# Patient Record
Sex: Female | Born: 1950
Health system: Southern US, Community
[De-identification: ages and names within clinical notes are randomized; demographics above are authoritative.]

## PROBLEM LIST (undated history)

## (undated) DIAGNOSIS — T7840XA Allergy, unspecified, initial encounter: Secondary | ICD-10-CM

## (undated) DIAGNOSIS — D509 Iron deficiency anemia, unspecified: Secondary | ICD-10-CM

## (undated) DIAGNOSIS — E279 Disorder of adrenal gland, unspecified: Secondary | ICD-10-CM

## (undated) DIAGNOSIS — E559 Vitamin D deficiency, unspecified: Secondary | ICD-10-CM

## (undated) DIAGNOSIS — M1990 Unspecified osteoarthritis, unspecified site: Secondary | ICD-10-CM

## (undated) DIAGNOSIS — G4730 Sleep apnea, unspecified: Secondary | ICD-10-CM

## (undated) DIAGNOSIS — H269 Unspecified cataract: Secondary | ICD-10-CM

## (undated) DIAGNOSIS — E7210 Disorders of sulfur-bearing amino-acid metabolism, unspecified: Secondary | ICD-10-CM

## (undated) DIAGNOSIS — B059 Measles without complication: Secondary | ICD-10-CM

## (undated) DIAGNOSIS — B019 Varicella without complication: Secondary | ICD-10-CM

## (undated) DIAGNOSIS — K8020 Calculus of gallbladder without cholecystitis without obstruction: Secondary | ICD-10-CM

## (undated) DIAGNOSIS — M5430 Sciatica, unspecified side: Secondary | ICD-10-CM

## (undated) DIAGNOSIS — S93401D Sprain of unspecified ligament of right ankle, subsequent encounter: Secondary | ICD-10-CM

## (undated) DIAGNOSIS — M539 Dorsopathy, unspecified: Secondary | ICD-10-CM

## (undated) DIAGNOSIS — Z8489 Family history of other specified conditions: Secondary | ICD-10-CM

## (undated) DIAGNOSIS — I10 Essential (primary) hypertension: Secondary | ICD-10-CM

## (undated) DIAGNOSIS — L989 Disorder of the skin and subcutaneous tissue, unspecified: Secondary | ICD-10-CM

## (undated) DIAGNOSIS — B269 Mumps without complication: Secondary | ICD-10-CM

## (undated) DIAGNOSIS — S62319A Displaced fracture of base of unspecified metacarpal bone, initial encounter for closed fracture: Secondary | ICD-10-CM

## (undated) DIAGNOSIS — G4700 Insomnia, unspecified: Secondary | ICD-10-CM

## (undated) DIAGNOSIS — R931 Abnormal findings on diagnostic imaging of heart and coronary circulation: Secondary | ICD-10-CM

## (undated) DIAGNOSIS — E785 Hyperlipidemia, unspecified: Secondary | ICD-10-CM

## (undated) HISTORY — DX: Sprain of unspecified ligament of right ankle, subsequent encounter: S93.401D

## (undated) HISTORY — DX: Dorsopathy, unspecified: M53.9

## (undated) HISTORY — DX: Unspecified cataract: H26.9

## (undated) HISTORY — DX: Vitamin D deficiency, unspecified: E55.9

## (undated) HISTORY — DX: Abnormal findings on diagnostic imaging of heart and coronary circulation: R93.1

## (undated) HISTORY — DX: Measles without complication: B05.9

## (undated) HISTORY — DX: Allergy, unspecified, initial encounter: T78.40XA

## (undated) HISTORY — DX: Displaced fracture of base of unspecified metacarpal bone, initial encounter for closed fracture: S62.319A

## (undated) HISTORY — DX: Calculus of gallbladder without cholecystitis without obstruction: K80.20

## (undated) HISTORY — DX: Essential (primary) hypertension: I10

## (undated) HISTORY — DX: Disorders of sulfur-bearing amino-acid metabolism, unspecified: E72.10

## (undated) HISTORY — DX: Iron deficiency anemia, unspecified: D50.9

## (undated) HISTORY — DX: Unspecified osteoarthritis, unspecified site: M19.90

## (undated) HISTORY — DX: Hyperlipidemia, unspecified: E78.5

## (undated) HISTORY — DX: Sciatica, unspecified side: M54.30

## (undated) HISTORY — DX: Mumps without complication: B26.9

## (undated) HISTORY — PX: NO PAST SURGERIES: SHX2092

## (undated) HISTORY — DX: Disorder of adrenal gland, unspecified: E27.9

## (undated) HISTORY — DX: Insomnia, unspecified: G47.00

## (undated) HISTORY — DX: Disorder of the skin and subcutaneous tissue, unspecified: L98.9

## (undated) HISTORY — DX: Varicella without complication: B01.9

---

## 2012-03-06 ENCOUNTER — Ambulatory Visit (INDEPENDENT_AMBULATORY_CARE_PROVIDER_SITE_OTHER): Payer: BC Managed Care – PPO | Admitting: Gynecology

## 2012-03-06 ENCOUNTER — Encounter: Payer: Self-pay | Admitting: Gynecology

## 2012-03-06 ENCOUNTER — Other Ambulatory Visit (HOSPITAL_COMMUNITY)
Admission: RE | Admit: 2012-03-06 | Discharge: 2012-03-06 | Disposition: A | Discharge status: Home / Self Care | Payer: BC Managed Care – PPO | Source: Ambulatory Visit | Attending: Gynecology | Admitting: Gynecology

## 2012-03-06 VITALS — BP 148/84 | Ht 67.0 in | Wt 172.0 lb

## 2012-03-06 DIAGNOSIS — N643 Galactorrhea not associated with childbirth: Secondary | ICD-10-CM

## 2012-03-06 DIAGNOSIS — Z01419 Encounter for gynecological examination (general) (routine) without abnormal findings: Secondary | ICD-10-CM | POA: Insufficient documentation

## 2012-03-06 DIAGNOSIS — Z13220 Encounter for screening for lipoid disorders: Secondary | ICD-10-CM

## 2012-03-06 DIAGNOSIS — Z780 Asymptomatic menopausal state: Secondary | ICD-10-CM

## 2012-03-06 DIAGNOSIS — Z131 Encounter for screening for diabetes mellitus: Secondary | ICD-10-CM

## 2012-03-06 DIAGNOSIS — Z1151 Encounter for screening for human papillomavirus (HPV): Secondary | ICD-10-CM | POA: Insufficient documentation

## 2012-03-06 LAB — CBC WITH DIFFERENTIAL/PLATELET
Basophils Absolute: 0 10*3/uL (ref 0.0–0.1)
Basophils Relative: 1 % (ref 0–1)
Eosinophils Relative: 3 % (ref 0–5)
HCT: 39.5 % (ref 36.0–46.0)
Hemoglobin: 13.2 g/dL (ref 12.0–15.0)
MCH: 28.2 pg (ref 26.0–34.0)
MCHC: 33.4 g/dL (ref 30.0–36.0)
MCV: 84.4 fL (ref 78.0–100.0)
Monocytes Absolute: 0.3 10*3/uL (ref 0.1–1.0)
Monocytes Relative: 8 % (ref 3–12)
RDW: 13.6 % (ref 11.5–15.5)

## 2012-03-06 LAB — COMPREHENSIVE METABOLIC PANEL
AST: 205 U/L — ABNORMAL HIGH (ref 0–37)
Albumin: 4.6 g/dL (ref 3.5–5.2)
Alkaline Phosphatase: 277 U/L — ABNORMAL HIGH (ref 39–117)
BUN: 11 mg/dL (ref 6–23)
Calcium: 9.7 mg/dL (ref 8.4–10.5)
Creat: 0.78 mg/dL (ref 0.50–1.10)
Glucose, Bld: 126 mg/dL — ABNORMAL HIGH (ref 70–99)
Potassium: 3.7 mEq/L (ref 3.5–5.3)

## 2012-03-06 LAB — LIPID PANEL
Cholesterol: 215 mg/dL — ABNORMAL HIGH (ref 0–200)
HDL: 75 mg/dL (ref >39)
Triglycerides: 131 mg/dL (ref <150)

## 2012-03-06 NOTE — Patient Instructions (Signed)
Office will call you to arrange mammogram/ultrasound. Schedule screening colonoscopy. Follow up in one year for annual gynecologic exam.

## 2012-03-06 NOTE — Progress Notes (Signed)
Melinda [name] [birth year] [id number]        61 y.o.  G2P2 new patient for annual exam.  Former patient of Dr. Angeline Slim. Several issues noted below.  Past medical history,surgical history, medications, allergies, family history and social history were all reviewed and documented in the EPIC chart. ROS:  Was performed and pertinent positives and negatives are included in the history.  Exam: Sherrilyn Rist assistant Filed Vitals:   03/06/12 1541  BP: 148/84  Height: 5\' 7"  (1.702 m)  Weight: 172 lb (78.019 kg)   General appearance  Normal Skin grossly normal Head/Neck normal with no cervical or supraclavicular adenopathy thyroid normal Lungs  clear Cardiac RR, without RMG Abdominal  soft, nontender, without masses, organomegaly or hernia Breasts  examined lying and sitting. Right without masses, retractions, discharge or axillary adenopathy. Left with clear galactorrhea, no masses retractions adenopathy. Pelvic  Ext/BUS/vagina  normal   Cervix  normal Pap/HPV  Uterus  axial, normal size, shape and contour, midline and mobile nontender   Adnexa  Without masses or tenderness    Anus and perineum  normal   Rectovaginal  normal sphincter tone without palpated masses or tenderness.    Assessment/Plan:  61 y.o. G2P2 female for annual exam.   1. Postmenopausal. Without bleeding in over 2 years. Initially with menopausal symptoms but these seemed to have almost resolved. Patient will monitor present as they're not overly bothersome to her. 2. History of left intraductal defect on ultrasound 2005. Patient had mammogram/ultrasound was found to have a dilated duct with an intra-ductal defect most compatible with an intraductal papilloma. She was counseled for excision and declined it was recommended for repeat ultrasound in 3-6 months which she never followed up on. I reviewed the issues and risks to include intraductal carcinoma and strongly recommended she schedule a mammogram ultrasound now and she  agrees we will make these arrangements with her. She does have clear galactorrhea from the left nipple which she has had for a number of years and I am going to also get a baseline prolactin.  The absolute need for follow up reviewed. 3. Pap smear. Pap/HPV screen done today. Last Pap smear 2011. No history of abnormal Pap smears before. Assuming this Pap normal then we'll plan every 5 year screening. 4. Colonoscopy. Patient has never had a colonoscopy. I reviewed current screening guidelines and strongly recommended she schedule this I gave her names of facilities in [place]. 5. DEXA. Patient underwent menopause around age 61. Plan Baseline DEXA age 60-65. Increase calcium vitamin D reviewed. 6. Dark urine. Patient reports transient dark urine. Now reportedly normal. Check baseline urinalysis comprehensive metabolic panel for renal function/bilirubin. 7. Elevated blood pressure.  Blood pressure 148/84. Patient reports white coat syndrome with elevated blood pressures at doctor's offices. I recommended recheck in a non-exam situation, as long as normal will follow. If it would remain elevated the need for follow up discussed. 8. Health maintenance.  Patient does not have a primary physician. I gave her the name of several physicians in [place]. Will check baseline CBC lipid profile comprehensive metabolic panel TSH vitamin D and urinalysis.  Patient will follow up in one year, sooner as needed.    Dara Lords MD, 4:16 PM 03/06/2012

## 2012-03-07 ENCOUNTER — Telehealth: Payer: Self-pay | Admitting: Gynecology

## 2012-03-07 DIAGNOSIS — K759 Inflammatory liver disease, unspecified: Secondary | ICD-10-CM

## 2012-03-07 LAB — URINALYSIS W MICROSCOPIC + REFLEX CULTURE
Bilirubin Urine: NEGATIVE
Casts: NONE SEEN
Crystals: NONE SEEN
Ketones, ur: NEGATIVE mg/dL
Nitrite: NEGATIVE
Specific Gravity, Urine: 1.011 (ref 1.005–1.030)
Urobilinogen, UA: 0.2 mg/dL (ref 0.0–1.0)
pH: 6.5 (ref 5.0–8.0)

## 2012-03-07 NOTE — Telephone Encounter (Signed)
Call patient: #1 glucose value slightly elevated could be consistent with early diabetes or just random. Recommend repeat a fasting glucose. #2 cholesterol and LDL are borderline elevated. Recommend repeating a fasting value when she does a fasting glucose. #3 liver functions are elevated which is more concerning. Possibilities include hepatitis, gallbladder disease or other issues. This definitely needs to be followed up. I recommend she come back in to have a repeat of her liver functions, viral hepatitis blood tests and to schedule her for an ultrasound of the liver and gallbladder. Ultimately she probably will need to see a gastroenterologist for further evaluation but I wanted these baseline studies. Have her come back in for the blood work and schedule her an ultrasound of the liver/gallbladder reference elevated liver function studies.  She can come in fasting and we can do all 3 of the above studies.

## 2012-03-08 ENCOUNTER — Telehealth: Payer: Self-pay | Admitting: *Deleted

## 2012-03-08 DIAGNOSIS — N643 Galactorrhea not associated with childbirth: Secondary | ICD-10-CM

## 2012-03-08 LAB — URINE CULTURE

## 2012-03-08 NOTE — Telephone Encounter (Signed)
Orders placed in computer system.

## 2012-03-08 NOTE — Telephone Encounter (Signed)
Spoke with pt regarding the below, orders placed for repeat glucose and lipid. Pt will call back to check with insurance to scheduled ultrasound.

## 2012-03-08 NOTE — Telephone Encounter (Signed)
Message copied by Aura Camps on Thu Mar 08, 2012 11:50 AM ------      Message from: Libby Maw      Created: Tue Mar 06, 2012  4:31 PM                   ----- Message -----         From: Dara Lords, MD         Sent: 03/06/2012   4:27 PM           To: Amy Duwaine Maxin, CNA            Schedule diagnostic mammogram and ultrasound at breast center reference prior left dilated duct with solid filling defect 2005 with no follow up since.  Clear galactorrhea from that nipple. Mammogram report from [place] sent to [facility] for scanning in. Recommend sending report to breast Center when scheduling so they can see what was described.

## 2012-03-20 NOTE — Telephone Encounter (Signed)
Pt breast center appointment on 03/26/12 @ 1:10 pm

## 2012-03-26 ENCOUNTER — Ambulatory Visit
Admission: RE | Admit: 2012-03-26 | Discharge: 2012-03-26 | Disposition: A | Discharge status: Home / Self Care | Payer: BC Managed Care – PPO | Source: Ambulatory Visit | Attending: Gynecology | Admitting: Gynecology

## 2012-03-26 ENCOUNTER — Other Ambulatory Visit: Payer: Self-pay | Admitting: Gynecology

## 2012-03-26 DIAGNOSIS — N643 Galactorrhea not associated with childbirth: Secondary | ICD-10-CM

## 2012-03-30 ENCOUNTER — Other Ambulatory Visit: Payer: BC Managed Care – PPO

## 2012-03-30 DIAGNOSIS — K759 Inflammatory liver disease, unspecified: Secondary | ICD-10-CM

## 2012-03-30 LAB — COMPREHENSIVE METABOLIC PANEL
CO2: 27 mEq/L (ref 19–32)
Calcium: 9.5 mg/dL (ref 8.4–10.5)
Creat: 0.76 mg/dL (ref 0.50–1.10)
Glucose, Bld: 81 mg/dL (ref 70–99)
Sodium: 139 mEq/L (ref 135–145)
Total Bilirubin: 0.9 mg/dL (ref 0.3–1.2)
Total Protein: 7 g/dL (ref 6.0–8.3)

## 2012-03-30 LAB — LIPID PANEL
Cholesterol: 214 mg/dL — ABNORMAL HIGH (ref 0–200)
Triglycerides: 89 mg/dL (ref <150)
VLDL: 18 mg/dL (ref 0–40)

## 2012-03-31 LAB — HEPATITIS A ANTIBODY, IGM: Hep A IgM: NEGATIVE

## 2012-04-02 ENCOUNTER — Other Ambulatory Visit: Payer: Self-pay | Admitting: Gynecology

## 2012-04-02 DIAGNOSIS — E7800 Pure hypercholesterolemia, unspecified: Secondary | ICD-10-CM

## 2012-04-20 ENCOUNTER — Encounter: Payer: Self-pay | Admitting: Gynecology

## 2012-05-09 ENCOUNTER — Telehealth: Payer: Self-pay | Admitting: *Deleted

## 2012-05-09 NOTE — Telephone Encounter (Signed)
I called patient regarding an abnormal diagnostic mammo and ultrasound that revealed she needed a breast biopsy. The patient was informed by the Breast Center for the need and never scheduled. We are following up with the patient since a biopsy has never been scheduled. Melinda [name] stated to me that she does not feel the biopsy is needed and is not going to proceed with the biopsy. I asked her if she informed the Breast Center she said yes but doesn't have any follow up set up. Please advise.

## 2012-05-14 ENCOUNTER — Telehealth: Payer: Self-pay | Admitting: *Deleted

## 2012-05-14 NOTE — Telephone Encounter (Signed)
Message copied by Aura Camps on Mon May 14, 2012  2:45 PM ------      Message from: Dara Lords      Created: Mon May 14, 2012  1:02 PM       Get patient on phone for me please and if I am in with a patient get me out of the room.

## 2012-05-14 NOTE — Telephone Encounter (Signed)
Left message on pt voicemail to call 

## 2012-05-15 NOTE — Telephone Encounter (Signed)
Pt called about at lunch time, left message to  call back.

## 2012-05-15 NOTE — Telephone Encounter (Signed)
Got pt on phone for TF.

## 2012-05-15 NOTE — Telephone Encounter (Signed)
Patient had mammogram in August 2013 which showed:  Intraductal mass with calcifications in the 9 o'clock position of the left breast, 2 cm from the left nipple.  BI-RADS CATEGORY 4: Suspicious abnormality - biopsy should be considered.  We subsequently tried to call her several times to make sure that a biopsy was arranged and were unable to get her on the phone. We contacted the mammogram facility and they also were unable to contact her. We sent her a certified letter signed for and documented in the Epic system reviewing the findings that she may have breast cancer and that we recommended she follow up for the biopsy.  Melinda [name] followed up with a phone call 05/09/2012 again reviewing the findings and our recommendation for biopsy. The patient stated that she does not feel that she needed a biopsy.   I personally called her today and reviewed the situation and my recommendation that she have the biopsy. She clearly understands that the mammogram suggests breast cancer and a biopsy is needed to rule this in or out and that if indeed she does have breast cancer this needs to be treated as early treatment could lead to a cure and if she postpones this she could die from the breast cancer. She is using a mineral supplement called Miracle Mineral Solution and she is convinced that she does not have cancer because she is not having a reaction to the solution.  The patient clearly understands the mammogram findings and the recommended need for biopsy and she refuses this. I asked her to call me at any time if she changes her mind or has any questions related to this.

## 2012-05-23 ENCOUNTER — Ambulatory Visit (INDEPENDENT_AMBULATORY_CARE_PROVIDER_SITE_OTHER): Payer: BC Managed Care – PPO | Admitting: Gynecology

## 2012-05-23 ENCOUNTER — Encounter: Payer: Self-pay | Admitting: Gynecology

## 2012-05-23 DIAGNOSIS — R82998 Other abnormal findings in urine: Secondary | ICD-10-CM

## 2012-05-23 DIAGNOSIS — R1011 Right upper quadrant pain: Secondary | ICD-10-CM

## 2012-05-23 DIAGNOSIS — R3989 Other symptoms and signs involving the genitourinary system: Secondary | ICD-10-CM

## 2012-05-23 NOTE — Patient Instructions (Signed)
Office will contact you to schedule ultrasound of the liver area. Office will follow up with you in reference to these results and the blood work.

## 2012-05-23 NOTE — Progress Notes (Signed)
Patient presents with 4 separate episodes of midepigastric pain, nausea with episode of vomiting following eating over the past several weeks. History of elevated LFTs on comprehensive metabolic panel in July with repeat normal.  No lower GI symptoms such as constipation diarrhea or lower abdominal pain. Also notes that her urine seems to be on the darker side.  Had recently had mammogram in August which showed area they recommended biopsy. As per 10/8 2013 note patient declined recommendation from the breast Center, our office staff and me personally to proceed with biopsy.  Exam Spine straight without CVA tenderness Abdomen with mild epigastric discomfort to deep palpation. No rebound guarding masses organomegaly. No right upper quadrant liver palpable or pain. Active bowel sounds throughout.  Assessment and plan: 1. Epigastric discomfort following meals with pain/nausea and vomiting. Recent mild elevated LFTs repeat normal. Suspect gallbladder disease. We'll start with repeat comprehensive metabolic panel today and ultrasound right upper quadrant area various scenarios reviewed. Gallstones polyps or other abnormalities recommend general surgical follow up. Patient did state that she would not have her gallbladder removed that she would prefer dietary changes. I reviewed the risks to include gangrenous cholecystitis in the emergency nature of this and we'll further discuss pending the ultrasound blood results. If normal recommend follow up with gastroenterology. Possible reflux esophagitis or other GI abnormalities discussed. 2. Suspicious mammogram biopsy recommended. I again reviewed this with the patient and she again clearly understands the issues/implications and refuses further follow up. This again has been outlined in my 05/15/2012 note.

## 2012-05-24 ENCOUNTER — Telehealth: Payer: Self-pay | Admitting: *Deleted

## 2012-05-24 DIAGNOSIS — R1013 Epigastric pain: Secondary | ICD-10-CM

## 2012-05-24 LAB — URINALYSIS W MICROSCOPIC + REFLEX CULTURE
Bacteria, UA: NONE SEEN
Casts: NONE SEEN
Crystals: NONE SEEN
Ketones, ur: NEGATIVE mg/dL
Nitrite: NEGATIVE
Specific Gravity, Urine: 1.008 (ref 1.005–1.030)
pH: 7.5 (ref 5.0–8.0)

## 2012-05-24 LAB — COMPREHENSIVE METABOLIC PANEL
ALT: 567 U/L — ABNORMAL HIGH (ref 0–35)
BUN: 8 mg/dL (ref 6–23)
CO2: 29 mEq/L (ref 19–32)
Creat: 0.74 mg/dL (ref 0.50–1.10)
Total Bilirubin: 6.3 mg/dL — ABNORMAL HIGH (ref 0.3–1.2)

## 2012-05-24 NOTE — Telephone Encounter (Signed)
Left message for pt to call. Ultrasound appointment at [facility] hospital 05/29/12 @ 8:00 am pt must be NPO after midnight.

## 2012-05-24 NOTE — Telephone Encounter (Signed)
Message copied by Aura Camps on Thu May 24, 2012  8:50 AM ------      Message from: Dara Lords      Created: Wed May 23, 2012  4:49 PM       Schedule right upper quadrant ultrasound reference epigastric/right upper quadrant pain suspicious for gallbladder disease. History of elevated LFTs

## 2012-05-25 ENCOUNTER — Ambulatory Visit (HOSPITAL_COMMUNITY)
Admission: RE | Admit: 2012-05-25 | Discharge: 2012-05-25 | Disposition: A | Discharge status: Home / Self Care | Payer: BC Managed Care – PPO | Source: Ambulatory Visit | Attending: Gynecology | Admitting: Gynecology

## 2012-05-25 ENCOUNTER — Telehealth: Payer: Self-pay | Admitting: Gynecology

## 2012-05-25 DIAGNOSIS — R1013 Epigastric pain: Secondary | ICD-10-CM

## 2012-05-25 DIAGNOSIS — R112 Nausea with vomiting, unspecified: Secondary | ICD-10-CM | POA: Insufficient documentation

## 2012-05-25 DIAGNOSIS — K8020 Calculus of gallbladder without cholecystitis without obstruction: Secondary | ICD-10-CM | POA: Insufficient documentation

## 2012-05-25 LAB — URINE CULTURE: Colony Count: 35000

## 2012-05-25 NOTE — Telephone Encounter (Signed)
Read the report to the pt and she stated " Im going to hold off on making appointment with general surgeon doctor".

## 2012-05-25 NOTE — Telephone Encounter (Signed)
Pt informed with the below note, pt asked how many gallstones does she have? What are the size?

## 2012-05-25 NOTE — Telephone Encounter (Signed)
Pt informed with the below note. 

## 2012-05-25 NOTE — Telephone Encounter (Signed)
Tell patient that her ultrasound does confirm gallstones and her liver functions and bilirubin are elevated. Recommend that she see Gen. Surgery to at least hear what is involved with cholecystectomy. As I discussed at her office visit if she does not have this removed she will continue to have attacks which could lead to a gangrenous gallbladder which could be life-threatening.  Schedule an appointment with Gen. Surgery and encourage the patient to follow up for this.

## 2012-05-25 NOTE — Telephone Encounter (Signed)
Read her the report.

## 2012-05-29 ENCOUNTER — Ambulatory Visit (HOSPITAL_COMMUNITY): Payer: BC Managed Care – PPO

## 2014-06-09 ENCOUNTER — Encounter: Payer: Self-pay | Admitting: Gynecology

## 2015-10-24 ENCOUNTER — Ambulatory Visit: Payer: Worker's Compensation

## 2015-10-24 ENCOUNTER — Ambulatory Visit (INDEPENDENT_AMBULATORY_CARE_PROVIDER_SITE_OTHER): Payer: Worker's Compensation | Admitting: Family Medicine

## 2015-10-24 VITALS — BP 134/76 | HR 65 | Temp 97.6°F | Resp 12 | Ht 67.0 in | Wt 172.0 lb

## 2015-10-24 DIAGNOSIS — M25562 Pain in left knee: Secondary | ICD-10-CM

## 2015-10-24 DIAGNOSIS — M25572 Pain in left ankle and joints of left foot: Secondary | ICD-10-CM | POA: Diagnosis not present

## 2015-10-24 DIAGNOSIS — M79672 Pain in left foot: Secondary | ICD-10-CM

## 2015-10-24 DIAGNOSIS — S82832A Other fracture of upper and lower end of left fibula, initial encounter for closed fracture: Secondary | ICD-10-CM

## 2015-10-24 NOTE — Patient Instructions (Addendum)
IF you received an x-ray today, you will receive an invoice from [facility]. Please contact [facility] at [phone] with questions or concerns regarding your invoice.   IF you received labwork today, you will receive an invoice from [employer]. Please contact [facility] at [phone] with questions or concerns regarding your invoice.   Our billing staff will not be able to assist you with questions regarding bills from these companies.  You will be contacted with the lab results as soon as they are available. The fastest way to get your results is to activate your My Chart account. Instructions are located on the last page of this paperwork. If you have not heard from [place] regarding the results in 2 weeks, please contact this office.     On your x-rays, there did not appear to be any acute fractures, but as we discussed, sometimes there is a subtle fracture not seen on initial exam. Based on the location of your pain in the foot, should wear the walking boot, crutches if needed if pain with weightbearing, and follow-up in 3 days to recheck.   You did have an avulsion or slight fracture from the outside anklebone that appears to be from previous injury. Depending on your symptoms from current injury, you may need to be seen by an orthopedic surgeon. Follow-up in 3 days as planned.   In the meantime, see information below on treatment for acute injuries. Okay to use Tylenol or Advil if needed. Ice and elevate for the first few days - ice on and off for 10 minutes at a time.   Ankle Sprain An ankle sprain is an injury to the strong, fibrous tissues (ligaments) that hold the bones of your ankle joint together.  CAUSES An ankle sprain is usually caused by a fall or by twisting your ankle. Ankle sprains most commonly occur when you step on the outer edge of your foot, and your ankle turns inward. People who participate in sports are more prone to  these types of injuries.  SYMPTOMS   Pain in your ankle. The pain may be present at rest or only when you are trying to stand or walk.  Swelling.  Bruising. Bruising may develop immediately or within 1 to 2 days after your injury.  Difficulty standing or walking, particularly when turning corners or changing directions. DIAGNOSIS  Your caregiver will ask you details about your injury and perform a physical exam of your ankle to determine if you have an ankle sprain. During the physical exam, your caregiver will press on and apply pressure to specific areas of your foot and ankle. Your caregiver will try to move your ankle in certain ways. An X-ray exam may be done to be sure a bone was not broken or a ligament did not separate from one of the bones in your ankle (avulsion fracture).  TREATMENT  Certain types of braces can help stabilize your ankle. Your caregiver can make a recommendation for this. Your caregiver may recommend the use of medicine for pain. If your sprain is severe, your caregiver may refer you to a surgeon who helps to restore function to parts of your skeletal system (orthopedist) or a physical therapist. [place] ice to your injury for 1-2 days or as directed by your caregiver. Applying ice helps to reduce inflammation and pain.  Put ice in a plastic bag.  Place a towel between your skin and the bag.  Leave  the ice on for 15-20 minutes at a time, every 2 hours while you are awake.  Only take over-the-counter or prescription medicines for pain, discomfort, or fever as directed by your caregiver.  Elevate your injured ankle above the level of your heart as much as possible for 2-3 days.  If your caregiver recommends crutches, use them as instructed. Gradually put weight on the affected ankle. Continue to use crutches or a cane until you can walk without feeling pain in your ankle.  If you have a plaster splint, wear the splint as directed by your  caregiver. Do not rest it on anything harder than a pillow for the first 24 hours. Do not put weight on it. Do not get it wet. You may take it off to take a shower or bath.  You may have been given an elastic bandage to wear around your ankle to provide support. If the elastic bandage is too tight (you have numbness or tingling in your foot or your foot becomes cold and blue), adjust the bandage to make it comfortable.  If you have an air splint, you may blow more air into it or let air out to make it more comfortable. You may take your splint off at night and before taking a shower or bath. Wiggle your toes in the splint several times per day to decrease swelling. SEEK MEDICAL CARE IF:   You have rapidly increasing bruising or swelling.  Your toes feel extremely cold or you lose feeling in your foot.  Your pain is not relieved with medicine. SEEK IMMEDIATE MEDICAL CARE IF:  Your toes are numb or blue.  You have severe pain that is increasing. MAKE SURE YOU:   Understand these instructions.  Will watch your condition.  Will get help right away if you are not doing well or get worse.   This information is not intended to replace advice given to you by your health care provider. Make sure you discuss any questions you have with your health care provider.   Document Released: 07/25/2005 Document Revised: 08/15/2014 Document Reviewed: 08/06/2011 Elsevier Interactive Patient Education 2016 [place] for Routine Care of Injuries Theroutine careofmanyinjuriesincludes rest, ice, compression, and elevation (RICE therapy). RICE therapy is often recommended for injuries to soft tissues, such as a muscle strain, ligament injuries, bruises, and overuse injuries. It can also be used for some bony injuries. Using RICE therapy can help to relieve pain, lessen swelling, and enable your body to heal. Rest Rest is required to allow your body to heal. This usually involves reducing your  normal activities and avoiding use of the injured part of your body. Generally, you can return to your normal activities when you are comfortable and have been given permission by your health care provider. Ice Icing your injury helps to keep the swelling down, and it lessens pain. Do not apply ice directly to your skin.  Put ice in a plastic bag.  Place a towel between your skin and the bag.  Leave the ice on for 20 minutes, 2-3 times a day. Do this for as long as you are directed by your health care provider. Compression Compression means putting pressure on the injured area. Compression helps to keep swelling down, gives support, and helps with discomfort. Compression may be done with an elastic bandage. If an elastic bandage has been applied, follow these general tips:  Remove and reapply the bandage every 3-4 hours or as directed by your health care provider.  Make sure the bandage is not wrapped too tightly, because this can cut off circulation. If part of your body beyond the bandage becomes blue, numb, cold, swollen, or more painful, your bandage is most likely too tight. If this occurs, remove your bandage and reapply it more loosely.  See your health care provider if the bandage seems to be making your problems worse rather than better. Elevation Elevation means keeping the injured area raised. This helps to lessen swelling and decrease pain. If possible, your injured area should be elevated at or above the level of your heart or the center of your chest. [place]? You should seek medical care if:  Your pain and swelling continue.  Your symptoms are getting worse rather than improving. These symptoms may indicate that further evaluation or further X-rays are needed. Sometimes, X-rays may not show a small broken bone (fracture) until a number of days later. Make a follow-up appointment with your health care provider. WHEN SHOULD I SEEK IMMEDIATE MEDICAL  CARE? You should seek immediate medical care if:  You have sudden severe pain at or below the area of your injury.  You have redness or increased swelling around your injury.  You have tingling or numbness at or below the area of your injury that does not improve after you remove the elastic bandage.   This information is not intended to replace advice given to you by your health care provider. Make sure you discuss any questions you have with your health care provider.   Document Released: 11/06/2000 Document Revised: 04/15/2015 Document Reviewed: 07/02/2014 Elsevier Interactive Patient Education [employer].

## 2015-10-24 NOTE — Progress Notes (Addendum)
Subjective:    Patient ID: Melinda [name], female    DOB: [birth year], 65 y.o.   MRN: [id number] By signing my name below, I, Judithe Modest, attest that this documentation has been prepared under the direction and in the presence of [name], MD. Electronically Signed: Judithe Modest, ER Scribe. 10/24/2015. 12:21 PM.  Chief Complaint  Patient presents with  . Ankle Injury    left ankle injury yesterday sliped on water    HPI HPI Comments: Melinda [name] is a 65 y.o. female who presents to [facility] complaining of a left ankle injury at work yesterday. She slipped on a wet floor and fell down onto her left knee and bent her toes back. In December she sprained her left ankle, and she was not 100% healed when she had her injury yesterday, though she was very close. She was having occasional mild pain in her ankle before yesterday. She was able to do all her normal activities, other than run. She tried running one month ago but had some soreness after she ran, so she was giving it a little more time to heal before she ran regularly again. She had two [place] treatments and several acupuncture treatments for the anke sprain.   There are no active problems to display for this patient.  History reviewed. No pertinent past medical history. History reviewed. No pertinent past surgical history. No Known Allergies Prior to Admission medications   Medication Sig Start Date End Date Taking? Authorizing Provider  cholecalciferol (VITAMIN D) 1000 UNITS tablet Take 1,000 Units by mouth daily.   Yes Historical Provider, MD  Docosahexaenoic Acid (DHA OMEGA 3 PO) Take by mouth.   Yes Historical Provider, MD  Multiple Vitamin (MULTIVITAMIN) tablet Take 1 tablet by mouth daily.   Yes Historical Provider, MD  Nutritional Supplements (DETOX ENZYME FORMULA PO) Take by mouth. Prn   Yes Historical Provider, MD   Social History   Social History  . Marital Status: Married    Spouse Name: N/A  . Number  of Children: N/A  . Years of Education: N/A   Occupational History  . Not on file.   Social History Main Topics  . Smoking status: Never Smoker   . Smokeless tobacco: Never Used  . Alcohol Use: No  . Drug Use: No  . Sexual Activity: No   Other Topics Concern  . Not on file   Social History Narrative    Review of Systems  Constitutional: Negative for fever and chills.  Musculoskeletal: Positive for arthralgias and gait problem.  Neurological: Positive for weakness (Secondary to pain). Negative for numbness.      Objective:  BP 134/76 mmHg  Pulse 65  Temp(Src) 97.6 F (36.4 C) (Oral)  Resp 12  Ht 5\' 7"  (1.702 m)  Wt 172 lb (78.019 kg)  BMI 26.93 kg/m2  SpO2 98%  LMP 08/04/2009  Physical Exam  Constitutional: She is oriented to person, place, and time. She appears well-developed and well-nourished. No distress.  HENT:  Head: Normocephalic and atraumatic.  Eyes: Pupils are equal, round, and reactive to light.  Neck: Neck supple.  Cardiovascular: Normal rate.   Pulmonary/Chest: Effort normal. No respiratory distress.  Musculoskeletal: Normal range of motion. She exhibits edema and tenderness.  Left foot with full ROM. TTP along tibial tubercle with faint ecchymosis but skin is intact. No effusion of the knee. Negative varis and valgus. Negative mcmurrays. Negative Lachman. Slight discomfort over the lower medial malleolus. Navicula non tender.  TTP along proximal fifth metatarsal. Slight discomfort over the distal aspect of the first metatarsal. Minimal discomfort over the metatarsal heads. Tender along the first MTP and great toe head. Ecchymosis along the distal foot and the great toe through the fifth toes. DP pulse 2+. NVI distally cap refill less than two seconds.   Neurological: She is alert and oriented to person, place, and time. Coordination normal.  Skin: Skin is warm and dry. She is not diaphoretic.  Psychiatric: She has a normal mood and affect. Her behavior is  normal.  Nursing note and vitals reviewed. Uncomfortable, but able to extend and flex toes independently on left foot.    Dg Knee 1-2 Views Left  10/24/2015  CLINICAL DATA:  Pain following fall 1 day prior EXAM: LEFT KNEE - 1-2 VIEW COMPARISON:  None. FINDINGS: Frontal and lateral views were obtained. There is no fracture or dislocation. There is equivocal medial patellar subluxation. No appreciable joint effusion. The joint spaces appear normal. No erosive change. IMPRESSION: No apparent fracture or joint effusion. Rather equivocal medial patellar subluxation. No appreciable arthropathic change. Electronically Signed   By: Lowella Grip III M.D.   On: 10/24/2015 12:58   Dg Ankle Complete Left  10/24/2015  CLINICAL DATA:  Pain after trauma EXAM: LEFT ANKLE COMPLETE - 3+ VIEW COMPARISON:  None FINDINGS: Mild irregularity of the distal fibula without overlying soft tissue swelling. No other abnormalities. IMPRESSION: Probable remote avulsion injury off the distal fibula. The lack of overlying soft tissue swelling would argue against acute injury in this region. No other acute abnormalities. Electronically Signed   By: Dorise Bullion III M.D   On: 10/24/2015 13:00   Dg Foot Complete Left  10/24/2015  CLINICAL DATA:  Pain following fall 1 day prior EXAM: LEFT FOOT - COMPLETE 3+ VIEW COMPARISON:  None. FINDINGS: Frontal, oblique, and lateral views were obtained. There is no fracture or dislocation. The joint spaces appear unremarkable. No erosive change. There is a spur arising from the posterior calcaneus. There is mild pes cavus. IMPRESSION: No fracture or dislocation. No appreciable joint space narrowing. Mild pes cavus. There is a posterior calcaneal spur. Electronically Signed   By: Lowella Grip III M.D.   On: 10/24/2015 12:57        Assessment & Plan:  Melinda [name] is a 65 y.o. female Left foot pain - Plan: DG Foot Complete Left, Apply cam walker  Left ankle pain - Plan: DG Ankle  Complete Left, Apply cam walker  Left knee pain - Plan: DG Knee 1-2 Views Left  Closed avulsion fracture of distal fibula, left, initial encounter  Previous ankle sprain with probable fibula avulsion fracture from previous injury, now with new injury at work yesterday with slip and fall, Contusion to anterior left knee, bruising of foot and tenderness over fifth metatarsal concerning for possible occult fracture, versus occult midfoot fracture.   -Initially attempted to place an tall Cam Walker based on her injury, but she was not able to tolerate this, stating that the strapping and structure across the top of her foot was too uncomfortable. Did try postop shoe but discussed this would not provide the stability that the Cam Walker with. Again discussed if any pain with weightbearing, should be on crutches and nonweightbearing until follow-up visit. If she is pain-free with walking in postop shoe, okay to use this until follow-up visit.   - Ice and elevation, and recheck in 6 days. If she would like to try to schedule  appointment with me on Wednesday, I can see her if one is available. Otherwise can follow-up with any provider next visit to recheck ankle/foot.  Over-the-counter Tylenol or Advil if needed, work restrictions provided. RTC precautions discussed.  -With her ankle pain, and previous injury, may need to follow-up with her primary care provider or ortho separate from current work-related injury.    At end of visit, she stated that her schedule will actually not tolerate returning Friday, will be able to return in 3 days. Follow-up with me in 3 days. Paperwork changed.    No orders of the defined types were placed in this encounter.   Patient Instructions       IF you received an x-ray today, you will receive an invoice from [facility]. Please contact [facility] at [phone] with questions or concerns regarding your invoice.   IF you received labwork today,  you will receive an invoice from [employer]. Please contact [facility] at [phone] with questions or concerns regarding your invoice.   Our billing staff will not be able to assist you with questions regarding bills from these companies.  You will be contacted with the lab results as soon as they are available. The fastest way to get your results is to activate your My Chart account. Instructions are located on the last page of this paperwork. If you have not heard from [place] regarding the results in 2 weeks, please contact this office.     On your x-rays, there did not appear to be any acute fractures, but as we discussed, sometimes there is a subtle fracture not seen on initial exam. Based on the location of your pain in the foot, should wear the walking boot, crutches if needed if pain with weightbearing, and follow-up in 6 days to recheck. You can call Next Tuesday or Wednesday to see if there are any openings in my schedule for appointments on Wednesday, but if not, follow-up with any provider next Thursday or Friday at the latest.  You did have an avulsion or slight fracture from the outside anklebone that appears to be from previous injury. Depending on your symptoms from current injury, you may need to be seen by an orthopedic surgeon. Follow-up in 5 days as planned.   In the meantime, see information below on treatment for acute injuries. Okay to use Tylenol or Advil if needed. Ice and elevate for the first few days - ice on and off for 10 minutes at a time.   Ankle Sprain An ankle sprain is an injury to the strong, fibrous tissues (ligaments) that hold the bones of your ankle joint together.  CAUSES An ankle sprain is usually caused by a fall or by twisting your ankle. Ankle sprains most commonly occur when you step on the outer edge of your foot, and your ankle turns inward. People who participate in sports are more prone to these types of injuries.  SYMPTOMS     Pain in your ankle. The pain may be present at rest or only when you are trying to stand or walk.  Swelling.  Bruising. Bruising may develop immediately or within 1 to 2 days after your injury.  Difficulty standing or walking, particularly when turning corners or changing directions. DIAGNOSIS  Your caregiver will ask you details about your injury and perform a physical exam of your ankle to determine if you have an ankle sprain. During the physical exam, your caregiver will press on and apply pressure to specific  areas of your foot and ankle. Your caregiver will try to move your ankle in certain ways. An X-ray exam may be done to be sure a bone was not broken or a ligament did not separate from one of the bones in your ankle (avulsion fracture).  TREATMENT  Certain types of braces can help stabilize your ankle. Your caregiver can make a recommendation for this. Your caregiver may recommend the use of medicine for pain. If your sprain is severe, your caregiver may refer you to a surgeon who helps to restore function to parts of your skeletal system (orthopedist) or a physical therapist. [place] ice to your injury for 1-2 days or as directed by your caregiver. Applying ice helps to reduce inflammation and pain.  Put ice in a plastic bag.  Place a towel between your skin and the bag.  Leave the ice on for 15-20 minutes at a time, every 2 hours while you are awake.  Only take over-the-counter or prescription medicines for pain, discomfort, or fever as directed by your caregiver.  Elevate your injured ankle above the level of your heart as much as possible for 2-3 days.  If your caregiver recommends crutches, use them as instructed. Gradually put weight on the affected ankle. Continue to use crutches or a cane until you can walk without feeling pain in your ankle.  If you have a plaster splint, wear the splint as directed by your caregiver. Do not rest it on anything  harder than a pillow for the first 24 hours. Do not put weight on it. Do not get it wet. You may take it off to take a shower or bath.  You may have been given an elastic bandage to wear around your ankle to provide support. If the elastic bandage is too tight (you have numbness or tingling in your foot or your foot becomes cold and blue), adjust the bandage to make it comfortable.  If you have an air splint, you may blow more air into it or let air out to make it more comfortable. You may take your splint off at night and before taking a shower or bath. Wiggle your toes in the splint several times per day to decrease swelling. SEEK MEDICAL CARE IF:   You have rapidly increasing bruising or swelling.  Your toes feel extremely cold or you lose feeling in your foot.  Your pain is not relieved with medicine. SEEK IMMEDIATE MEDICAL CARE IF:  Your toes are numb or blue.  You have severe pain that is increasing. MAKE SURE YOU:   Understand these instructions.  Will watch your condition.  Will get help right away if you are not doing well or get worse.   This information is not intended to replace advice given to you by your health care provider. Make sure you discuss any questions you have with your health care provider.   Document Released: 07/25/2005 Document Revised: 08/15/2014 Document Reviewed: 08/06/2011 Elsevier Interactive Patient Education 2016 [place] for Routine Care of Injuries Theroutine careofmanyinjuriesincludes rest, ice, compression, and elevation (RICE therapy). RICE therapy is often recommended for injuries to soft tissues, such as a muscle strain, ligament injuries, bruises, and overuse injuries. It can also be used for some bony injuries. Using RICE therapy can help to relieve pain, lessen swelling, and enable your body to heal. Rest Rest is required to allow your body to heal. This usually involves reducing your normal activities and avoiding use  of  the injured part of your body. Generally, you can return to your normal activities when you are comfortable and have been given permission by your health care provider. Ice Icing your injury helps to keep the swelling down, and it lessens pain. Do not apply ice directly to your skin.  Put ice in a plastic bag.  Place a towel between your skin and the bag.  Leave the ice on for 20 minutes, 2-3 times a day. Do this for as long as you are directed by your health care provider. Compression Compression means putting pressure on the injured area. Compression helps to keep swelling down, gives support, and helps with discomfort. Compression may be done with an elastic bandage. If an elastic bandage has been applied, follow these general tips:  Remove and reapply the bandage every 3-4 hours or as directed by your health care provider.  Make sure the bandage is not wrapped too tightly, because this can cut off circulation. If part of your body beyond the bandage becomes blue, numb, cold, swollen, or more painful, your bandage is most likely too tight. If this occurs, remove your bandage and reapply it more loosely.  See your health care provider if the bandage seems to be making your problems worse rather than better. Elevation Elevation means keeping the injured area raised. This helps to lessen swelling and decrease pain. If possible, your injured area should be elevated at or above the level of your heart or the center of your chest. [place]? You should seek medical care if:  Your pain and swelling continue.  Your symptoms are getting worse rather than improving. These symptoms may indicate that further evaluation or further X-rays are needed. Sometimes, X-rays may not show a small broken bone (fracture) until a number of days later. Make a follow-up appointment with your health care provider. WHEN SHOULD I SEEK IMMEDIATE MEDICAL CARE? You should seek immediate medical  care if:  You have sudden severe pain at or below the area of your injury.  You have redness or increased swelling around your injury.  You have tingling or numbness at or below the area of your injury that does not improve after you remove the elastic bandage.   This information is not intended to replace advice given to you by your health care provider. Make sure you discuss any questions you have with your health care provider.   Document Released: 11/06/2000 Document Revised: 04/15/2015 Document Reviewed: 07/02/2014 Elsevier Interactive Patient Education [employer].     I personally performed the services described in this documentation, which was scribed in my presence. The recorded information has been reviewed and considered, and addended by me as needed.

## 2015-10-27 ENCOUNTER — Ambulatory Visit (INDEPENDENT_AMBULATORY_CARE_PROVIDER_SITE_OTHER): Payer: Worker's Compensation | Admitting: Family Medicine

## 2015-10-27 VITALS — BP 180/100 | HR 68 | Temp 98.2°F | Resp 16 | Ht 66.75 in | Wt 170.0 lb

## 2015-10-27 DIAGNOSIS — IMO0001 Reserved for inherently not codable concepts without codable children: Secondary | ICD-10-CM

## 2015-10-27 DIAGNOSIS — R030 Elevated blood-pressure reading, without diagnosis of hypertension: Secondary | ICD-10-CM

## 2015-10-27 DIAGNOSIS — M25572 Pain in left ankle and joints of left foot: Secondary | ICD-10-CM

## 2015-10-27 DIAGNOSIS — S93602D Unspecified sprain of left foot, subsequent encounter: Secondary | ICD-10-CM

## 2015-10-27 NOTE — Patient Instructions (Addendum)
Continue the postop shoe for now, follow up with me by appointment next Wednesday. Elevate foot at the end of the night and ice only if needed. Return sooner if any worsening. At next visit, we'll likely repeat your x-ray and decide if orthopedic evaluation needed.  Your blood pressure was elevated here in the office today. Recommend you recheck this outside of the office and if remaining over 140/90, follow-up with primary care provider or other medical provider.Return to the clinic or go to the nearest emergency room if any of your symptoms worsen or new symptoms occur.

## 2015-10-27 NOTE — Progress Notes (Signed)
Subjective:  By signing my name below, I, Moises Blood, attest that this documentation has been prepared under the direction and in the presence of [name], MD. Electronically Signed: Moises Blood, [place]. 10/27/2015 , 4:45 PM .  Patient was seen in Room 4 .   Patient ID: Melinda [name], female    DOB: May 19, 1951, 65 y.o.   MRN: [id number] Chief Complaint  Patient presents with  . recheck left foot   HPI Melinda [name] is a 65 y.o. female Pt is here for workers comp follow up due to injury at work. She was seen by me initially 3 days ago for left ankle and left knee pain. At that visit, she informed that she had slipped on wet floor and fell down onto her left knee. Did note an avulsion fracture, thought due to previous injury. Date of injury was 10/23/15. There's ecchymosis at distal toes, and some apparent pain without fracture. She did not tolerate camwalker due to pressure from the strap; she was placed in post op shoes. She was given crutches if there's pain with weight bearing and work restrictions.   She states that the front of her foot is still sore. She's been applying ice to the area. She's also tried applying topical oil ([place]) from [place] on the area. She also elevates her left leg up at night. Her knee still feels sore. She's improved to about 75% now.   No Known Allergies   Prior to Admission medications   Medication Sig Start Date End Date Taking? Authorizing Provider  cholecalciferol (VITAMIN D) 1000 UNITS tablet Take 1,000 Units by mouth daily.   Yes Historical Provider, MD  Multiple Vitamin (MULTIVITAMIN) tablet Take 1 tablet by mouth daily.   Yes Historical Provider, MD  Docosahexaenoic Acid (DHA OMEGA 3 PO) Take by mouth. Reported on 10/27/2015    Historical Provider, MD  Nutritional Supplements (DETOX ENZYME FORMULA PO) Take by mouth. Reported on 10/27/2015    Historical Provider, MD    Review of Systems  Constitutional: Negative for fever, chills and  fatigue.  Gastrointestinal: Negative for nausea and vomiting.  Musculoskeletal: Positive for myalgias, arthralgias and gait problem. Negative for back pain, joint swelling, neck pain and neck stiffness.  Skin: Negative for rash and wound.      Objective:   Physical Exam  Constitutional: She is oriented to person, place, and time. She appears well-developed and well-nourished. No distress.  HENT:  Head: Normocephalic and atraumatic.  Eyes: EOM are normal. Pupils are equal, round, and reactive to light.  Neck: Neck supple.  Cardiovascular: Normal rate.   Pulmonary/Chest: Effort normal. No respiratory distress.  Musculoskeletal: Normal range of motion.  Left knee: Minimal tenderness tibia tubercle, very mild ecchymosis  Left foot: 5th metatarsal non tender, navicula non tender, minimal tenderness over dorsum of forefoot; slightly guarded with great toe flexion due to discomfort, metatarsal heads non tender, does have some flexion but slight discomfort at other toes; there is some fading ecchymosis at the medial forefoot and 1st through 3rd toes as well as plantar distal foot; 2nd toe slightly longer than great toe  Neurological: She is alert and oriented to person, place, and time.  Skin: Skin is warm and dry.  Psychiatric: She has a normal mood and affect. Her behavior is normal.  Nursing note and vitals reviewed.   Filed Vitals:   10/27/15 1526 10/27/15 1528  BP: 138/100 180/100  Pulse: 68   Temp: 98.2 F (36.8 C)   TempSrc:  Oral   Resp: 16   Height: 5' 6.75" (1.695 m)   Weight: 170 lb (77.111 kg)   SpO2: 98%       Assessment & Plan:  Melinda [name] is a 65 y.o. female Pain in joint, ankle and foot, left Foot sprain, left, subsequent encounter  - 75% improved. Still some ecchymosis at the distal foot, still some discomfort with curling her toes, and with flexion and extension of toes.  -No initial fracture, will continue postop shoe based on area of discomfort including her  great toe. Recheck in 8 days with likely repeat x-ray, repeat exam. If x-ray negative, and symptoms resolved including flexion and extension strength intact without pain, can likely advance activity. If still having discomfort that time, may consider orthopedic eval depending on x-ray reading. Keep postop shoe and restrictions for now.  Elevated blood pressure  - Reportedly whitecoat hypertension. Denies any chest pain, weakness, headache, dizziness. Asymptomatic. Check blood pressures outside of office, and if remaining elevated, follow-up with primary care provider or other medical provider.  No orders of the defined types were placed in this encounter.   Patient Instructions  Continue the postop shoe for now, follow up with me by appointment next Wednesday. Elevate foot at the end of the night and ice only if needed. Return sooner if any worsening. At next visit, we'll likely repeat your x-ray and decide if orthopedic evaluation needed.  Your blood pressure was elevated here in the office today. Recommend you recheck this outside of the office and if remaining over 140/90, follow-up with primary care provider or other medical provider.Return to the clinic or go to the nearest emergency room if any of your symptoms worsen or new symptoms occur.      I personally performed the services described in this documentation, which was scribed in my presence. The recorded information has been reviewed and considered, and addended by me as needed.

## 2015-11-04 ENCOUNTER — Ambulatory Visit: Payer: Worker's Compensation

## 2015-11-04 ENCOUNTER — Ambulatory Visit (INDEPENDENT_AMBULATORY_CARE_PROVIDER_SITE_OTHER): Payer: Worker's Compensation | Admitting: Family Medicine

## 2015-11-04 ENCOUNTER — Encounter: Payer: Self-pay | Admitting: Family Medicine

## 2015-11-04 VITALS — BP 183/99 | HR 78 | Temp 98.1°F | Resp 16 | Ht 66.75 in | Wt 170.0 lb

## 2015-11-04 DIAGNOSIS — M79675 Pain in left toe(s): Secondary | ICD-10-CM | POA: Diagnosis not present

## 2015-11-04 DIAGNOSIS — M79672 Pain in left foot: Secondary | ICD-10-CM | POA: Diagnosis not present

## 2015-11-04 NOTE — Progress Notes (Addendum)
Subjective:    Patient ID: Melinda [name], female    DOB: January 14, 1951, 65 y.o.   MRN: [id number] By signing my name below, I, Zola Button, attest that this documentation has been prepared under the direction and in the presence of [name], MD.  Electronically Signed: Zola Button, Medical Scribe. 11/04/2015. 10:39 AM.  HPI HPI Comments: Melinda [name] is a 65 y.o. female who presents to the [facility] for a worker's comp follow-up for an injury that occurred at work. DOI March 17th. Left knee contusion, left foot injury with a probable remote avulsion of the distal fibula. Initially placed in post-op shoe. Re-check on March 21st was 75% improved. Plan for repeat exam and possible repeat XR. Initial XR negative for acute fracture in the foot. She was continued in the post-op shoe. She still has some swelling in her foot above the 2nd and 3rd toes of the left foot, but this has improved.   No Known Allergies Prior to Admission medications   Medication Sig Start Date End Date Taking? Authorizing Provider  cholecalciferol (VITAMIN D) 1000 UNITS tablet Take 1,000 Units by mouth daily.   Yes Historical Provider, MD  Docosahexaenoic Acid (DHA OMEGA 3 PO) Take by mouth. Reported on 10/27/2015   Yes Historical Provider, MD  Multiple Vitamin (MULTIVITAMIN) tablet Take 1 tablet by mouth daily.   Yes Historical Provider, MD  Nutritional Supplements (DETOX ENZYME FORMULA PO) Take by mouth. Reported on 10/27/2015   Yes Historical Provider, MD    Review of Systems  Musculoskeletal: Positive for joint swelling.       Objective:   Physical Exam  Constitutional: She is oriented to person, place, and time. She appears well-developed and well-nourished. No distress.  HENT:  Head: Normocephalic and atraumatic.  Mouth/Throat: Oropharynx is clear and moist. No oropharyngeal exudate.  Eyes: Pupils are equal, round, and reactive to light.  Neck: Neck supple.  Cardiovascular: Normal  rate.   Pulmonary/Chest: Effort normal.  Musculoskeletal: She exhibits tenderness.  Left foot: Decreased flexion of 2nd toe. Ankle non-tender. 5th metatarsal non-tender. Navicula non-tender. Possible faint soft tissue swelling at dorsum of foot. No mid-foot tenderness. Tenderness at base of 2nd toe, otherwise non-tender. Able to resist extension at the 2nd toe. Able to flex, but decreased total ROM of flexion at 2nd toe.  Neurological: She is alert and oriented to person, place, and time. No cranial nerve deficit.  NVI distally to toes.  Skin: Skin is warm and dry. No rash noted.  Bruising has resolved.  Psychiatric: She has a normal mood and affect. Her behavior is normal.  Nursing note and vitals reviewed.   Filed Vitals:   11/04/15 1012  BP: 183/99  Pulse: 78  Temp: 98.1 F (36.7 C)  Resp: 16  Height: 5' 6.75" (1.695 m)  Weight: 170 lb (77.111 kg)   Dg Foot Complete Left  11/04/2015  CLINICAL DATA:  Swelling.  Prior injury. EXAM: LEFT FOOT - COMPLETE 3+ VIEW COMPARISON:  10/24/2015. FINDINGS: Fractures along the plantar aspect of the distal phalanx of the left great toe cannot be excluded. Fracture at the base of the plantar aspect of the middle phalanx of the left second toe cannot be excluded. No other focal abnormality identified . IMPRESSION: 1. Fracture along the plantar aspect of the distal phalanx of the left great toe cannot be excluded. 2. Fracture along the plantar aspect of the base of the middle phalanx of the left second toe appears to  be present. Electronically Signed   By: Marcello Moores  Register   On: 11/04/2015 11:37       Assessment & Plan:  Melinda [name] is a 65 y.o. female Left foot pain - Plan: DG Foot Complete Left, CANCELED: DG Toe 2nd Left  Toe pain, left - Plan: DG Foot Complete Left, CANCELED: DG Toe 2nd Left Foot pain d/t injury at work.   Improved, but some TTP over 2nd greater than great toe on left foot, possible FX on XR (suspected fx with initial  echymosis).    -buddy tape 1st to 2nd toes, continue post op shoe 2 weeks, then recheck. Work Quar[employer] manager provided. rtc sooner if worse.   No orders of the defined types were placed in this encounter.   Patient Instructions       IF you received an x-ray today, you will receive an invoice from [facility]. Please contact [facility] at [phone] with questions or concerns regarding your invoice.   IF you received labwork today, you will receive an invoice from [employer]. Please contact [facility] at [phone] with questions or concerns regarding your invoice.   Our billing staff will not be able to assist you with questions regarding bills from these companies.  You will be contacted with the lab results as soon as they are available. The fastest way to get your results is to activate your My Chart account. Instructions are located on the last page of this paperwork. If you have not heard from [place] regarding the results in 2 weeks, please contact this office.    Possible fractures noted at great and 2nd toes.  Buddy tape, continue post op shoe and recheck in 2 weeks.     I personally performed the services described in this documentation, which was scribed in my presence. The recorded information has been reviewed and considered, and addended by me as needed.

## 2015-11-04 NOTE — Patient Instructions (Addendum)
     IF you received an x-ray today, you will receive an invoice from [facility]. Please contact [facility] at [phone] with questions or concerns regarding your invoice.   IF you received labwork today, you will receive an invoice from [employer]. Please contact [facility] at [phone] with questions or concerns regarding your invoice.   Our billing staff will not be able to assist you with questions regarding bills from these companies.  You will be contacted with the lab results as soon as they are available. The fastest way to get your results is to activate your My Chart account. Instructions are located on the last page of this paperwork. If you have not heard from [place] regarding the results in 2 weeks, please contact this office.    Possible fractures noted at great and 2nd toes.  Buddy tape, continue post op shoe and recheck in 2 weeks.

## 2015-11-18 ENCOUNTER — Encounter: Payer: Self-pay | Admitting: Family Medicine

## 2015-11-18 ENCOUNTER — Ambulatory Visit (INDEPENDENT_AMBULATORY_CARE_PROVIDER_SITE_OTHER): Payer: Worker's Compensation | Admitting: Family Medicine

## 2015-11-18 VITALS — BP 178/90 | HR 83 | Temp 98.5°F | Resp 16 | Ht 68.0 in | Wt 170.6 lb

## 2015-11-18 DIAGNOSIS — S92402D Displaced unspecified fracture of left great toe, subsequent encounter for fracture with routine healing: Secondary | ICD-10-CM | POA: Diagnosis not present

## 2015-11-18 DIAGNOSIS — M25472 Effusion, left ankle: Secondary | ICD-10-CM

## 2015-11-18 DIAGNOSIS — S92912D Unspecified fracture of left toe(s), subsequent encounter for fracture with routine healing: Secondary | ICD-10-CM | POA: Diagnosis not present

## 2015-11-18 NOTE — Progress Notes (Addendum)
Subjective:  By signing my name below, I, Moises Blood, attest that this documentation has been prepared under the direction and in the presence of [name], MD. Electronically Signed: Moises Blood, [place]. 11/18/2015 , 12:58 PM .  Patient was seen in Room 28 .   Patient ID: Melinda [name], female    DOB: Nov 20, 1950, 65 y.o.   MRN: [id number] Chief Complaint  Patient presents with  . WORKER'S COMP    FOLLOW UP INJURY - left foot   HPI Melinda [name] is a 65 y.o. female Here for follow up of left foot injury caused by an injury while at work.  Initial visit on 3/18. Initial date of injury was 3/17. See prior visits for treatment of left knee contusion and noted probable fracture of the middle phalanx of left 2nd toe, possible fracture of left great toe. She was instructed to use buddy tape and post op shoes for 2 weeks. Here for recheck.   She's been buddy taping her toes everyday and takes them off only when she goes to sleep. She also notes having some swelling at the base of her leg.   No Known Allergies Prior to Admission medications   Medication Sig Start Date End Date Taking? Authorizing Provider  cholecalciferol (VITAMIN D) 1000 UNITS tablet Take 1,000 Units by mouth daily.    Historical Provider, MD  Docosahexaenoic Acid (DHA OMEGA 3 PO) Take by mouth. Reported on 10/27/2015    Historical Provider, MD  Multiple Vitamin (MULTIVITAMIN) tablet Take 1 tablet by mouth daily.    Historical Provider, MD  Nutritional Supplements (DETOX ENZYME FORMULA PO) Take by mouth. Reported on 10/27/2015    Historical Provider, MD    Review of Systems  Constitutional: Negative for fever, chills and fatigue.  Musculoskeletal: Positive for myalgias, joint swelling and arthralgias. Negative for gait problem.  Skin: Negative for rash and wound.  Neurological: Negative for dizziness, weakness, numbness and headaches.      Objective:   Physical Exam  Constitutional: She is oriented to  person, place, and time. She appears well-developed and well-nourished. No distress.  HENT:  Head: Normocephalic and atraumatic.  Eyes: EOM are normal. Pupils are equal, round, and reactive to light.  Neck: Neck supple.  Cardiovascular: Normal rate.   Pulmonary/Chest: Effort normal. No respiratory distress.  Musculoskeletal: Normal range of motion.  Left foot: Very faint area of ecchymosis at the distal great toe; flexion and extension of left great and 2nd toe, some plantar discomfort in the phalanx of great toe, minimal tenderness at the middle phalanx of great toe Left ankle: malleoli non tender, achilles non tender, navicular non tender, slight prominence, pain free dorsi flexion, slight tender tib anterior with possible soft tissue swelling of left ankle  Neurological: She is alert and oriented to person, place, and time.  Skin: Skin is warm and dry.  Psychiatric: She has a normal mood and affect. Her behavior is normal.  Nursing note and vitals reviewed.   Filed Vitals:   11/18/15 1146  BP: 178/90  Pulse: 83  Temp: 98.5 F (36.9 C)  TempSrc: Oral  Resp: 16  Height: 5\' 8"  (1.727 m)  Weight: 170 lb 9.6 oz (77.384 kg)  SpO2: 98%      Assessment & Plan:   Melinda [name] is a 65 y.o. female Left ankle swelling  Fractured great toe, left, with routine healing, subsequent encounter  Toe fracture, left, with routine healing, subsequent encounter Above injuries due to injury at  work. Date of injury 10/23/2015.  Persistent pain over first and second toes, suspected fracture is seen last visit. Will continue buddy taping, postop shoe, and recheck in 2 weeks for possible repeat x-ray. Slight anterior ankle swelling, possible initial sprain, but no focal bony tenderness. Discussed swelling may be last symptom to resolve. Function intact. Recheck in 2 weeks, sooner if worse.  Meds ordered this encounter  Medications  . Ascorbic Acid (VITAMIN C) 100 MG tablet    Sig: Take 2,000 mg  by mouth daily.  . Ergocalciferol (VITAMIN D2 PO)    Sig: Take 1,000 Units by mouth.  . Cyanocobalamin (VITAMIN B-12 PO)    Sig: Take by mouth.   Patient Instructions       IF you received an x-ray today, you will receive an invoice from [facility]. Please contact [facility] at [phone] with questions or concerns regarding your invoice.   IF you received labwork today, you will receive an invoice from [employer]. Please contact [facility] at [phone] with questions or concerns regarding your invoice.   Our billing staff will not be able to assist you with questions regarding bills from these companies.  You will be contacted with the lab results as soon as they are available. The fastest way to get your results is to activate your My Chart account. Instructions are located on the last page of this paperwork. If you have not heard from [place] regarding the results in 2 weeks, please contact this office.     Continue post op shoe and buddy tape to great toe and 2nd toe, recheck in 2 weeks for xray. Sooner if worse.     I personally performed the services described in this documentation, which was scribed in my presence. The recorded information has been reviewed and considered, and addended by me as needed.

## 2015-11-18 NOTE — Patient Instructions (Signed)
     IF you received an x-ray today, you will receive an invoice from [facility]. Please contact [facility] at [phone] with questions or concerns regarding your invoice.   IF you received labwork today, you will receive an invoice from [employer]. Please contact [facility] at [phone] with questions or concerns regarding your invoice.   Our billing staff will not be able to assist you with questions regarding bills from these companies.  You will be contacted with the lab results as soon as they are available. The fastest way to get your results is to activate your My Chart account. Instructions are located on the last page of this paperwork. If you have not heard from [place] regarding the results in 2 weeks, please contact this office.     Continue post op shoe and buddy tape to great toe and 2nd toe, recheck in 2 weeks for xray. Sooner if worse.

## 2015-12-02 ENCOUNTER — Ambulatory Visit (INDEPENDENT_AMBULATORY_CARE_PROVIDER_SITE_OTHER): Payer: Worker's Compensation | Admitting: Family Medicine

## 2015-12-02 ENCOUNTER — Ambulatory Visit: Payer: Worker's Compensation

## 2015-12-02 ENCOUNTER — Encounter: Payer: Self-pay | Admitting: Family Medicine

## 2015-12-02 VITALS — BP 132/96 | HR 67 | Temp 97.7°F | Resp 16 | Ht 67.0 in | Wt 172.6 lb

## 2015-12-02 DIAGNOSIS — S92402D Displaced unspecified fracture of left great toe, subsequent encounter for fracture with routine healing: Secondary | ICD-10-CM

## 2015-12-02 DIAGNOSIS — M25572 Pain in left ankle and joints of left foot: Secondary | ICD-10-CM

## 2015-12-02 DIAGNOSIS — S92912D Unspecified fracture of left toe(s), subsequent encounter for fracture with routine healing: Secondary | ICD-10-CM

## 2015-12-02 NOTE — Progress Notes (Signed)
Subjective:  By signing my name below, I, Melinda [name], attest that this documentation has been prepared under the direction and in the presence of Melinda Ray, MD. Electronically Signed: [name], [place]. 12/02/2015 , 11:57 AM .  Patient was seen in Room 21 .   Patient ID: Melinda [name], female    DOB: [birth year], 65 y.o.   MRN: [id number] Chief Complaint  Patient presents with  . Follow-up  . left foot    toes more pain in them in the last 2 wks, per pt   HPI Melinda [name] is a 65 y.o. female Here for follow up from injury at work. Initial visit was 10/24/15 for left ankle pain, left foot pain, left knee pain. Date of injury 10/23/15. Occult toe fractures were noted at 3/29 visit, distal phalanx of the great toe and base of the middle phalanx of the left second toe. Instructed to buddy tape with post op shoes. Recheck 2 weeks ago still with persistent pain over the toes. Continue buddy taping and post op shoes. She was still having some anterior ankle swelling at that visit but no focal bony tenderness. Continued in post op shoes. Here for recheck.   Patient states there's more pain issues over the past week because she believes the coban rolled her 2nd left toe. She's tried other tape and loosened the bind. She denies any new bruising.   No Known Allergies Prior to Admission medications   Medication Sig Start Date End Date Taking? Authorizing Provider  Ascorbic Acid (VITAMIN C) 100 MG tablet Take 2,000 mg by mouth daily.    Historical Provider, MD  cholecalciferol (VITAMIN D) 1000 UNITS tablet Take 1,000 Units by mouth daily.    Historical Provider, MD  Cyanocobalamin (VITAMIN B-12 PO) Take by mouth.    Historical Provider, MD  Docosahexaenoic Acid (DHA OMEGA 3 PO) Take by mouth. Reported on 11/18/2015    Historical Provider, MD  Ergocalciferol (VITAMIN D2 PO) Take 1,000 Units by mouth.    Historical Provider, MD  Multiple Vitamin (MULTIVITAMIN) tablet Take 1 tablet by mouth  daily.    Historical Provider, MD  Nutritional Supplements (DETOX ENZYME FORMULA PO) Take by mouth. Reported on 11/18/2015    Historical Provider, MD    Review of Systems  Constitutional: Negative for fever, chills and fatigue.  Musculoskeletal: Positive for myalgias, joint swelling and arthralgias. Negative for gait problem.  Skin: Negative for rash and wound.  Neurological: Negative for dizziness, weakness, numbness and headaches.       Objective:   Physical Exam  Constitutional: She is oriented to person, place, and time. She appears well-developed and well-nourished. No distress.  HENT:  Head: Normocephalic and atraumatic.  Eyes: EOM are normal. Pupils are equal, round, and reactive to light.  Neck: Neck supple.  Cardiovascular: Normal rate.   Pulmonary/Chest: Effort normal. No respiratory distress.  Musculoskeletal: Normal range of motion.  Left foot: Slight tenderness of great toe, dorsal aspect distal phalanx Bony prominence of second toe at the base, discomfort with flexion of the PIP Minimal soft tissue swelling in anterior ankle, no focal tenderness  Neurological: She is alert and oriented to person, place, and time.  Skin: Skin is warm and dry.  Psychiatric: She has a normal mood and affect. Her behavior is normal.  Nursing note and vitals reviewed.   Filed Vitals:   12/02/15 1120 12/02/15 1126  BP: 140/100 132/96  Pulse: 67   Temp: 97.7 F (36.5 C)   TempSrc:  Oral   Resp: 16   Height: 5\' 7"  (1.702 m)   Weight: 172 lb 9.6 oz (78.291 kg)   SpO2: 99%    Dg Foot Complete Left  12/02/2015  CLINICAL DATA:  65 year old female with left great toe distal phalanx and second toe middle phalanx fractures suspected in March. Subsequent encounter. EXAM: LEFT FOOT - COMPLETE 3+ VIEW COMPARISON:  11/04/2015 and earlier. FINDINGS: Stable, preserved alignment of the first and second phalanges. On today lateral view, the minimally displaced volar aspect fractures at the base of  the first distal and second middle phalanges are re- demonstrated (arrows). Fragment alignment appears stable. No solid bridging bone suspected. No new osseous abnormality in the left foot. IMPRESSION: 1. No definite healing of the minimally displaced volar surface fractures at the base of the first distal and second middle phalanges. These are best demonstrated by fanning the toes apart on the lateral view. 2. No new  osseous abnormality identified. Electronically Signed   By: Genevie Ann M.D.   On: 12/02/2015 12:29      Assessment & Plan:   Melinda [name] is a 65 y.o. female Fracture of great toe, left, closed, with routine healing, subsequent encounter - Plan: DG Foot Complete Left  Fracture of left toe, with routine healing, subsequent encounter - Plan: DG Foot Complete Left  Pain in joint, ankle and foot, left - Plan: DG Foot Complete Left  Great toe and second toe fractures. Now 5 weeks post injury, but not definitively seen on initial x-rays. Persistent, with similar discomfort on palpation vs. previous exam. Second toe more uncomfortable than great toe. No known reinjury, x-rays stable as above.  - Continue postop shoe and splinting. Recheck in 2 weeks. If some bony healing not apparent in the next 2-4 weeks with continue splinting, consider orthopedic evaluation.  - Letter given for her employer regarding restrictions.  Meds ordered this encounter  Medications  . Multiple Vitamins-Minerals (ZINC PO)    Sig: Take by mouth.   Patient Instructions       IF you received an x-[name] today, you will receive an invoice from [facility]. Please contact [facility] at [phone] with questions or concerns regarding your invoice.   IF you received labwork today, you will receive an invoice from [employer]. Please contact [facility] at [phone] with questions or concerns regarding your invoice.   Our billing staff will not be able to assist you  with questions regarding bills from these companies.  You will be contacted with the lab results as soon as they are available. The fastest way to get your results is to activate your My Chart account. Instructions are located on the last page of this paperwork. If you have not heard from [place] regarding the results in 2 weeks, please contact this office.    For now continue the post op shoe and buddy taping.  Once I see your xray results, can discuss if any new changes to the plan.   Return to the clinic or go to the nearest emergency room if any of your symptoms worsen or new symptoms occur.     I personally performed the services described in this documentation, which was scribed in my presence. The recorded information has been reviewed and considered, and addended by me as needed.

## 2015-12-02 NOTE — Patient Instructions (Addendum)
     IF you received an x-ray today, you will receive an invoice from [facility]. Please contact [facility] at [phone] with questions or concerns regarding your invoice.   IF you received labwork today, you will receive an invoice from [employer]. Please contact [facility] at [phone] with questions or concerns regarding your invoice.   Our billing staff will not be able to assist you with questions regarding bills from these companies.  You will be contacted with the lab results as soon as they are available. The fastest way to get your results is to activate your My Chart account. Instructions are located on the last page of this paperwork. If you have not heard from [place] regarding the results in 2 weeks, please contact this office.    For now continue the post op shoe and buddy taping.  Once I see your xray results, can discuss if any new changes to the plan.   Return to the clinic or go to the nearest emergency room if any of your symptoms worsen or new symptoms occur.

## 2015-12-30 ENCOUNTER — Encounter: Payer: Self-pay | Admitting: Family Medicine

## 2015-12-30 ENCOUNTER — Ambulatory Visit (INDEPENDENT_AMBULATORY_CARE_PROVIDER_SITE_OTHER): Payer: Worker's Compensation

## 2015-12-30 ENCOUNTER — Ambulatory Visit (INDEPENDENT_AMBULATORY_CARE_PROVIDER_SITE_OTHER): Payer: Worker's Compensation | Admitting: Family Medicine

## 2015-12-30 VITALS — BP 120/82 | HR 76 | Temp 98.0°F | Resp 16 | Ht 67.0 in | Wt 172.6 lb

## 2015-12-30 DIAGNOSIS — S92912D Unspecified fracture of left toe(s), subsequent encounter for fracture with routine healing: Secondary | ICD-10-CM

## 2015-12-30 DIAGNOSIS — S92402D Displaced unspecified fracture of left great toe, subsequent encounter for fracture with routine healing: Secondary | ICD-10-CM | POA: Diagnosis not present

## 2015-12-30 DIAGNOSIS — S92402G Displaced unspecified fracture of left great toe, subsequent encounter for fracture with delayed healing: Secondary | ICD-10-CM | POA: Diagnosis not present

## 2015-12-30 NOTE — Progress Notes (Addendum)
By signing my name below, I, [name], attest that this documentation has been prepared under the direction and in the presence of [name], MD.  Electronically Signed: Verlee Monte, Medical Scribe. 12/30/2015. 11:52 AM. Subjective:    Patient ID: Melinda [name], female    DOB: 1951-05-20, 65 y.o.   MRN: [id number]  HPI Chief Complaint  Patient presents with  . Follow-up  . Left foot    Great toe and 2nd toe, pt states she has had 3 acupunctures, "toe feels better"    HPI Comments: Melinda [name] is a 65 y.o. female who presents to the [facility] for a follow-up for workers comp injury to her left foot. Her date of injury is 10/23/15 to her left foot, great toe and 2nd toe fractures were noted and X-Rays March 29th. Treated with buddy taping and post-op shoe. Last visit April 26th, still some discomfort at 5 weeks post injury. X-ray: stable fractures without definite healing - continued in buddy taping. She's continued with same restrictions last visit. Pt reports her foot is doing really good. Pt reports the buddy tape caused her discomfort bothering her; pt suspects this has caused a neuroma to form on her second toe. Pain was relieved with 3 acupunctures with a laser about 3 weeks ago to her great toe and 2nd toe.She did note that during acupuncture, that the toe was pulled on. Pt wore her post-op shoe for the most part but began wearing other shoes at times. . Pt experienced some discomfort in the front of her foot while wearing other shoes once. Pt has been able to ambulate on both feet. No recent pain in toes. Wearing post op shoe today.   No Known Allergies Prior to Admission medications   Medication Sig Start Date End Date Taking? Authorizing Provider  Ascorbic Acid (VITAMIN C) 100 MG tablet Take 2,000 mg by mouth daily.   Yes Historical Provider, MD  cholecalciferol (VITAMIN D) 1000 UNITS tablet Take 1,000 Units by mouth daily.   Yes Historical  Provider, MD  Cyanocobalamin (VITAMIN B-12 PO) Take by mouth.   Yes Historical Provider, MD  Multiple Vitamin (MULTIVITAMIN) tablet Take 1 tablet by mouth daily.   Yes Historical Provider, MD  Multiple Vitamins-Minerals (ZINC PO) Take by mouth.   Yes Historical Provider, MD  Docosahexaenoic Acid (DHA OMEGA 3 PO) Take by mouth. Reported on 12/30/2015    Historical Provider, MD  Ergocalciferol (VITAMIN D2 PO) Take 1,000 Units by mouth. Reported on 12/30/2015    Historical Provider, MD  Nutritional Supplements (DETOX ENZYME FORMULA PO) Take by mouth. Reported on 12/30/2015    Historical Provider, MD   Review of Systems  Musculoskeletal: Negative for gait problem.   Objective:  BP 120/82 mmHg  Pulse 76  Temp(Src) 98 F (36.7 C) (Oral)  Resp 16  Ht 5\' 7"  (1.702 m)  Wt 172 lb 9.6 oz (78.291 kg)  BMI 27.03 kg/m2  SpO2 98%  LMP 08/04/2009  Physical Exam  Constitutional: She is oriented to person, place, and time. She appears well-developed and well-nourished. No distress.  Pulmonary/Chest: Effort normal.  Musculoskeletal:  Toes: Slightly decreased flexion of the left great toe compared to the right in her toes Extension intact Minimally decreased flexion in second toes No palpable tenderness Skin is intact  Neurological: She is alert and oriented to person, place, and time.  Skin: Skin is warm and dry. No rash noted.  nvi distally  Psychiatric: She has a normal  mood and affect. Her behavior is normal.   Dg Toe 2nd Left  12/30/2015  CLINICAL DATA:  Fracture follow-up. EXAM: LEFT SECOND TOE COMPARISON:  12/30/2015. FINDINGS: Callus formation is noted about the middle phalanx of the left second digit consistent with fracture healing. Again noted is a displaced fracture fragment at the base of the distal phalanx of the left great toe. IMPRESSION: Callus formation noted about the middle phalanx of the left second digit consistent with healing fracture. Displaced fracture fragment again noted  the base of the distal phalanx of the left great toe. Electronically Signed   By: Marcello Moores  Register   On: 12/30/2015 12:59   Dg Toe Great Left  12/30/2015  CLINICAL DATA:  Great toe fracture follow-up. EXAM: LEFT GREAT TOE COMPARISON:  12/02/2015. FINDINGS: Displaced fracture fragment noted the base of the distal phalanx of the left great toe. Fracture appears to be more displaced than on prior study. No other focal abnormality identified. IMPRESSION: Displaced fracture fragment at the base of the distal phalanx of the left great toe. Fracture fragment displacement appears to have increased from prior study. Electronically Signed   By: Marcello Moores  Register   On: 12/30/2015 12:57    Assessment & Plan:   Melinda [name] is a 65 y.o. female Fractured great toe, left, with delayed healing, subsequent encounter - Plan: DG Toe Great Left, Ambulatory referral to Orthopedic Surgery  Toe fracture, left, with routine healing, subsequent encounter - Plan: DG Toe 2nd Left, Ambulatory referral to Orthopedic Surgery  Great and 2nd left toe fracture, now over 9 weeks since injury. Injury occurred at work.  Clinically improved, but radiographically with widening/further displacement of great toe fracture.  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 Also has not been able to tolerate buddy taping. Healing 2nd toe fracture.  - resume postop shoe all the time for now. Avoid other shoes and any movement/traction of toe.   -refer to orthopaedics.   -note/letter for work provided.    No orders of the defined types were placed in this encounter.   Patient Instructions       IF you received an x-ray today, you will receive an invoice from [facility]. Please contact [facility] at [phone] with questions or concerns regarding your invoice.   IF you  received labwork today, you will receive an invoice from [employer]. Please contact [facility] at [phone] with questions or concerns regarding your invoice.   Our billing staff will not be able to assist you with questions regarding bills from these companies.  You will be contacted with the lab results as soon as they are available. The fastest way to get your results is to activate your My Chart account. Instructions are located on the last page of this paperwork. If you have not heard from [place] regarding the results in 2 weeks, please contact this office.    Although clinically your pain has improved, I still see the fracture of your toes, and specifically the great toe appears to be further apart.  I would recommend evaluation with orthopaedics to see if other treatment needed and remain in postop shoe for now.  Same restrictions for now at work.      I personally performed the services described in this documentation, which was scribed in my presence. The recorded information has been reviewed  and considered, and addended by me as needed.

## 2015-12-30 NOTE — Patient Instructions (Addendum)
     IF you received an x-ray today, you will receive an invoice from [facility]. Please contact [facility] at [phone] with questions or concerns regarding your invoice.   IF you received labwork today, you will receive an invoice from [employer]. Please contact [facility] at [phone] with questions or concerns regarding your invoice.   Our billing staff will not be able to assist you with questions regarding bills from these companies.  You will be contacted with the lab results as soon as they are available. The fastest way to get your results is to activate your My Chart account. Instructions are located on the last page of this paperwork. If you have not heard from [place] regarding the results in 2 weeks, please contact this office.    Although clinically your pain has improved, I still see the fracture of your toes, and specifically the great toe appears to be further apart.  I would recommend evaluation with orthopaedics to see if other treatment needed and remain in postop shoe for now.  Same restrictions for now at work.

## 2016-01-21 ENCOUNTER — Encounter: Payer: Self-pay | Admitting: Family Medicine

## 2016-01-21 ENCOUNTER — Ambulatory Visit (INDEPENDENT_AMBULATORY_CARE_PROVIDER_SITE_OTHER): Payer: BLUE CROSS/BLUE SHIELD | Admitting: Family Medicine

## 2016-01-21 VITALS — BP 177/90 | HR 70 | Temp 98.1°F | Resp 16 | Ht 67.0 in | Wt 171.2 lb

## 2016-01-21 DIAGNOSIS — Z130 Encounter for screening for diseases of the blood and blood-forming organs and certain disorders involving the immune mechanism: Secondary | ICD-10-CM

## 2016-01-21 DIAGNOSIS — R928 Other abnormal and inconclusive findings on diagnostic imaging of breast: Secondary | ICD-10-CM | POA: Diagnosis not present

## 2016-01-21 DIAGNOSIS — Z131 Encounter for screening for diabetes mellitus: Secondary | ICD-10-CM

## 2016-01-21 DIAGNOSIS — R0789 Other chest pain: Secondary | ICD-10-CM | POA: Diagnosis not present

## 2016-01-21 DIAGNOSIS — IMO0001 Reserved for inherently not codable concepts without codable children: Secondary | ICD-10-CM

## 2016-01-21 DIAGNOSIS — Z1329 Encounter for screening for other suspected endocrine disorder: Secondary | ICD-10-CM | POA: Diagnosis not present

## 2016-01-21 DIAGNOSIS — Z13220 Encounter for screening for lipoid disorders: Secondary | ICD-10-CM | POA: Diagnosis not present

## 2016-01-21 DIAGNOSIS — R030 Elevated blood-pressure reading, without diagnosis of hypertension: Secondary | ICD-10-CM

## 2016-01-21 DIAGNOSIS — Z0000 Encounter for general adult medical examination without abnormal findings: Secondary | ICD-10-CM | POA: Diagnosis not present

## 2016-01-21 LAB — CBC
HCT: 40.9 % (ref 35.0–45.0)
Hemoglobin: 13.4 g/dL (ref 11.7–15.5)
MCH: 28 pg (ref 27.0–33.0)
MCHC: 32.8 g/dL (ref 32.0–36.0)
MCV: 85.4 fL (ref 80.0–100.0)
MPV: 12.8 fL — AB (ref 7.5–12.5)
PLATELETS: 173 10*3/uL (ref 140–400)
RBC: 4.79 MIL/uL (ref 3.80–5.10)
RDW: 13.3 % (ref 11.0–15.0)
WBC: 4.3 10*3/uL (ref 3.8–10.8)

## 2016-01-21 NOTE — Progress Notes (Signed)
By signing my name below, I, [name], attest that this documentation has been prepared under the direction and in the presence of [name], MD.  Electronically Signed: Verlee Monte, Medical Scribe. 01/21/2016. 2:06 PM.  Subjective:    Patient ID: Melinda [name], female    DOB: [birth year], 65 y.o.   MRN: [id number]  HPI  Chief Complaint  Patient presents with  . Annual Exam    HPI Comments: Melinda [name] is a 65 y.o. female who presents to the [facility] for an annual physical exam. Pt took the herbal supplement garcina cambogia  to help her metabolism. Pt was fine for a longtime, but noticed her heart felt "lumpy" that felt like chest tightness. It stopped after 4 weeks of stop taking it and her symptoms have subsided since. Pt denies diaphoresis, nausea, and SOB while experiencing her symptoms. Pt check bp at home and it runs around 130/80 at home- doesn't think it's sustained. Pt states she's anemic, but gets iron through the supplements she takes.   CA Screening: Colon CA: She has not had it done. Pt sees Dr. Deirdre Pippins for cologuard colon CA testing. Pt denies FHx of colon CA.  Breast CA: Mammogram Aug 2013 intraductal mass at 40 o' clock of the left breast. Recommended biopsy, ultra sound was preformed at the same time. No further follow-up from that. Pt has a consult with Dr. Deirdre Pippins in a couple of weeks, would like to discuss ultra sound at that time. Cervical CA: Last pap smear was nl. Seen by Dr. Phineas Real  Depression: Depression screen [facility] 2/9 01/21/2016 12/30/2015 12/02/2015 11/18/2015 11/04/2015  Decreased Interest 0 0 0 0 0  Down, Depressed, Hopeless 0 0 0 0 0  PHQ - 2 Score 0 0 0 0 0   Immunizations:  There is no immunization history on file for this patient.  Tetanus Pt reports it was done 3-4 years ago Pt deferred shingles vaccine.  Hep C/HIV Testing: Pt had negative Hep C antibody Aug 2013. Pt deferred the HIV testing.  Fall Screening: 1  fall with workers comp injury in past year.  Vision:   Visual Acuity Screening   Right eye Left eye Both eyes  Without correction:     With correction: 20/25 20/40 20/20    Pt wears contacts. One side is for reading music, and the other side is for playing music.  Dental: Saw dentist this morning. Has an appt once a year.  Exercise: Pt doesn't exercise much since she sprained her ankle, and fractured her toe.   SHx: Pt is a pescatarian, and eats chicken if it's organic.  FHx: Maternal Grandpa died of CVA at 3 y/o.  There are no active problems to display for this patient.  No past medical history on file. No past surgical history on file. No Known Allergies Prior to Admission medications   Medication Sig Start Date End Date Taking? Authorizing Provider  Ascorbic Acid (VITAMIN C) 100 MG tablet Take 2,000 mg by mouth daily.    Historical Provider, MD  cholecalciferol (VITAMIN D) 1000 UNITS tablet Take 1,000 Units by mouth daily.    Historical Provider, MD  Cyanocobalamin (VITAMIN B-12 PO) Take by mouth.    Historical Provider, MD  Docosahexaenoic Acid (DHA OMEGA 3 PO) Take by mouth. Reported on 12/30/2015    Historical Provider, MD  Ergocalciferol (VITAMIN D2 PO) Take 1,000 Units by mouth. Reported on 12/30/2015    Historical Provider, MD  Multiple Vitamin (MULTIVITAMIN) tablet  Take 1 tablet by mouth daily.    Historical Provider, MD  Multiple Vitamins-Minerals (ZINC PO) Take by mouth.    Historical Provider, MD  Nutritional Supplements (DETOX ENZYME FORMULA PO) Take by mouth. Reported on 12/30/2015    Historical Provider, MD   Social History   Social History  . Marital Status: Married    Spouse Name: N/A  . Number of Children: N/A  . Years of Education: N/A   Occupational History  . FREELANCE VIOLINIST   . TEACHER    Social History Main Topics  . Smoking status: Never Smoker   . Smokeless tobacco: Never Used  . Alcohol Use: 0.0 oz/week    0 Standard drinks or equivalent  per week     Comment: rare - wine  . Drug Use: No  . Sexual Activity: No   Other Topics Concern  . Not on file   Social History Narrative   Review of Systems 13 point ROS was negative Objective:  BP 177/90 mmHg  Pulse 70  Temp(Src) 98.1 F (36.7 C) (Oral)  Resp 16  Ht 5\' 7"  (1.702 m)  Wt 171 lb 3.2 oz (77.656 kg)  BMI 26.81 kg/m2  SpO2 98%  LMP 08/04/2009  Physical Exam  Constitutional: She is oriented to person, place, and time. She appears well-developed and well-nourished.  HENT:  Head: Normocephalic and atraumatic.  Right Ear: External ear normal.  Left Ear: External ear normal.  Mouth/Throat: Oropharynx is clear and moist.  Eyes: Conjunctivae are normal. Pupils are equal, round, and reactive to light.  Neck: Normal range of motion. Neck supple. No thyromegaly present.  Cardiovascular: Normal rate, regular rhythm, normal heart sounds and intact distal pulses.   No murmur heard. Pulmonary/Chest: Effort normal and breath sounds normal. No respiratory distress. She has no wheezes.  Abdominal: Soft. Bowel sounds are normal. There is no tenderness.  Musculoskeletal: Normal range of motion. She exhibits no edema or tenderness.  Lymphadenopathy:    She has no cervical adenopathy.  Neurological: She is alert and oriented to person, place, and time.  Skin: Skin is warm and dry. No rash noted.  Psychiatric: She has a normal mood and affect. Her behavior is normal. Thought content normal.  Vitals reviewed.   EKG Reading: Sinus rhythm, no acute findings Assessment & Plan:   Melinda [name] is a 65 y.o. female Annual physical exam  --anticipatory guidance as below in AVS, screening labs above. Health maintenance items as above in HPI discussed/recommended as applicable.   Screening for hyperlipidemia - Plan: Lipid panel  Screening, anemia, deficiency, iron - Plan: CBC  Chest pressure - Plan: EKG 12-Lead  -No concerning findings on EKG. Fleeting symptoms. Asymptomatic  currently, RTC/ER precautions if recur as may need further testing.  Thyroid disorder screen - Plan: TSH  Screening for diabetes mellitus - Plan: COMPLETE METABOLIC PANEL WITH GFR  Abnormal mammogram of left breast  -I expressed my concern over the previous abnormal mammogram and ultrasound as well as previous recommendations for biopsy. Discussed this was also the recommendation of her OB/GYN, as a breast cancer could be present.  She declined repeat ultrasound and mammogram at this time as she wants to discuss this with her other physician and possible ultrasound only. Discussed this may not be sufficient for evaluation and that a mass or breast cancer was possible in that area that could worsen without treatment. Understanding expressed. She can call me if she changes her mind on that testing or referral to a  breast surgeon for second opinion if she would prefer.  Elevated blood pressure - Plan: EKG 12-Lead, COMPLETE METABOLIC PANEL WITH GFR  - Suspect white coat component, monitor home readings, and if remaining over 140/90, return to discuss medication.   Patient Instructions       IF you received an x-ray today, you will receive an invoice from [facility]. Please contact [facility]iology at [phone] with questions or concerns regarding your invoice.   IF you received labwork today, you will receive an invoice from [employer]. Please contact [facility] at [phone] with questions or concerns regarding your invoice.   Our billing staff will not be able to assist you with questions regarding bills from these companies.  You will be contacted with the lab results as soon as they are available. The fastest way to get your results is to activate your My Chart account. Instructions are located on the last page of this paperwork. If you have not heard from [place] regarding the results in 2 weeks, please contact this office.    We recommend that you  schedule a mammogram for breast cancer screening. Typically, you do not need a referral to do this. Please contact a local imaging center to schedule your mammogram.  [facility] - [phone]  *ask for the Radiology [place] ([place]) - [phone] or [phone]  [facility] - [phone] [place] [phone] [facility] - [phone]  *ask for the [facility] - [phone]  *ask for the Radiology Department [facility] - [phone]  *ask for the [place] - [phone]  Keep a record of your blood pressures outside of the office and if remain over 140/90 - return to discuss other evaluation or treatment.   I do recommend the ultrasound and mammogram for the abnormal area on your left breast as seen 4 years ago. If abnormality still there, a biopsy is recommended. You can discuss this with Dr. Sharol Roussel, but as we discussed this area did need follow up and could be a breast cancer as discussed by Dr. Phineas Real when you had your previous studies. Let me know when I can order these studies or let me know if there are other questions in the meantime.    Keeping You Healthy  Get These Tests  Blood Pressure- Have your blood pressure checked by your healthcare provider at least once a year.  Normal blood pressure is 120/80.  Weight- Have your body mass index (BMI) calculated to screen for obesity.  BMI is a measure of body fat based on height and weight.  You can calculate your own BMI at [employer].it  Cholesterol- Have your cholesterol checked every year.  Diabetes- Have your blood sugar checked every year if you have high blood pressure, high cholesterol, a family history of diabetes or if you are overweight.  Pap Test - Have a pap test every 1 to 5 years if you have been sexually active.  If you are older  than 65 and recent pap tests have been normal you may not need additional pap tests.  In addition, if you have had a hysterectomy  for benign disease additional pap tests are not necessary.  Mammogram-Yearly mammograms are essential for early detection of breast cancer  Screening for Colon Cancer- Colonoscopy starting at age 28. Screening may begin sooner depending on your family history and other health conditions.  Follow up colonoscopy as directed by your Gastroenterologist.  Screening for Osteoporosis- Screening begins at age 57 with bone density scanning, sooner if you are at higher risk for developing Osteoporosis.  Get these medicines  Calcium with Vitamin D- Your body requires 1200-1500 mg of Calcium a day and [phone] IU of Vitamin D a day.  You can only absorb 500 mg of Calcium at a time therefore Calcium must be taken in 2 or 3 separate doses throughout the day.  Hormones- Hormone therapy has been associated with increased risk for certain cancers and heart disease.  Talk to your healthcare provider about if you need relief from menopausal symptoms.  Aspirin- Ask your healthcare provider about taking Aspirin to prevent Heart Disease and Stroke.  Get these Immuniztions  Flu shot- Every fall  Pneumonia shot- Once after the age of 63; if you are younger ask your healthcare provider if you need a pneumonia shot.  Tetanus- Every ten years.  Zostavax- Once after the age of 77 to prevent shingles.  Take these steps  Don't smoke- Your healthcare provider can help you quit. For tips on how to quit, ask your healthcare provider or go to www.smokefree.gov or call 1-800 QUIT-NOW.  Be physically active- Exercise 5 days a week for a minimum of 30 minutes.  If you are not already physically active, start slow and gradually work up to 30 minutes of moderate physical activity.  Try walking, dancing, bike riding, swimming, etc.  Eat a healthy diet- Eat a variety of healthy foods such as  fruits, vegetables, whole grains, low fat milk, low fat cheeses, yogurt, lean meats, chicken, fish, eggs, dried beans, tofu, etc.  For more information go to www.thenutritionsource.org  Dental visit- Brush and floss teeth twice daily; visit your dentist twice a year.  Eye exam- Visit your Optometrist or Ophthalmologist yearly.  Drink alcohol in moderation- Limit alcohol intake to one drink or less a day.  Never drink and drive.  Depression- Your emotional health is as important as your physical health.  If you're feeling down or losing interest in things you normally enjoy, please talk to your healthcare provider.  Seat Belts- can save your life; always wear one  Smoke/Carbon Monoxide detectors- These detectors need to be installed on the appropriate level of your home.  Replace batteries at least once a year.  Violence- If anyone is threatening or hurting you, please tell your healthcare provider. Living Will/ Health care power of attorney- Discuss with your healthcare provider and family.     I personally performed the services described in this documentation, which was scribed in my presence. The recorded information has been reviewed and considered, and addended by me as needed.   Signed,   [name], MD [facility].  01/23/2016 9:37 AM

## 2016-01-21 NOTE — Patient Instructions (Addendum)
IF you received an x-ray today, you will receive an invoice from [facility] Radiology. Please contact [facility] at [phone] with questions or concerns regarding your invoice.   IF you received labwork today, you will receive an invoice from [employer]. Please contact [facility] at [phone] with questions or concerns regarding your invoice.   Our billing staff will not be able to assist you with questions regarding bills from these companies.  You will be contacted with the lab results as soon as they are available. The fastest way to get your results is to activate your My Chart account. Instructions are located on the last page of this paperwork. If you have not heard from [place] regarding the results in 2 weeks, please contact this office.    We recommend that you schedule a mammogram for breast cancer screening. Typically, you do not need a referral to do this. Please contact a local imaging center to schedule your mammogram.  [facility] - [phone]  *ask for the Radiology [place] ([place]) - [phone] or [phone]  [facility] - [phone] [place] [phone] [facility] - [phone]  *ask for the [facility] - [phone]  *ask for the Radiology Department [facility] - [phone]  *ask for the [place] - [phone]  Keep a record of your blood pressures outside of the office and if remain over 140/90 - return to discuss other evaluation or treatment.   I do recommend the ultrasound and mammogram for the abnormal area on your left breast as seen 4 years ago. If abnormality still there, a biopsy is recommended. You can discuss this with Dr. Sharol Roussel, but as we discussed this area did need follow up and could be a breast cancer as discussed by Dr. Phineas Real  when you had your previous studies. Let me know when I can order these studies or let me know if there are other questions in the meantime.    Keeping You Healthy  Get These Tests  Blood Pressure- Have your blood pressure checked by your healthcare provider at least once a year.  Normal blood pressure is 120/80.  Weight- Have your body mass index (BMI) calculated to screen for obesity.  BMI is a measure of body fat based on height and weight.  You can calculate your own BMI at [employer].it  Cholesterol- Have your cholesterol checked every year.  Diabetes- Have your blood sugar checked every year if you have high blood pressure, high cholesterol, a family history of diabetes or if you are overweight.  Pap Test - Have a pap test every 1 to 5 years if you have been sexually active.  If you are older than 65 and recent pap tests have been normal you may not need additional pap tests.  In addition, if you have had a hysterectomy  for benign disease additional pap tests are not necessary.  Mammogram-Yearly mammograms are essential for early detection of breast cancer  Screening for Colon Cancer- Colonoscopy starting at age 56. Screening may begin sooner depending on your family history and other health conditions.  Follow up colonoscopy as directed by your Gastroenterologist.  Screening for Osteoporosis- Screening begins at age 20 with bone density scanning, sooner if you are at higher risk for developing Osteoporosis.  Get these medicines  Calcium with Vitamin D- Your body requires 1200-1500 mg of Calcium  a day and [phone] IU of Vitamin D a day.  You can only absorb 500 mg of Calcium at a time therefore Calcium must be taken in 2 or 3 separate doses throughout the day.  Hormones- Hormone therapy has been associated with increased risk for certain cancers and heart disease.  Talk to your healthcare provider about if you need relief from menopausal symptoms.  Aspirin- Ask your  healthcare provider about taking Aspirin to prevent Heart Disease and Stroke.  Get these Immuniztions  Flu shot- Every fall  Pneumonia shot- Once after the age of 76; if you are younger ask your healthcare provider if you need a pneumonia shot.  Tetanus- Every ten years.  Zostavax- Once after the age of 44 to prevent shingles.  Take these steps  Don't smoke- Your healthcare provider can help you quit. For tips on how to quit, ask your healthcare provider or go to www.smokefree.gov or call 1-800 QUIT-NOW.  Be physically active- Exercise 5 days a week for a minimum of 30 minutes.  If you are not already physically active, start slow and gradually work up to 30 minutes of moderate physical activity.  Try walking, dancing, bike riding, swimming, etc.  Eat a healthy diet- Eat a variety of healthy foods such as fruits, vegetables, whole grains, low fat milk, low fat cheeses, yogurt, lean meats, chicken, fish, eggs, dried beans, tofu, etc.  For more information go to www.thenutritionsource.org  Dental visit- Brush and floss teeth twice daily; visit your dentist twice a year.  Eye exam- Visit your Optometrist or Ophthalmologist yearly.  Drink alcohol in moderation- Limit alcohol intake to one drink or less a day.  Never drink and drive.  Depression- Your emotional health is as important as your physical health.  If you're feeling down or losing interest in things you normally enjoy, please talk to your healthcare provider.  Seat Belts- can save your life; always wear one  Smoke/Carbon Monoxide detectors- These detectors need to be installed on the appropriate level of your home.  Replace batteries at least once a year.  Violence- If anyone is threatening or hurting you, please tell your healthcare provider. Living Will/ Health care power of attorney- Discuss with your healthcare provider and family.

## 2016-01-22 LAB — LIPID PANEL
CHOLESTEROL: 233 mg/dL — AB (ref 125–200)
HDL: 75 mg/dL (ref >=46)
LDL Cholesterol: 142 mg/dL — ABNORMAL HIGH (ref <130)
Total CHOL/HDL Ratio: 3.1 Ratio (ref <=5.0)
Triglycerides: 79 mg/dL (ref <150)
VLDL: 16 mg/dL (ref <30)

## 2016-01-22 LAB — COMPLETE METABOLIC PANEL WITH GFR
ALT: 19 U/L (ref 6–29)
AST: 21 U/L (ref 10–35)
Albumin: 4.6 g/dL (ref 3.6–5.1)
Alkaline Phosphatase: 67 U/L (ref 33–130)
BILIRUBIN TOTAL: 0.6 mg/dL (ref 0.2–1.2)
BUN: 12 mg/dL (ref 7–25)
CHLORIDE: 106 mmol/L (ref 98–110)
CO2: 24 mmol/L (ref 20–31)
Calcium: 9.5 mg/dL (ref 8.6–10.4)
Creat: 0.76 mg/dL (ref 0.50–0.99)
GFR, EST NON AFRICAN AMERICAN: 84 mL/min (ref >=60)
Glucose, Bld: 81 mg/dL (ref 65–99)
POTASSIUM: 3.7 mmol/L (ref 3.5–5.3)
Sodium: 140 mmol/L (ref 135–146)
TOTAL PROTEIN: 7.3 g/dL (ref 6.1–8.1)

## 2016-01-22 LAB — TSH: TSH: 3.1 m[IU]/L

## 2016-02-02 ENCOUNTER — Telehealth: Payer: Self-pay | Admitting: Emergency Medicine

## 2016-02-02 NOTE — Telephone Encounter (Signed)
Left message for pt to call for results and lab letter sent

## 2016-02-02 NOTE — Telephone Encounter (Signed)
-----   Message from Wendie Agreste, MD sent at 02/01/2016  8:22 AM EDT ----- Call patient or lab letter: Kidney tests, liver function tests, electrolytes, blood counts and thyroid test at last visit were overall in normal range. Cholesterol was elevated, but similar numbers as when checked 3 years ago. Can initially work on diet, exercise, and if you would like to try red yeast rice, this may also be helpful. Recommend recheck in the next 6 months. Let me know if you have any questions.

## 2016-02-03 ENCOUNTER — Telehealth: Payer: Self-pay

## 2017-05-02 IMAGING — CR DG KNEE 1-2V*L*
2 series · 2 of 2 positions shown · non-contrast
Comparison: None.

CLINICAL DATA: Pain following fall 1 day prior

EXAM:
LEFT KNEE - 1-2 VIEW

[AP]
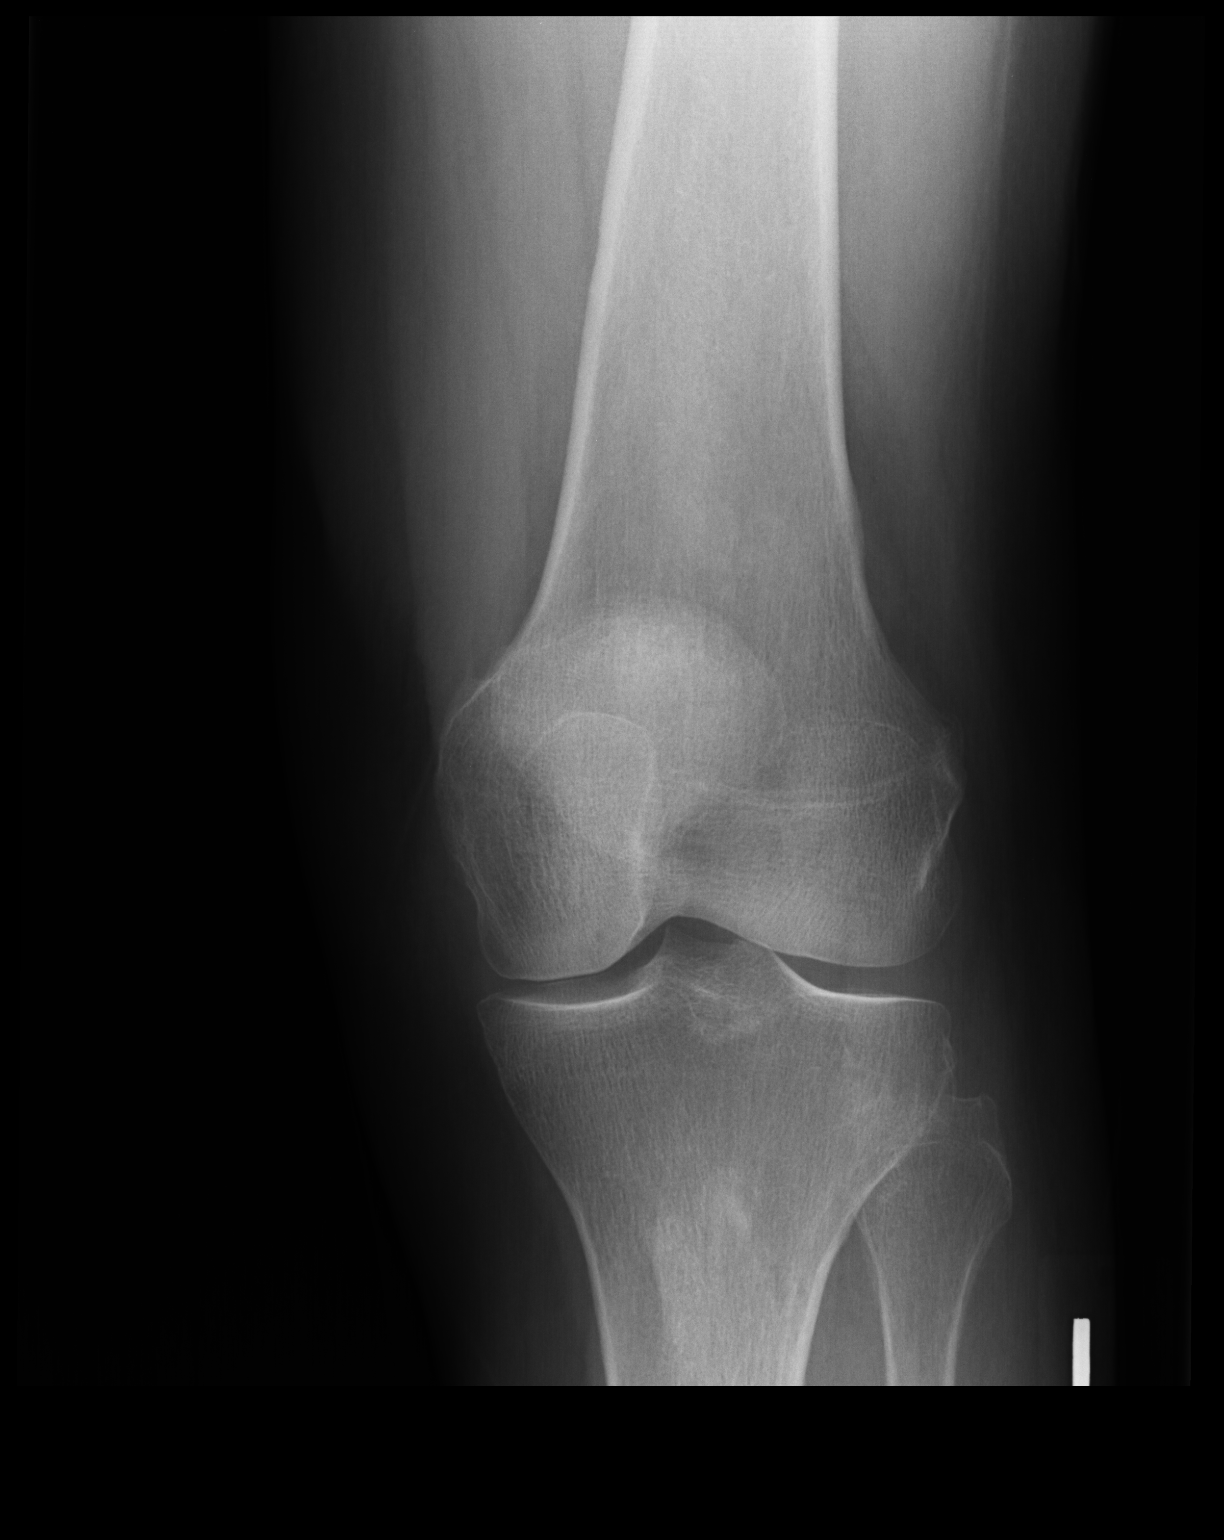

[lateral]
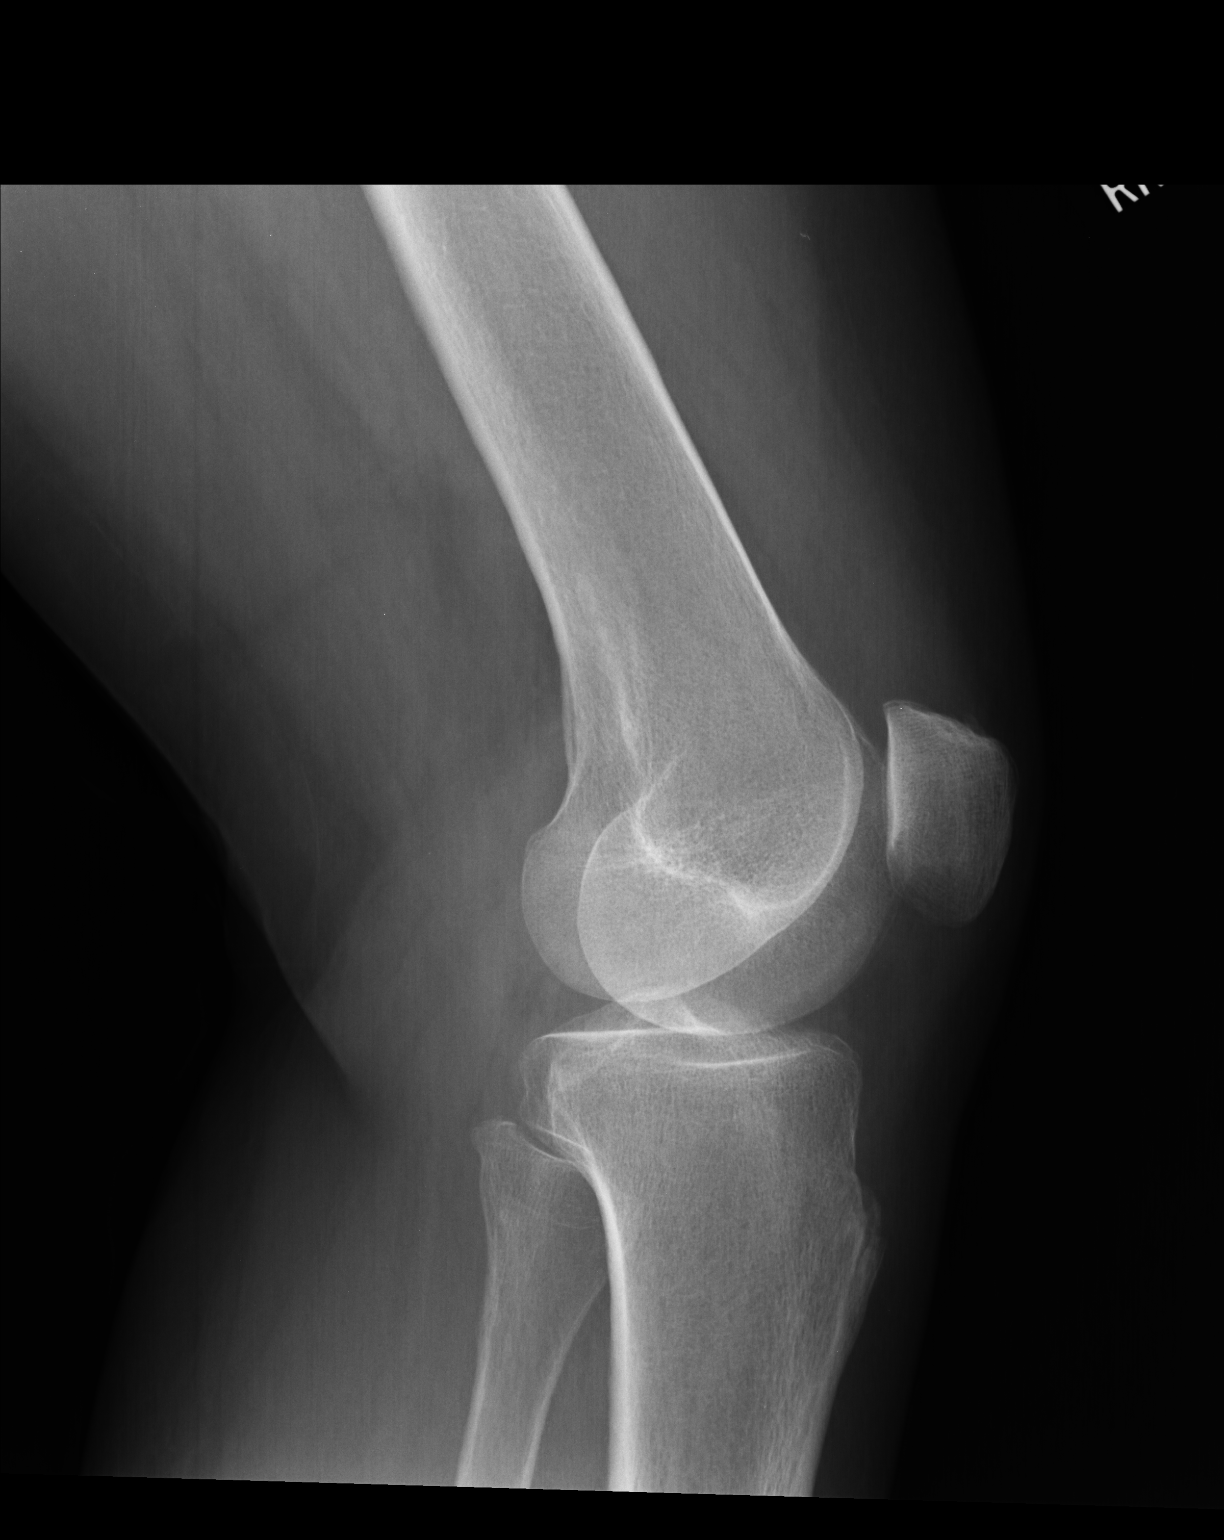

[2 of 2 positions shown; findings below may reference images not displayed]

FINDINGS: Frontal and lateral views were obtained. There is no fracture or
dislocation. There is equivocal medial patellar subluxation. No
appreciable joint effusion. The joint spaces appear normal. No
erosive change.
IMPRESSION: No apparent fracture or joint effusion. Rather equivocal medial
patellar subluxation. No appreciable arthropathic change.

## 2017-05-02 IMAGING — CR DG FOOT COMPLETE 3+V*L*
3 series · 3 of 3 positions shown · non-contrast
Comparison: None.

CLINICAL DATA: Pain following fall 1 day prior

EXAM:
LEFT FOOT - COMPLETE 3+ VIEW

[AP]
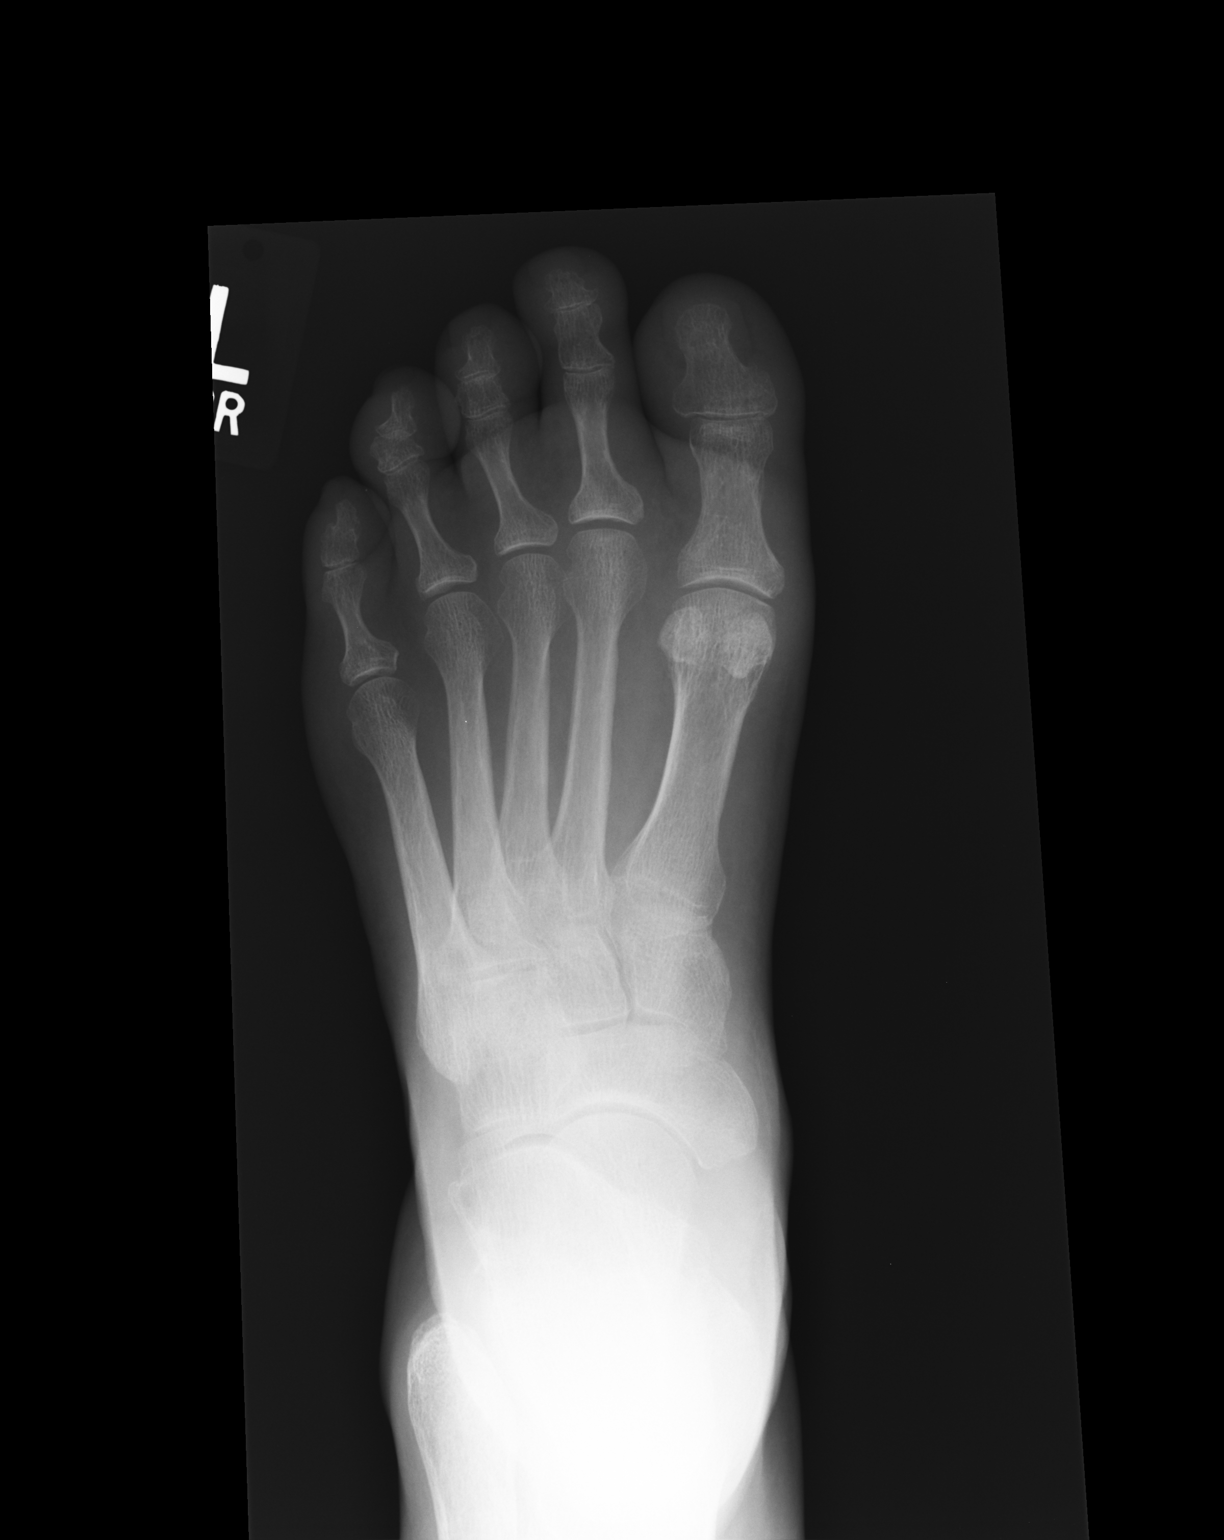

[ap obl int rot]
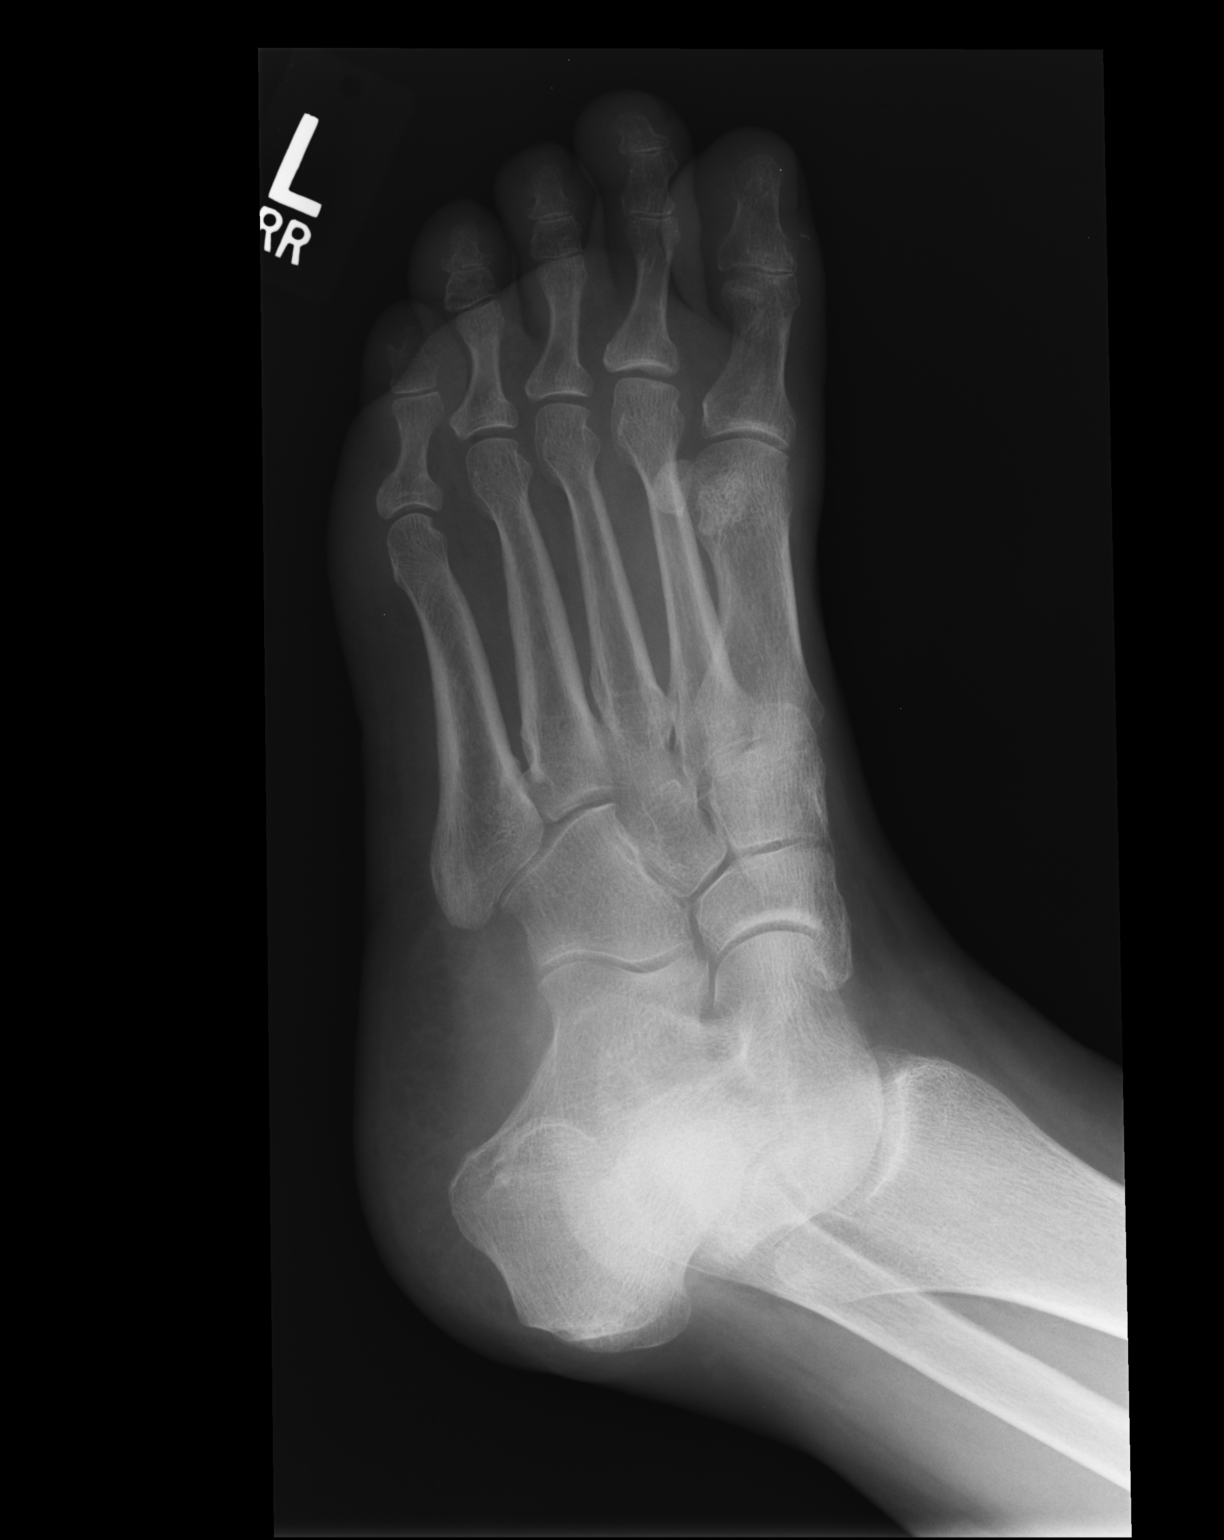

[lateral]
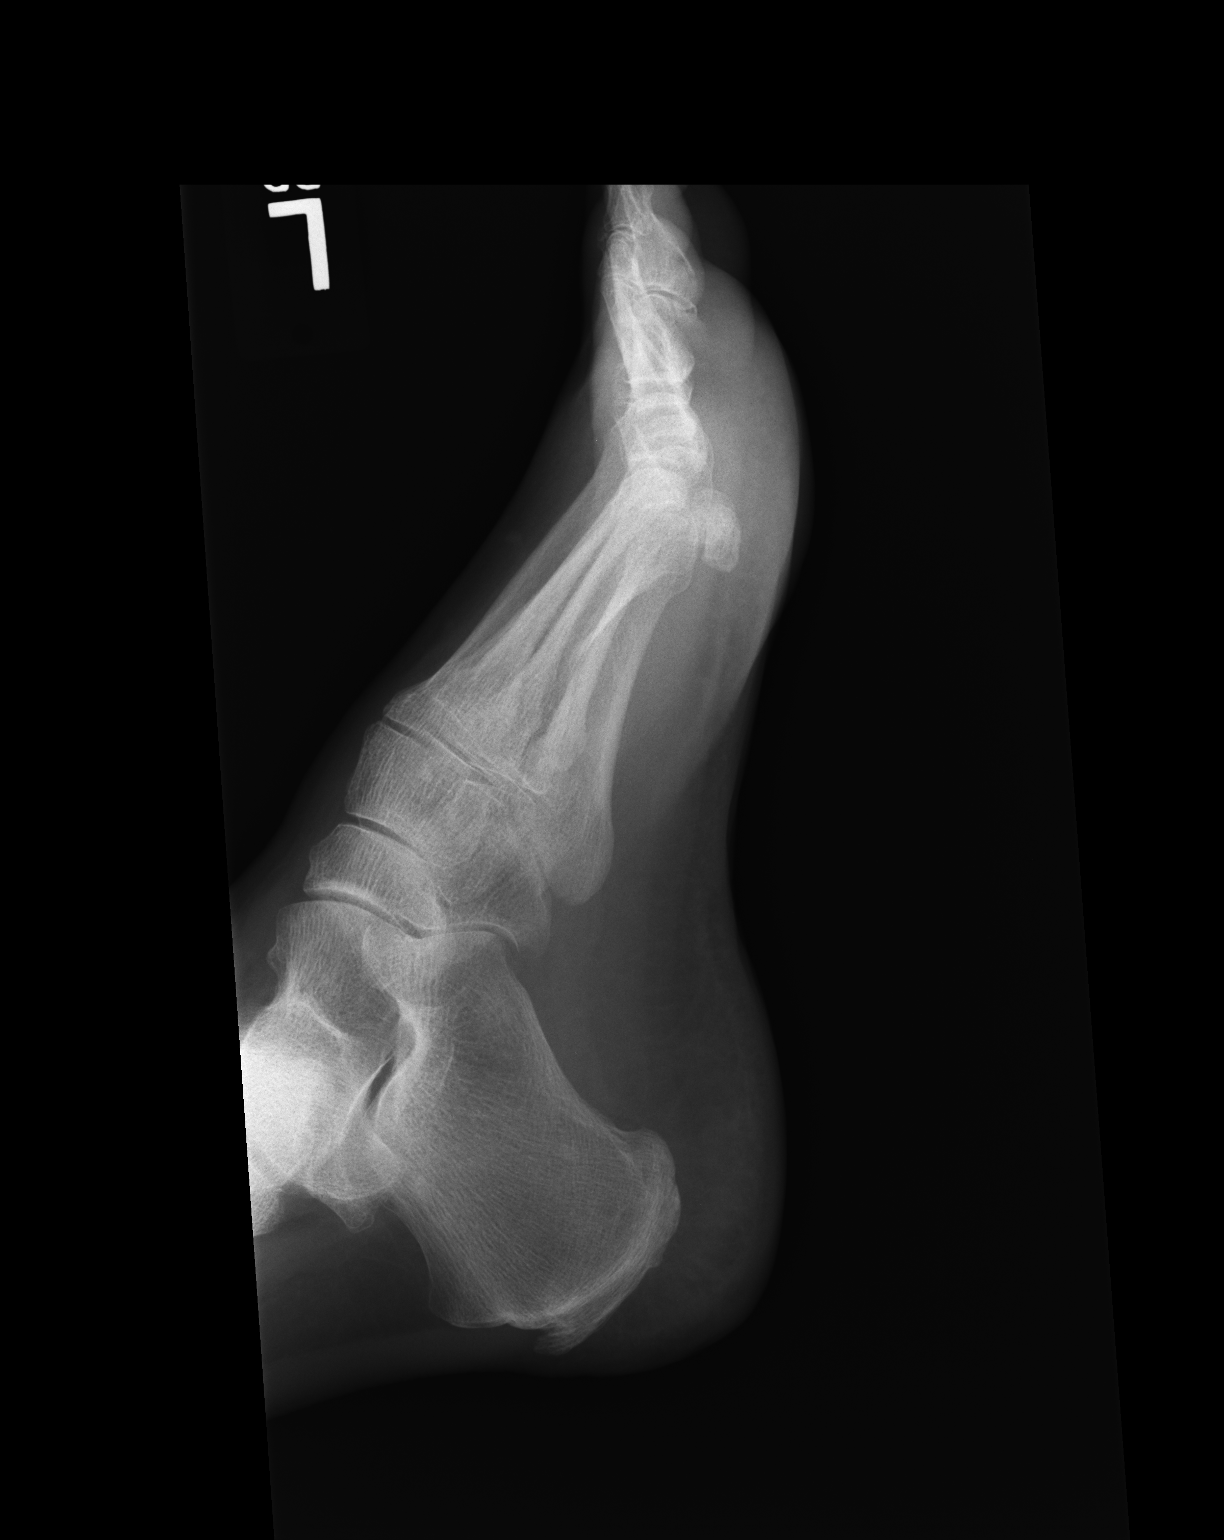

[3 of 3 positions shown; findings below may reference images not displayed]

FINDINGS: Frontal, oblique, and lateral views were obtained. There is no
fracture or dislocation. The joint spaces appear unremarkable. No
erosive change. There is a spur arising from the posterior
calcaneus. There is mild Adilson Domingos Milda.
IMPRESSION: No fracture or dislocation. No appreciable joint space narrowing.
Mild Adilson Domingos Milda. There is a posterior calcaneal spur.

## 2017-11-02 ENCOUNTER — Ambulatory Visit (INDEPENDENT_AMBULATORY_CARE_PROVIDER_SITE_OTHER): Payer: Self-pay | Admitting: Internal Medicine

## 2017-11-02 VITALS — BP 156/96 | HR 66 | Ht 67.0 in | Wt 167.0 lb

## 2017-11-02 DIAGNOSIS — R002 Palpitations: Secondary | ICD-10-CM

## 2017-11-02 DIAGNOSIS — R030 Elevated blood-pressure reading, without diagnosis of hypertension: Secondary | ICD-10-CM | POA: Insufficient documentation

## 2017-11-02 LAB — BASIC METABOLIC PANEL
BUN/Creatinine Ratio: 14 (ref 12–28)
BUN: 12 mg/dL (ref 8–27)
CO2: 23 mmol/L (ref 20–29)
CREATININE: 0.85 mg/dL (ref 0.57–1.00)
Calcium: 9.6 mg/dL (ref 8.7–10.3)
Chloride: 104 mmol/L (ref 96–106)
GFR calc Af Amer: 84 mL/min/{1.73_m2} (ref >59)
GFR calc non Af Amer: 73 mL/min/{1.73_m2} (ref >59)
GLUCOSE: 88 mg/dL (ref 65–99)
Potassium: 4.1 mmol/L (ref 3.5–5.2)
SODIUM: 142 mmol/L (ref 134–144)

## 2017-11-02 LAB — MAGNESIUM: MAGNESIUM: 2.3 mg/dL (ref 1.6–2.3)

## 2017-11-02 LAB — TSH: TSH: 1.92 u[IU]/mL (ref 0.450–4.500)

## 2017-11-02 NOTE — Patient Instructions (Addendum)
Medication Instructions:  Your physician recommends that you continue on your current medications as directed. Please refer to the Current Medication list given to you today.  -- If you need a refill on your cardiac medications before your next appointment, please call your pharmacy. --  Labwork: BMP TSH MAGNESIUM   Testing/Procedures:  CARDIAC EVENT monitor 30 day  Follow-Up: Your physician wants you to follow-up in: 2 months with Dr. Saunders Revel.    Thank you for choosing [facility]!!    Any Other Special Instructions Will Be Listed Below (If Applicable).

## 2017-11-02 NOTE — Progress Notes (Signed)
New Outpatient Visit Date: 11/02/2017  Referring Provider: Lollie Sails, MD [place], [place] [address]  Chief Complaint: Palpitation  HPI:  Melinda [name] is a 67 y.o. female who presents today as a self-referral for evaluation of palpitations. Since early December, Melinda [name] has had intermittent episodes of palpitations that she describes as a "bubbling" sensation in the center of her chest. The episodes last several hours and are not accompanied by any other symptoms. There are no clear precipitants or alleviating factors. This week, she has had the palpitations on back-to-back today. 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  [name] denies a history of cardiac diease and prior heart testing. She previously followed with Dr. Sharol Roussel he mentioned possible hormone problems to her. She takes numerous vitamins and supplements; her only prescription medication is potassium chloride. Ms. Duclos denies CP, shortness of breath, edema, lightheadedness, and orthopnea. The consumes 1-2 cups of "half-caff" coffee every morning. She often fasts 10-16 hours a day and wonders if that could be contributing to her palpitations.  --------------------------------------------------------------------------------------------------  Cardiovascular History & Procedures: Cardiovascular Problems:  Palpitations  Risk Factors:  None  Cath/PCI:  None  CV Surgery:  None  EP Procedures and Devices:  None  Non-Invasive Evaluation(s):  None  Recent CV Pertinent Labs: Lab Results  Component Value Date   CHOL 233 (H) 01/21/2016   HDL 75 01/21/2016   LDLCALC 142 (H) 01/21/2016   TRIG 79 01/21/2016   CHOLHDL 3.1 01/21/2016   K 3.7 01/21/2016   BUN 12 01/21/2016   CREATININE 0.76 01/21/2016     --------------------------------------------------------------------------------------------------  Past Medical History:  Diagnosis Date  . Arthritis   . Back problem   . Cataracts, bilateral   . Chicken pox   . Disorder of adrenal gland ([place])   . Disorders of sulfur-bearing amino-acid metabolism, unspecified ([place])   . Displaced fracture of base of unspecified metacarpal bone, initial encounter for closed fracture   . Gallstones   . Hyperlipidemia   . Hypertension   . Insomnia   . Iron deficiency anemia, unspecified   . Measles   . Mumps   . Sciatica   . Sensitivity to medication   . Skin problem   . Sprain of unspecified ligament of right ankle, subsequent encounter   . Vitamin D deficiency, unspecified     Past Surgical History:  Procedure Laterality Date  . NO PAST SURGERIES      Current Meds  Medication Sig  . Ascorbic Acid (VITAMIN C) 100 MG tablet Take 2,000 mg by mouth daily.  . cholecalciferol (VITAMIN D) 1000 UNITS tablet Take 1,000 Units by mouth daily. Reported on 01/21/2016  . Cyanocobalamin (VITAMIN B-12 PO) Take by mouth.  . Docosahexaenoic Acid (DHA OMEGA 3 PO) Take by mouth. Reported on 01/21/2016  . Ergocalciferol (VITAMIN D2 PO) Take 1,000 Units by mouth. Reported on 01/21/2016  . GLYCINE PO Take 500 mg by mouth. Take 4 tabs at bedtime on an empty stomach or open and melt . May repeat in the middle of the night if needed  . Iodine Strong, Lugols, (IODINE STRONG PO) Take 400 mcg by mouth.  . IRON PO Take by mouth.  . magnesium citrate SOLN Take 1 Bottle by mouth once.  . Multiple Vitamin (MULTIVITAMIN) tablet Take 1 tablet by mouth daily.  . Multiple Vitamins-Minerals (ZINC PO) Take  by mouth.  . NON FORMULARY P 5 P 50 mg  1 tablet twice a day  . NON FORMULARY   . Nutritional Supplements (DETOX ENZYME FORMULA PO) Take by mouth. Reported on 01/21/2016  . OVER THE COUNTER MEDICATION daily.  Marland Kitchen OVER THE COUNTER MEDICATION as needed.  Marland Kitchen OVER THE COUNTER  MEDICATION daily.  Marland Kitchen OVER THE COUNTER MEDICATION daily.  Marland Kitchen OVER THE COUNTER MEDICATION daily.  . TURMERIC PO Take 600 mg by mouth.  Marland Kitchen ZINC GLUCONATE ER PO Take by mouth.    Allergies: Patient has no known allergies.  Social History   Socioeconomic History  . Marital status: Married    Spouse name: Not on file  . Number of children: Not on file  . Years of education: Not on file  . Highest education level: Not on file  Occupational History  . Occupation: Ecologist  . Occupation: TEACHER  Social Needs  . Financial resource strain: Not on file  . Food insecurity:    Worry: Not on file    Inability: Not on file  . Transportation needs:    Medical: Not on file    Non-medical: Not on file  Tobacco Use  . Smoking status: Never Smoker  . Smokeless tobacco: Never Used  Substance and Sexual Activity  . Alcohol use: Yes    Alcohol/week: 0.0 oz    Comment: rare - wine  . Drug use: No  . Sexual activity: Never  Lifestyle  . Physical activity:    Days per week: Not on file    Minutes per session: Not on file  . Stress: Not on file  Relationships  . Social connections:    Talks on phone: Not on file    Gets together: Not on file    Attends religious service: Not on file    Active member of club or organization: Not on file    Attends meetings of clubs or organizations: Not on file    Relationship status: Not on file  . Intimate partner violence:    Fear of current or ex partner: Not on file    Emotionally abused: Not on file    Physically abused: Not on file    Forced sexual activity: Not on file  Other Topics Concern  . Not on file  Social History Narrative  . Not on file    Family History  Problem Relation Age of Onset  . Heart disease Mother   . Hypertension Mother   . Hypothyroidism Mother   . Cancer Father        bone marrow   . Heart disease Maternal Grandfather        MI in 8's  . Hypertension Maternal Grandfather   . Glaucoma Maternal  Grandmother   . Mental illness Paternal Grandmother   . Diabetes Paternal Grandfather   . Angina Paternal Grandfather   . Heart disease Brother        arrithymia  . Cancer Paternal Uncle        melanoma age 51  . Mental illness Paternal Aunt   . Autoimmune disease Maternal Uncle   . Prostate cancer Maternal Uncle   . Kidney Stones Maternal Uncle   . Stroke Maternal Uncle   . Parkinson's disease Maternal Aunt   . Hypertension Maternal Aunt   . Glaucoma Maternal Aunt     Review of Systems: A 12-system review of systems was performed and was negative except as noted in the HPI.  --------------------------------------------------------------------------------------------------  Physical Exam: BP (!) 156/96   Pulse 66   Ht 5\' 7"  (1.702 m)   Wt 167 lb (75.8 kg)   LMP 08/04/2009   SpO2 98%   BMI 26.16 kg/m   General:  NAD. Accompanied by her husband HEENT: No conjunctival pallor or scleral icterus. Moist mucous membranes. OP clear. Neck: Supple without lymphadenopathy, thyromegaly, JVD, or HJR. No carotid bruit. Lungs: Normal work of breathing. Clear to auscultation bilaterally without wheezes or crackles. Heart: Regular rate and rhythm without murmurs, rubs, or gallops. Non-displaced PMI. Abd: Bowel sounds present. Soft, NT/ND without hepatosplenomegaly Ext: No lower extremity edema. Radial, PT, and DP pulses are 2+ bilaterally Skin: Warm and dry without rash. Neuro: CNIII-XII intact. Strength and fine-touch sensation intact in upper and lower extremities bilaterally. Psych: Normal mood and affect.  EKG:  NSR without abnormalities  Lab Results  Component Value Date   WBC 4.3 01/21/2016   HGB 13.4 01/21/2016   HCT 40.9 01/21/2016   MCV 85.4 01/21/2016   PLT 173 01/21/2016    Lab Results  Component Value Date   NA 140 01/21/2016   K 3.7 01/21/2016   CL 106 01/21/2016   CO2 24 01/21/2016   BUN 12 01/21/2016   CREATININE 0.76 01/21/2016   GLUCOSE 81 01/21/2016    ALT 19 01/21/2016    Lab Results  Component Value Date   CHOL 233 (H) 01/21/2016   HDL 75 01/21/2016   LDLCALC 142 (H) 01/21/2016   TRIG 79 01/21/2016   CHOLHDL 3.1 01/21/2016     --------------------------------------------------------------------------------------------------  ASSESSMENT AND PLAN: Palpitations Intermittent since December but seemingly more frequent this month. No clear precipitants identified by Ms. Lasseigne. There are no worrisome symptoms that accompany the palpitations. We will check a BMP, magnesium, and TSH, as well as obtain a 30-day event monitor. I have encouraged Ms. Hice to limit her caffeine intake and also to avoid prolonged fasts.  Elevated blood pressure No history of hypertension, though Ms. Lonon reports "white coat hypertension." If her BP remains elevated at follow-up antihypertensive therapy will need to be instituted. I encouraged sodium restriction.  Follow-up: Return to clinic in 2 months.  Nelva Bush, MD 11/02/2017 10:21 AM

## 2017-11-09 ENCOUNTER — Ambulatory Visit (INDEPENDENT_AMBULATORY_CARE_PROVIDER_SITE_OTHER): Payer: Self-pay

## 2017-11-09 DIAGNOSIS — R002 Palpitations: Secondary | ICD-10-CM

## 2017-12-04 ENCOUNTER — Ambulatory Visit: Payer: Self-pay | Admitting: Internal Medicine

## 2017-12-13 ENCOUNTER — Telehealth: Payer: Self-pay | Admitting: Internal Medicine

## 2017-12-13 NOTE — Telephone Encounter (Signed)
Patient will call us tomorrow to receive results.  She is working at this time.

## 2017-12-13 NOTE — Telephone Encounter (Signed)
New message    Please call patient with monitor results Her spouse is calling for results, but he does does appear to be on DPR.

## 2017-12-14 NOTE — Telephone Encounter (Signed)
Spoke with patient and gave her results and recommendations.  She verbalized understanding.  Discussed teeth filling replacements and she thought this may be in relation to her cardiac condition.

## 2018-01-11 ENCOUNTER — Encounter (INDEPENDENT_AMBULATORY_CARE_PROVIDER_SITE_OTHER): Payer: Self-pay

## 2018-01-11 ENCOUNTER — Ambulatory Visit (INDEPENDENT_AMBULATORY_CARE_PROVIDER_SITE_OTHER): Payer: Self-pay | Admitting: Internal Medicine

## 2018-01-11 ENCOUNTER — Encounter: Payer: Self-pay | Admitting: Internal Medicine

## 2018-01-11 VITALS — BP 130/90 | HR 66 | Ht 67.0 in | Wt 164.6 lb

## 2018-01-11 DIAGNOSIS — I4729 Other ventricular tachycardia: Secondary | ICD-10-CM | POA: Insufficient documentation

## 2018-01-11 DIAGNOSIS — R002 Palpitations: Secondary | ICD-10-CM

## 2018-01-11 DIAGNOSIS — R030 Elevated blood-pressure reading, without diagnosis of hypertension: Secondary | ICD-10-CM

## 2018-01-11 DIAGNOSIS — I472 Ventricular tachycardia: Secondary | ICD-10-CM

## 2018-01-11 NOTE — Progress Notes (Signed)
Follow-up Outpatient Visit Date: 01/11/2018  Primary Care Provider: Patient, No Pcp Per No address on file  Chief Complaint: Palpitations  HPI:  Melinda [name] is a 67 y.o. year-old female with history of hypertension and hyperlipidemia who presents for follow-up of palpitations.  I met Ms. Ingham in late March, at which time she described a "bubbling" sensation in the center of her chest that could last up to several hours at a time.  We agreed to obtain a 30-day event monitor, which showed predominantly sinus rhythm with a single 4 beat run of NSVT.  No symptoms were noted by the patient while wearing the monitor.  Today, the patient reports only one episode of palpitations since her last visit.  It occurred on the night before her daughter's wedding after she had eaten differently than usual and had also been without sleep for several days.  She felt a fluttering in her chest that lasted several hours while lying in bed.  There were no accompanying symptoms.  She otherwise has not had any palpitations.  She denies chest pain, shortness of breath, lightheadedness, orthopnea, PND, and edema.  She is trying to lose weight and watch her diet.  Unfortunately, she injured her ankle while running and has not been able to exercise as much recently.  --------------------------------------------------------------------------------------------------  Cardiovascular History & Procedures: Cardiovascular Problems:  Palpitations and NSVT  Risk Factors:  None  Cath/PCI:  None  CV Surgery:  None  EP Procedures and Devices:  30-day event monitor (11/09/2017): Predominantly sinus rhythm with a single 4 beat run of nonsustained ventricular tachycardia.  Non-Invasive Evaluation(s):  None   Recent CV Pertinent Labs: Lab Results  Component Value Date   CHOL 233 (H) 01/21/2016   HDL 75 01/21/2016   LDLCALC 142 (H) 01/21/2016   TRIG 79 01/21/2016   CHOLHDL 3.1 01/21/2016   K 4.1  11/02/2017   MG 2.3 11/02/2017   BUN 12 11/02/2017   CREATININE 0.85 11/02/2017   CREATININE 0.76 01/21/2016    Past medical and surgical history were reviewed and updated in EPIC.  Current Meds  Medication Sig  . Ascorbic Acid (VITAMIN C) 100 MG tablet Take 2,000 mg by mouth daily.  . cholecalciferol (VITAMIN D) 1000 UNITS tablet Take 1,000 Units by mouth daily. Reported on 01/21/2016  . Cyanocobalamin (VITAMIN B-12 PO) Take by mouth.  . Docosahexaenoic Acid (DHA OMEGA 3 PO) Take by mouth. Reported on 01/21/2016  . Ergocalciferol (VITAMIN D2 PO) Take 1,000 Units by mouth. Reported on 01/21/2016  . GLYCINE PO Take 500 mg by mouth. Take 4 tabs at bedtime on an empty stomach or open and melt . May repeat in the middle of the night if needed  . Iodine Strong, Lugols, (IODINE STRONG PO) Take 400 mcg by mouth.  . IRON PO Take by mouth.  . magnesium citrate SOLN Take 1 Bottle by mouth once.  . Multiple Vitamin (MULTIVITAMIN) tablet Take 1 tablet by mouth daily.  . Multiple Vitamins-Minerals (ZINC PO) Take by mouth.  . NON FORMULARY P 5 P 50 mg  1 tablet twice a day  . NON FORMULARY   . Nutritional Supplements (DETOX ENZYME FORMULA PO) Take by mouth. Reported on 01/21/2016  . OVER THE COUNTER MEDICATION daily.  Marland Kitchen OVER THE COUNTER MEDICATION as needed.  Marland Kitchen OVER THE COUNTER MEDICATION daily.  Marland Kitchen OVER THE COUNTER MEDICATION daily.  Marland Kitchen OVER THE COUNTER MEDICATION daily.  . TURMERIC PO Take 600 mg by mouth.  Marland Kitchen ZINC GLUCONATE  ER PO Take by mouth.    Allergies: Patient has no known allergies.  Social History   Tobacco Use  . Smoking status: Never Smoker  . Smokeless tobacco: Never Used  Substance Use Topics  . Alcohol use: Not Currently  . Drug use: No    Family History  Problem Relation Age of Onset  . Hypertension Mother   . Hypothyroidism Mother   . Heart failure Mother   . Stroke Mother   . Cancer Father        bone marrow   . Heart disease Maternal Grandfather        MI in 73's   . Hypertension Maternal Grandfather   . Glaucoma Maternal Grandmother   . Mental illness Paternal Grandmother   . Diabetes Paternal Grandfather   . Angina Paternal Grandfather   . Heart disease Brother        arrithymia  . Cancer Paternal Uncle        melanoma age 68  . Mental illness Paternal Aunt   . Autoimmune disease Maternal Uncle   . Prostate cancer Maternal Uncle   . Kidney Stones Maternal Uncle   . Stroke Maternal Uncle   . Parkinson's disease Maternal Aunt   . Hypertension Maternal Aunt   . Glaucoma Maternal Aunt     Review of Systems: A 12-system review of systems was performed and was negative except as noted in the HPI.  --------------------------------------------------------------------------------------------------  Physical Exam: BP 130/90   Pulse 66   Ht '5\' 7"'  (1.702 m)   Wt 164 lb 9.6 oz (74.7 kg)   LMP 08/04/2009   SpO2 98%   BMI 25.78 kg/m   General: NAD. HEENT: No conjunctival pallor or scleral icterus. Moist mucous membranes.  OP clear. Neck: Supple without lymphadenopathy, thyromegaly, JVD, or HJR.  Lungs: Normal work of breathing. Clear to auscultation bilaterally without wheezes or crackles. Heart: Regular rate and rhythm without murmurs, rubs, or gallops. Non-displaced PMI. Abd: Bowel sounds present. Soft, NT/ND without hepatosplenomegaly Ext: No lower extremity edema. Radial, PT, and DP pulses are 2+ bilaterally. Skin: Warm and dry without rash.  Lab Results  Component Value Date   WBC 4.3 01/21/2016   HGB 13.4 01/21/2016   HCT 40.9 01/21/2016   MCV 85.4 01/21/2016   PLT 173 01/21/2016    Lab Results  Component Value Date   NA 142 11/02/2017   K 4.1 11/02/2017   CL 104 11/02/2017   CO2 23 11/02/2017   BUN 12 11/02/2017   CREATININE 0.85 11/02/2017   GLUCOSE 88 11/02/2017   ALT 19 01/21/2016   Magnesium (11/02/17): 2.3  Lab Results  Component Value Date   CHOL 233 (H) 01/21/2016   HDL 75 01/21/2016   LDLCALC 142 (H)  01/21/2016   TRIG 79 01/21/2016   CHOLHDL 3.1 01/21/2016    --------------------------------------------------------------------------------------------------  ASSESSMENT AND PLAN: Palpitations and nonsustained ventricular tachycardia Ms. Matheny reports only one episode of palpitations that occurred in the setting of different diet than usual and also quite a bit of stress and exhaustion leading up to her daughter's wedding.  There were no worrisome accompanying symptoms.  30-day event monitor showed only a 4 beat run of NSVT.  I have recommended that we consider an echocardiogram to exclude structural abnormalities in the setting of NSVT.  However, Ms. Adkins would like to defer this.  I do not recommend any medication changes at this time.  I have encouraged her to limit her caffeine intake and to stay  well-hydrated.  She should let us know if her palpitations worsen, which time we would need to readdress performing echocardiogram.  Elevated blood pressure Systolic blood pressure better today, though the diastolic pressure is still borderline elevated.  I have encouraged Ms. Spackman to continue to lose weight and begin exercising again as her ankle allows.  She should also avoid sodium.  If blood pressure remains consistently above 140/90 in the future, pharmacotherapy will need to be considered.  I also encouraged Ms. Metheney to establish with a new PCP, as she will not be able to afford her current alternative medicine provider.  Follow-up: Return to clinic in 6 months.  Nelva Bush, MD 01/11/2018 10:40 AM

## 2018-01-11 NOTE — Patient Instructions (Addendum)
Medication Instructions:  Your physician recommends that you continue on your current medications as directed. Please refer to the Current Medication list given to you today.  -- If you need a refill on your cardiac medications before your next appointment, please call your pharmacy. --  Labwork: None ordered  Testing/Procedures: None ordered  Follow-Up:PLEASE give patient information on [facility] PCP care, thank you.   Your physician wants you to follow-up in: 6 MONTHS with Dr. Saunders Revel.    You will receive a reminder letter in the mail two months in advance. If you don't receive a letter, please call our office to schedule the follow-up appointment.  Thank you for choosing [facility]!!    Any Other Special Instructions Will Be Listed Below (If Applicable).

## 2018-08-07 DIAGNOSIS — H903 Sensorineural hearing loss, bilateral: Secondary | ICD-10-CM | POA: Diagnosis not present

## 2018-08-07 DIAGNOSIS — R42 Dizziness and giddiness: Secondary | ICD-10-CM | POA: Diagnosis not present

## 2018-08-07 DIAGNOSIS — H8123 Vestibular neuronitis, bilateral: Secondary | ICD-10-CM | POA: Diagnosis not present

## 2018-11-11 DIAGNOSIS — S60450A Superficial foreign body of right index finger, initial encounter: Secondary | ICD-10-CM | POA: Diagnosis not present

## 2018-11-13 ENCOUNTER — Ambulatory Visit: Payer: Self-pay | Admitting: Family Medicine

## 2019-01-29 ENCOUNTER — Ambulatory Visit: Payer: Self-pay | Admitting: Family Medicine

## 2019-02-07 DIAGNOSIS — R69 Illness, unspecified: Secondary | ICD-10-CM | POA: Diagnosis not present

## 2019-02-07 DIAGNOSIS — Z01419 Encounter for gynecological examination (general) (routine) without abnormal findings: Secondary | ICD-10-CM | POA: Diagnosis not present

## 2019-02-07 DIAGNOSIS — L292 Pruritus vulvae: Secondary | ICD-10-CM | POA: Diagnosis not present

## 2019-02-12 ENCOUNTER — Ambulatory Visit (INDEPENDENT_AMBULATORY_CARE_PROVIDER_SITE_OTHER): Payer: Medicare HMO | Admitting: Family Medicine

## 2019-02-12 ENCOUNTER — Encounter: Payer: Self-pay | Admitting: Family Medicine

## 2019-02-12 ENCOUNTER — Other Ambulatory Visit: Payer: Self-pay

## 2019-02-12 VITALS — BP 166/88 | HR 74 | Temp 97.4°F | Ht 66.5 in | Wt 167.0 lb

## 2019-02-12 DIAGNOSIS — R030 Elevated blood-pressure reading, without diagnosis of hypertension: Secondary | ICD-10-CM | POA: Diagnosis not present

## 2019-02-12 DIAGNOSIS — Z13220 Encounter for screening for lipoid disorders: Secondary | ICD-10-CM | POA: Diagnosis not present

## 2019-02-12 DIAGNOSIS — Z0000 Encounter for general adult medical examination without abnormal findings: Secondary | ICD-10-CM

## 2019-02-12 DIAGNOSIS — E876 Hypokalemia: Secondary | ICD-10-CM

## 2019-02-12 LAB — CBC WITH DIFFERENTIAL/PLATELET
Basophils Absolute: 0 10*3/uL (ref 0.0–0.1)
Basophils Relative: 0.9 % (ref 0.0–3.0)
Eosinophils Absolute: 0.1 10*3/uL (ref 0.0–0.7)
Eosinophils Relative: 2.8 % (ref 0.0–5.0)
HCT: 38.9 % (ref 36.0–46.0)
Hemoglobin: 12.9 g/dL (ref 12.0–15.0)
Lymphocytes Relative: 31 % (ref 12.0–46.0)
Lymphs Abs: 1.4 10*3/uL (ref 0.7–4.0)
MCHC: 33.1 g/dL (ref 30.0–36.0)
MCV: 88.8 fl (ref 78.0–100.0)
Monocytes Absolute: 0.5 10*3/uL (ref 0.1–1.0)
Monocytes Relative: 11.3 % (ref 3.0–12.0)
Neutro Abs: 2.4 10*3/uL (ref 1.4–7.7)
Neutrophils Relative %: 54 % (ref 43.0–77.0)
Platelets: 163 10*3/uL (ref 150.0–400.0)
RBC: 4.38 Mil/uL (ref 3.87–5.11)
RDW: 12.8 % (ref 11.5–15.5)
WBC: 4.4 10*3/uL (ref 4.0–10.5)

## 2019-02-12 LAB — COMPREHENSIVE METABOLIC PANEL
ALT: 37 U/L — ABNORMAL HIGH (ref 0–35)
AST: 32 U/L (ref 0–37)
Albumin: 4.8 g/dL (ref 3.5–5.2)
Alkaline Phosphatase: 78 U/L (ref 39–117)
BUN: 10 mg/dL (ref 6–23)
CO2: 29 mEq/L (ref 19–32)
Calcium: 9.7 mg/dL (ref 8.4–10.5)
Chloride: 105 mEq/L (ref 96–112)
Creatinine, Ser: 0.78 mg/dL (ref 0.40–1.20)
GFR: 73.97 mL/min (ref >60.00)
Glucose, Bld: 93 mg/dL (ref 70–99)
Potassium: 3.9 mEq/L (ref 3.5–5.1)
Sodium: 141 mEq/L (ref 135–145)
Total Bilirubin: 0.5 mg/dL (ref 0.2–1.2)
Total Protein: 7.2 g/dL (ref 6.0–8.3)

## 2019-02-12 LAB — LIPID PANEL
Cholesterol: 243 mg/dL — ABNORMAL HIGH (ref 0–200)
HDL: 64.1 mg/dL (ref >39.00)
LDL Cholesterol: 146 mg/dL — ABNORMAL HIGH (ref 0–99)
NonHDL: 178.81
Total CHOL/HDL Ratio: 4
Triglycerides: 165 mg/dL — ABNORMAL HIGH (ref 0.0–149.0)
VLDL: 33 mg/dL (ref 0.0–40.0)

## 2019-02-12 MED ORDER — POTASSIUM CHLORIDE ER 10 MEQ PO TBCR: 10.0000 meq | EXTENDED_RELEASE_TABLET | Freq: Two times a day (BID) | ORAL | 6 refills | Status: DC

## 2019-02-12 NOTE — Progress Notes (Signed)
New patient. Prefers Integrative Medicine, but okay with a DO. Previously saw Dr. Sharol Roussel. Pescatarian for the most part. Intermittent fasting with a 6 hour eating window. Hx of feeling sick after flu shot in the past, so declines flu shot. Saw GYN was last week, with PAP. Hep C in 2013. Hx of breast biopsy. Hx of hypokalemia, previously on KLC 10 mEQ daily. Saw Cardiology last year due to having palpitations. Now resolved. Right foot injury a few years ago. No lingering issues. Blood pressure elevated today. Patient wants to lose 20 pounds in order to get BP at goal. [facility] of HTN. Previous exercise included HIIT at the gym. During COVID, has started baking more and not exercising as much. Hx of gallstones and wonders what diet is best for her.   Subjective:   Melinda [name] is a 68 y.o. female who presents for an Initial Medicare Annual Wellness Visit.  Review of Systems  Constitutional: Negative for chills, fever, malaise/fatigue and weight loss.  Respiratory: Negative for cough, shortness of breath and wheezing.   Cardiovascular: Negative for chest pain, palpitations and leg swelling.  Gastrointestinal: Negative for abdominal pain, constipation, diarrhea, nausea and vomiting.  Genitourinary: Negative for dysuria and urgency.  Musculoskeletal: Negative for joint pain and myalgias.  Skin: Negative for rash.  Neurological: Negative for dizziness and headaches.  Psychiatric/Behavioral: Negative for depression, substance abuse and suicidal ideas. The patient is not nervous/anxious.    Cardiac Risk Factors include: advanced age (>50men, >9 women);hypertension  Objective:    Today's Vitals   02/12/19 0922  BP: (!) 166/88  Pulse: 74  Temp: (!) 97.4 F (36.3 C)  TempSrc: Oral  SpO2: 96%  Weight: 167 lb (75.8 kg)  Height: 5' 6.5" (1.689 m)   Body mass index is 26.55 kg/m.  Advanced Directives 02/12/2019  Does Patient Have a Medical Advance Directive? No  Would patient like information  on creating a medical advance directive? No - Patient declined   Current Medications (verified) .  Cyanocobalamin (VITAMIN B-12 PO), Take by mouth., Disp: , Rfl:  .  Docosahexaenoic Acid (DHA OMEGA 3 PO), Take by mouth. Reported on 01/21/2016, Disp: , Rfl:  .  GLYCINE PO, Take 500 mg by mouth. Take 4 tabs at bedtime on an empty stomach or open and melt . May repeat in the middle of the night if needed, Disp: , Rfl:  .  Iodine Strong, Lugols, (IODINE STRONG PO), Take 400 mcg by mouth., Disp: , Rfl:  .  IRON PO, Take by mouth., Disp: , Rfl:  .  magnesium citrate SOLN, Take 1 Bottle by mouth once., Disp: , Rfl:  .  Multiple Vitamin (MULTIVITAMIN) tablet, Take 1 tablet by mouth daily., Disp: , Rfl:  .  Multiple Vitamins-Minerals (ZINC PO), Take by mouth., Disp: , Rfl:  .  NON FORMULARY, P 5 P 50 mg  1 tablet twice a day, Disp: , Rfl:  .  NON FORMULARY, , Disp: , Rfl:  .  Nutritional Supplements (DETOX ENZYME FORMULA PO), Take by mouth. Reported on 01/21/2016, Disp: , Rfl:  .  OVER THE COUNTER MEDICATION, as needed., Disp: , Rfl:  .  OVER THE COUNTER MEDICATION, daily., Disp: , Rfl:  .  OVER THE COUNTER MEDICATION, daily., Disp: , Rfl:  .  OVER THE COUNTER MEDICATION, daily., Disp: , Rfl:   Allergies (verified) Patient has no known allergies.   History: Past Medical History:  Diagnosis Date  . Arthritis   . Back problem   .  Cataracts, bilateral   . Chicken pox   . 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 Disorders of sulfur-bearing amino-acid metabolism, unspecified ([place])   . Displaced fracture of base of unspecified metacarpal bone, initial encounter for closed fracture   . Gallstones   . Hyperlipidemia   . Hypertension   . Insomnia   . Iron deficiency anemia, unspecified   . Measles   . Mumps   . Sciatica   . Sensitivity to medication   . Skin problem   . Sprain of unspecified ligament of right ankle, subsequent encounter   . Vitamin D deficiency, unspecified    History reviewed.  No pertinent surgical history. Family History  Problem Relation Age of Onset  . Hypertension Mother   . Hypothyroidism Mother   . Heart failure Mother   . Stroke Mother   . Bone cancer Father   . Heart disease Maternal Grandfather 54  . Hypertension Maternal Grandfather   . Glaucoma Maternal Grandmother   . Mental illness Paternal Grandmother   . Diabetes Paternal Grandfather   . Angina Paternal Grandfather   . Heart disease Brother   . Melanoma Paternal Uncle 101  . Mental illness Paternal Aunt   . Autoimmune disease Maternal Uncle   . Prostate cancer Maternal Uncle   . Kidney Stones Maternal Uncle   . Stroke Maternal Uncle   . Parkinson's disease Maternal Aunt   . Hypertension Maternal Aunt   . Glaucoma Maternal Aunt    Social History   Socioeconomic History  . Marital status: Married    Spouse name: Not on file  . Number of children: Not on file  . Years of education: Not on file  . Highest education level: Not on file  Occupational History  . Occupation: Ecologist  . Occupation: TEACHER  Social Needs  . Financial resource strain: Not on file  . Food insecurity    Worry: Not on file    Inability: Not on file  . Transportation needs    Medical: Not on file    Non-medical: Not on file  Tobacco Use  . Smoking status: Never Smoker  . Smokeless tobacco: Never Used  Substance and Sexual Activity  . Alcohol use: Not Currently  . Drug use: No  . Sexual activity: Never  Lifestyle  . Physical activity    Days per week: Not on file    Minutes per session: Not on file  . Stress: Not on file  Relationships  . Social Herbalist on phone: Not on file    Gets together: Not on file    Attends religious service: Not on file    Active member of club or organization: Not on file    Attends meetings of clubs or organizations: Not on file    Relationship status: Not on file  Other Topics Concern  . Not on file  Social History Narrative   Prefers  Integrative Medicine, but okay with a DO. Previously saw Dr. Sharol Roussel. Pescatarian for the most part. Intermittent fasting with a 6 hour eating window. Hx of feeling sick after flu shot in the past, so declines flu shot. Hep C in 2013. Hx of breast biopsy. Hx of hypokalemia, previously on KLC 10 mEQ daily. Patient wants to lose 20 pounds in order to get BP at goal rather than try medication. FamHx of HTN. Previous exercise included HIIT at the gym. During COVID, has started baking more and not exercising as much. 03/03/2019  Clinical Intake: Pre-visit preparation completed: Yes Pain : No/denies pain Nutritional Status: BMI 25 -29 Overweight Diabetes: No How often do you need to have someone help you when you read instructions, pamphlets, or other written materials from your doctor or pharmacy?: 1 - Never  Interpreter Needed?: No  Activities of Daily Living In your present state of health, do you have any difficulty performing the following activities: 02/12/2019  Hearing? N  Vision? N  Difficulty concentrating or making decisions? N  Walking or climbing stairs? N  Dressing or bathing? N  Doing errands, shopping? N  Preparing Food and eating ? N  Using the Toilet? N  In the past six months, have you accidently leaked urine? N  Do you have problems with loss of bowel control? N  Managing your Medications? N  Managing your Finances? N  Housekeeping or managing your Housekeeping? N  Some recent data might be hidden   Immunizations and Health Maintenance  There is no immunization history on file for this patient.   Health Maintenance Due  Topic Date Due  . HIV Screening  06/22/1968  . TETANUS/TDAP  06/22/1972  . COLONOSCOPY  06/23/2003  . MAMMOGRAM  03/26/2014  . PAP SMEAR-Modifier  03/07/2015  . DEXA SCAN  06/22/2018  . PNA vac Low Risk Adult (1 of 2 - PCV13) 06/22/2018   Patient Care Team: Briscoe Deutscher, DO as PCP - General (Family Medicine) End, Harrell Gave, MD as Consulting  Physician (Cardiology) Leta Baptist, MD as Consulting Physician (Otolaryngology) Sherlyn Hay, DO as Consulting Physician (Obstetrics and Gynecology) Briscoe Deutscher, DO (Family Medicine)  Assessment:   This is a routine wellness examination for Melinda [name].  Hearing/Vision screen  Hearing Screening   Method: Otoacoustic emissions   125Hz  250Hz  500Hz  1000Hz  2000Hz  3000Hz  4000Hz  6000Hz  8000Hz   Right ear:           Left ear:           Comments: Passed both ears   Visual Acuity Screening   Right eye Left eye Both eyes  Without correction:     With correction: 20/30 20/30 20/30    Dietary issues and exercise activities discussed: Current Exercise Habits: The patient does not participate in regular exercise at present, Exercise limited by: None identified  Goals    . Eat more fruits and vegetables    . Increase physical activity      Depression Screen PHQ 2/9 Scores 01/21/2016 12/30/2015 12/02/2015 11/18/2015 11/04/2015 10/27/2015 10/24/2015  PHQ - 2 Score 0 0 0 0 0 0 0    Fall Risk Fall Risk  01/21/2016 12/30/2015 12/02/2015 11/18/2015 11/04/2015  Falls in the past year? Yes No No No Yes  Number falls in past yr: 1 - - - -  Injury with Fall? Yes - - - -  Comment fracture Left toes - Great toe and 2nd toe - - - -   Timed Get Up and Go Performed: yes, < 12 sec  Cognitive Function: MMSE - Mini Mental State Exam 02/12/2019  Orientation to time 5  Orientation to Place 5  Registration 3  Attention/ Calculation 5  Recall 3  Language- name 2 objects 2  Language- repeat 1  Language- follow 3 step command 3  Language- read & follow direction 1  Write a sentence 1  Copy design 1  Total score 30   Screening Tests Health Maintenance  Topic Date Due  . HIV Screening  06/22/1968  . TETANUS/TDAP  06/22/1972  . COLONOSCOPY  06/23/2003  .  MAMMOGRAM  03/26/2014  . PAP SMEAR-Modifier  03/07/2015  . DEXA SCAN  06/22/2018  . PNA vac Low Risk Adult (1 of 2 - PCV13) 06/22/2018  . INFLUENZA  VACCINE  03/09/2019  . Hepatitis C Screening  Completed     Plan:   Amaris was seen today for new patient (initial visit).  Diagnoses and all orders for this visit:  Welcome to Medicare preventive visit  Elevated blood pressure reading -     CBC with Differential/Platelet  Screening for lipid disorders -     Lipid panel  Hypokalemia -     Comprehensive metabolic panel -     potassium chloride (K-DUR) 10 MEQ tablet; Take 1 tablet (10 mEq total) by mouth 2 (two) times daily.   I have personally reviewed and noted the following in the patient's chart:   . Medical and social history . Use of alcohol, tobacco or illicit drugs  . Current medications and supplements . Functional ability and status . Nutritional status . Physical activity . Advanced directives . List of other physicians . Hospitalizations, surgeries, and ER visits in previous 12 months . Vitals . Screenings to include cognitive, depression, and falls . Referrals and appointments  In addition, I have reviewed and discussed with patient certain preventive protocols, quality metrics, and best practice recommendations. A written personalized care plan for preventive services as well as general preventive health recommendations were provided to patient.     Briscoe Deutscher, DO

## 2019-03-03 ENCOUNTER — Encounter: Payer: Self-pay | Admitting: Family Medicine

## 2019-09-22 ENCOUNTER — Ambulatory Visit: Payer: Medicare HMO | Attending: Internal Medicine

## 2019-09-22 DIAGNOSIS — Z23 Encounter for immunization: Secondary | ICD-10-CM

## 2019-09-22 NOTE — Progress Notes (Signed)
   Covid-19 Vaccination Clinic  Name:  Melinda [name]    MRN: [id number] DOB: 01/31/1951  09/22/2019  Melinda [name] was observed post Covid-19 immunization for 15 minutes without incidence. She was provided with Vaccine Information Sheet and instruction to access the V-Safe system.   Melinda [name] was instructed to call 911 with any severe reactions post vaccine: Marland Kitchen Difficulty breathing  . Swelling of your face and throat  . A fast heartbeat  . A bad rash all over your body  . Dizziness and weakness    Immunizations Administered    Name Date Dose VIS Date Route   Pfizer COVID-19 Vaccine 09/22/2019  2:53 PM 0.3 mL 07/19/2019 Intramuscular   Manufacturer: [place]   Lot: [id number]   [place]: [id number]

## 2019-10-15 ENCOUNTER — Ambulatory Visit: Payer: Medicare HMO | Attending: Internal Medicine

## 2019-10-15 DIAGNOSIS — Z23 Encounter for immunization: Secondary | ICD-10-CM

## 2019-10-15 NOTE — Progress Notes (Signed)
   Covid-19 Vaccination Clinic  Name:  Melinda [name]    MRN: [id number] DOB: 06/05/1951  10/15/2019  [name] was observed post Covid-19 immunization for 15 minutes without incident. She was provided with Vaccine Information Sheet and instruction to access the V-Safe system.   [name] was instructed to call 911 with any severe reactions post vaccine: Marland Kitchen Difficulty breathing  . Swelling of face and throat  . A fast heartbeat  . A bad rash all over body  . Dizziness and weakness   Immunizations Administered    Name Date Dose VIS Date Route   Pfizer COVID-19 Vaccine 10/15/2019  3:40 PM 0.3 mL 07/19/2019 Intramuscular   Manufacturer: [place]   Lot: [id number]   [place]: [id number]

## 2019-10-16 ENCOUNTER — Ambulatory Visit: Payer: Medicare HMO

## 2020-05-25 ENCOUNTER — Other Ambulatory Visit: Payer: Self-pay | Admitting: Family Medicine

## 2020-05-25 DIAGNOSIS — E876 Hypokalemia: Secondary | ICD-10-CM

## 2020-05-27 ENCOUNTER — Telehealth: Payer: Self-pay

## 2020-05-27 DIAGNOSIS — E876 Hypokalemia: Secondary | ICD-10-CM

## 2020-05-27 NOTE — Telephone Encounter (Signed)
MEDICATION: potassium chloride 10 MEQ  PHARMACY: [facility]  Comments:  TOC appt scheduled 07/13/2020  **Let patient know to contact pharmacy at the end of the day to make sure medication is ready. **  ** Please notify patient to allow 48-72 hours to process**  **Encourage patient to contact the pharmacy for refills or they can request refills through [facility]**

## 2020-05-28 MED ORDER — POTASSIUM CHLORIDE ER 10 MEQ PO TBCR: 10.0000 meq | EXTENDED_RELEASE_TABLET | Freq: Two times a day (BID) | ORAL | 2 refills | Status: DC

## 2020-05-28 NOTE — Telephone Encounter (Signed)
Ok to fill 

## 2020-05-28 NOTE — Telephone Encounter (Signed)
Ok to fill enough to get her to her appt with me. Thanks.

## 2020-05-28 NOTE — Telephone Encounter (Signed)
Rx sent in

## 2020-06-22 DIAGNOSIS — H5213 Myopia, bilateral: Secondary | ICD-10-CM | POA: Diagnosis not present

## 2020-06-22 DIAGNOSIS — Z0100 Encounter for examination of eyes and vision without abnormal findings: Secondary | ICD-10-CM | POA: Diagnosis not present

## 2020-07-13 ENCOUNTER — Ambulatory Visit (INDEPENDENT_AMBULATORY_CARE_PROVIDER_SITE_OTHER): Payer: Medicare HMO | Admitting: Physician Assistant

## 2020-07-13 ENCOUNTER — Encounter: Payer: Self-pay | Admitting: Physician Assistant

## 2020-07-13 ENCOUNTER — Other Ambulatory Visit: Payer: Self-pay

## 2020-07-13 VITALS — BP 146/73 | HR 64 | Temp 98.2°F | Ht 66.5 in | Wt 162.8 lb

## 2020-07-13 DIAGNOSIS — R030 Elevated blood-pressure reading, without diagnosis of hypertension: Secondary | ICD-10-CM | POA: Diagnosis not present

## 2020-07-13 DIAGNOSIS — Z13220 Encounter for screening for lipoid disorders: Secondary | ICD-10-CM

## 2020-07-13 DIAGNOSIS — E876 Hypokalemia: Secondary | ICD-10-CM

## 2020-07-13 NOTE — Progress Notes (Signed)
Melinda [name] is a 69 y.o. female here for a new problem.  History of Present Illness:   Chief Complaint  Patient presents with  . Transitions Of Care    HPI  Elevated blood pressure Currently taking no medications. At home blood pressure readings are: not routinely checked. Patient denies chest pain, SOB, blurred vision, dizziness, unusual headaches, lower leg swelling. Patient is not on any medications. Denies excessive caffeine intake, stimulant usage, excessive alcohol intake, or increase in salt consumption.  BP Readings from Last 3 Encounters:  07/13/20 (!) 146/73  02/12/19 (!) 166/88  01/11/18 130/90    Hypokalemia Has been on oral potassium for quite some time. Denies: recent palpitations, leg cramps.   Past Medical History:  Diagnosis Date  . Arthritis   . Back problem   . Cataracts, bilateral   . Chicken pox   . Disorder of adrenal gland ([place])   . Disorders of sulfur-bearing amino-acid metabolism, unspecified ([place])   . Displaced fracture of base of unspecified metacarpal bone, initial encounter for closed fracture   . Gallstones   . Hyperlipidemia   . Hypertension   . Insomnia   . Iron deficiency anemia, unspecified    mostly vegetarian  . Measles   . Mumps   . Sciatica   . Sensitivity to medication   . Sprain of unspecified ligament of right ankle, subsequent encounter   . Vitamin D deficiency, unspecified      Social History   Tobacco Use  . Smoking status: Never Smoker  . Smokeless tobacco: Never Used  Substance Use Topics  . Alcohol use: Yes    Comment: very small amount occasionally  . Drug use: No    History reviewed. No pertinent surgical history.  Family History  Problem Relation Age of Onset  . Hypertension Mother   . Hypothyroidism Mother   . Heart failure Mother   . Stroke Mother   . Bone cancer Father   . Heart disease Maternal Grandfather 46  . Hypertension Maternal Grandfather   . Glaucoma Maternal Grandmother   . Mental  illness Paternal Grandmother   . Diabetes Paternal Grandfather   . Angina Paternal Grandfather   . Heart disease Brother   . Hypertension Brother   . Melanoma Paternal Uncle 101  . Mental illness Paternal Aunt   . Autoimmune disease Maternal Uncle   . Prostate cancer Maternal Uncle   . Kidney Stones Maternal Uncle   . Stroke Maternal Uncle   . Parkinson's disease Maternal Aunt   . Hypertension Maternal Aunt   . Glaucoma Maternal Aunt   . Parkinson's disease Brother     No Known Allergies  Current Medications:   Current Outpatient Medications:  .  Ascorbic Acid (VITAMIN C PO), Take by mouth. Vitamin C and Bio-Quercetin Phytosome, Disp: , Rfl:  .  Cholecalciferol 1.25 MG (50000 UT) TABS, Take by mouth once a week., Disp: , Rfl:  .  Cyanocobalamin (VITAMIN B-12 PO), Take by mouth., Disp: , Rfl:  .  GLYCINE PO, Take 500 mg by mouth. Take 4 tabs at bedtime on an empty stomach or open and melt . May repeat in the middle of the night if needed, Disp: , Rfl:  .  Iodine Strong, Lugols, (IODINE STRONG PO), Take 400 mcg by mouth., Disp: , Rfl:  .  IRON PO, Take by mouth., Disp: , Rfl:  .  magnesium citrate SOLN, Take 1 Bottle by mouth once., Disp: , Rfl:  .  Multiple Vitamins-Minerals (MULTIVITAMIN  WOMEN PO), Take by mouth. Vitamin Code 50 and wiser, Disp: , Rfl:  .  Multiple Vitamins-Minerals (ZINC PO), Take by mouth., Disp: , Rfl:  .  NON FORMULARY, P 5 P 50 mg  1 tablet twice a day, Disp: , Rfl:  .  Nutritional Supplements (DETOX ENZYME FORMULA PO), Take by mouth. Reported on 01/21/2016, Disp: , Rfl:  .  OVER THE COUNTER MEDICATION, daily., Disp: , Rfl:  .  OVER THE COUNTER MEDICATION, Optimum E Complex, Disp: , Rfl:  .  OVER THE COUNTER MEDICATION, MacuGuard Ocular Support, Disp: , Rfl:  .  OVER THE COUNTER MEDICATION, LV-GB Complex, Disp: , Rfl:  .  OVER THE COUNTER MEDICATION, Hair Skin and Nails, Disp: , Rfl:  .  potassium chloride (KLOR-CON) 10 MEQ tablet, Take 1 tablet (10 mEq  total) by mouth 2 (two) times daily. (Patient taking differently: Take 10 mEq by mouth daily. ), Disp: 60 tablet, Rfl: 2 .  VITAMIN K PO, Take by mouth. Koncentrated K, Disp: , Rfl:    Review of Systems:   ROS  Negative unless otherwise specified per HPI.  Vitals:   Vitals:   07/13/20 1111  BP: (!) 146/73  Pulse: 64  Temp: 98.2 F (36.8 C)  TempSrc: Temporal  SpO2: 99%  Weight: 162 lb 12.8 oz (73.8 kg)  Height: 5' 6.5" (1.689 m)     Body mass index is 25.88 kg/m.  Physical Exam:   Physical Exam Vitals and nursing note reviewed.  Constitutional:      General: She is not in acute distress.    Appearance: She is well-developed. She is not ill-appearing or toxic-appearing.  Cardiovascular:     Rate and Rhythm: Normal rate and regular rhythm.     Pulses: Normal pulses.     Heart sounds: Normal heart sounds, S1 normal and S2 normal.     Comments: No LE edema Pulmonary:     Effort: Pulmonary effort is normal.     Breath sounds: Normal breath sounds.  Skin:    General: Skin is warm and dry.  Neurological:     Mental Status: She is alert.     GCS: GCS eye subscore is 4. GCS verbal subscore is 5. GCS motor subscore is 6.  Psychiatric:        Speech: Speech normal.        Behavior: Behavior normal. Behavior is cooperative.    Assessment and Plan:   Melinda [name] was seen today for transitions of care.  Diagnoses and all orders for this visit:  Elevated blood pressure reading Denies any symptoms. Continue current plan to watch blood pressure closely at home. If numbers consistently >140/90 patient was told to follow-up. -     CBC with Differential/Platelet; Future -     Comprehensive metabolic panel; Future  Screening for lipid disorders -     Lipid panel; Future  Hypokalemia Update blood work. She has been without medication for 1 week so she would like to take this consistently and then return for labs. Provide recommendations based on lab results. -      Comprehensive metabolic panel; Future  Melinda Coke, PA-C

## 2020-07-13 NOTE — Patient Instructions (Signed)
It was great to see you!  Please make an appointment with the lab on your way out. I would like for you to return for lab work within 2-4 weeks. After midnight on the day of the lab draw, please do not eat anything. You may have water, black coffee, unsweetened tea.  Take care,  Inda Coke PA-C

## 2020-08-06 DIAGNOSIS — H40011 Open angle with borderline findings, low risk, right eye: Secondary | ICD-10-CM | POA: Diagnosis not present

## 2020-08-14 ENCOUNTER — Encounter (INDEPENDENT_AMBULATORY_CARE_PROVIDER_SITE_OTHER): Payer: BLUE CROSS/BLUE SHIELD | Admitting: Ophthalmology

## 2020-08-27 ENCOUNTER — Other Ambulatory Visit: Payer: Self-pay

## 2020-08-27 ENCOUNTER — Encounter (INDEPENDENT_AMBULATORY_CARE_PROVIDER_SITE_OTHER): Payer: Medicare HMO | Admitting: Ophthalmology

## 2020-08-27 DIAGNOSIS — H43813 Vitreous degeneration, bilateral: Secondary | ICD-10-CM

## 2020-08-27 DIAGNOSIS — H5213 Myopia, bilateral: Secondary | ICD-10-CM

## 2020-08-27 DIAGNOSIS — H2513 Age-related nuclear cataract, bilateral: Secondary | ICD-10-CM

## 2020-08-27 DIAGNOSIS — H33021 Retinal detachment with multiple breaks, right eye: Secondary | ICD-10-CM

## 2020-09-10 ENCOUNTER — Other Ambulatory Visit: Payer: Self-pay

## 2020-09-10 ENCOUNTER — Encounter (INDEPENDENT_AMBULATORY_CARE_PROVIDER_SITE_OTHER): Payer: Medicare HMO | Admitting: Ophthalmology

## 2020-09-10 DIAGNOSIS — H33301 Unspecified retinal break, right eye: Secondary | ICD-10-CM

## 2020-12-18 ENCOUNTER — Other Ambulatory Visit: Payer: Self-pay

## 2020-12-18 ENCOUNTER — Encounter: Payer: Self-pay | Admitting: Family Medicine

## 2020-12-18 ENCOUNTER — Ambulatory Visit (INDEPENDENT_AMBULATORY_CARE_PROVIDER_SITE_OTHER): Payer: Medicare HMO | Admitting: Family Medicine

## 2020-12-18 VITALS — BP 174/91 | HR 68 | Temp 96.5°F | Ht 66.5 in | Wt 167.6 lb

## 2020-12-18 DIAGNOSIS — H6120 Impacted cerumen, unspecified ear: Secondary | ICD-10-CM

## 2020-12-18 NOTE — Patient Instructions (Signed)
It was very nice to see you today!  We irrigated your ears today.  You can try using Debrox as needed to prevent wax buildup.  Take care, Dr Jerline Pain  PLEASE NOTE:  If you had any lab tests please let us know if you have not heard back within a few days. You may see your results on mychart before we have a chance to review them but we will give you a call once they are reviewed by [place]. If we ordered any referrals today, please let us know if you have not heard from their office within the next week.   Please try these tips to maintain a healthy lifestyle:   Eat at least 3 REAL meals and 1-2 snacks per day.  Aim for no more than 5 hours between eating.  If you eat breakfast, please do so within one hour of getting up.    Each meal should contain half fruits/vegetables, one quarter protein, and one quarter carbs (no bigger than a computer mouse)   Cut down on sweet beverages. This includes juice, soda, and sweet tea.     Drink at least 1 glass of water with each meal and aim for at least 8 glasses per day   Exercise at least 150 minutes every week.

## 2020-12-18 NOTE — Progress Notes (Signed)
   Melinda [name] is a 70 y.o. female who presents today for an office visit.  Assessment/Plan:  Cerumen impaction Informed consent given.  Successfully irrigated by RMA today.  Patient tolerated well.    Subjective:  HPI:  Patient here with decreased hearing.  She is concerned about cerumen impaction.       Objective:  Physical Exam: Ht 5' 6.5" (1.689 m)   LMP 08/04/2009   BMI 25.88 kg/m   Gen: No acute distress, resting comfortably HEENT: Bilateral EAC with cerumen. Neuro: Grossly normal, moves all extremities Psych: Normal affect and thought content      Romain Erion M. Jerline Pain, MD 12/18/2020 2:54 PM

## 2021-01-14 ENCOUNTER — Encounter (INDEPENDENT_AMBULATORY_CARE_PROVIDER_SITE_OTHER): Payer: Medicare HMO | Admitting: Ophthalmology

## 2021-01-14 ENCOUNTER — Other Ambulatory Visit: Payer: Self-pay

## 2021-01-14 DIAGNOSIS — H43813 Vitreous degeneration, bilateral: Secondary | ICD-10-CM

## 2021-01-14 DIAGNOSIS — H2513 Age-related nuclear cataract, bilateral: Secondary | ICD-10-CM

## 2021-01-14 DIAGNOSIS — H33301 Unspecified retinal break, right eye: Secondary | ICD-10-CM | POA: Diagnosis not present

## 2021-02-02 ENCOUNTER — Telehealth: Payer: Self-pay | Admitting: Physician Assistant

## 2021-02-02 NOTE — Telephone Encounter (Signed)
Copied from [place] [id number]. Topic: Medicare AWV >> Feb 02, 2021 11:04 AM Harris-Coley, Hannah Beat wrote: Reason for CRM: Left message for patient to schedule Annual Wellness Visit.  Please schedule with Nurse Health Advisor Charlott Rakes, RN at [facility].

## 2021-02-04 ENCOUNTER — Emergency Department (HOSPITAL_BASED_OUTPATIENT_CLINIC_OR_DEPARTMENT_OTHER): Payer: Medicare HMO

## 2021-02-04 ENCOUNTER — Encounter: Payer: Self-pay | Admitting: Family

## 2021-02-04 ENCOUNTER — Emergency Department (HOSPITAL_BASED_OUTPATIENT_CLINIC_OR_DEPARTMENT_OTHER)
Admission: EM | Admit: 2021-02-04 | Discharge: 2021-02-04 | Disposition: A | Discharge status: Home / Self Care | Payer: Medicare HMO | Attending: Emergency Medicine | Admitting: Emergency Medicine

## 2021-02-04 ENCOUNTER — Other Ambulatory Visit: Payer: Self-pay

## 2021-02-04 ENCOUNTER — Encounter (HOSPITAL_BASED_OUTPATIENT_CLINIC_OR_DEPARTMENT_OTHER): Payer: Self-pay | Admitting: Emergency Medicine

## 2021-02-04 ENCOUNTER — Ambulatory Visit (INDEPENDENT_AMBULATORY_CARE_PROVIDER_SITE_OTHER): Payer: Medicare HMO | Admitting: Family

## 2021-02-04 VITALS — BP 116/85 | HR 83 | Temp 98.4°F | Ht 66.5 in | Wt 164.2 lb

## 2021-02-04 DIAGNOSIS — I10 Essential (primary) hypertension: Secondary | ICD-10-CM

## 2021-02-04 DIAGNOSIS — I4891 Unspecified atrial fibrillation: Secondary | ICD-10-CM

## 2021-02-04 DIAGNOSIS — R002 Palpitations: Secondary | ICD-10-CM

## 2021-02-04 DIAGNOSIS — R0602 Shortness of breath: Secondary | ICD-10-CM | POA: Diagnosis not present

## 2021-02-04 DIAGNOSIS — I483 Typical atrial flutter: Secondary | ICD-10-CM | POA: Diagnosis not present

## 2021-02-04 DIAGNOSIS — I4892 Unspecified atrial flutter: Secondary | ICD-10-CM | POA: Diagnosis not present

## 2021-02-04 LAB — CBC
HCT: 40 % (ref 36.0–46.0)
Hemoglobin: 13.1 g/dL (ref 12.0–15.0)
MCH: 29.2 pg (ref 26.0–34.0)
MCHC: 32.8 g/dL (ref 30.0–36.0)
MCV: 89.3 fL (ref 80.0–100.0)
Platelets: 156 10*3/uL (ref 150–400)
RBC: 4.48 MIL/uL (ref 3.87–5.11)
RDW: 12.3 % (ref 11.5–15.5)
WBC: 5 10*3/uL (ref 4.0–10.5)
nRBC: 0 % (ref 0.0–0.2)

## 2021-02-04 LAB — BASIC METABOLIC PANEL
Anion gap: 8 (ref 5–15)
BUN: 13 mg/dL (ref 8–23)
CO2: 27 mmol/L (ref 22–32)
Calcium: 9.9 mg/dL (ref 8.9–10.3)
Chloride: 107 mmol/L (ref 98–111)
Creatinine, Ser: 0.77 mg/dL (ref 0.44–1.00)
GFR, Estimated: >60 mL/min (ref >60)
Glucose, Bld: 107 mg/dL — ABNORMAL HIGH (ref 70–99)
Potassium: 3.7 mmol/L (ref 3.5–5.1)
Sodium: 142 mmol/L (ref 135–145)

## 2021-02-04 LAB — TROPONIN I (HIGH SENSITIVITY)
Troponin I (High Sensitivity): 6 ng/L (ref <18)
Troponin I (High Sensitivity): 8 ng/L (ref <18)

## 2021-02-04 MED ORDER — SODIUM CHLORIDE 0.9 % IV BOLUS
500.0000 mL | Freq: Once | INTRAVENOUS | Status: AC
  Administered 2021-02-04: 500 mL via INTRAVENOUS

## 2021-02-04 MED ORDER — RIVAROXABAN 20 MG PO TABS: 20.0000 mg | ORAL_TABLET | Freq: Every day | ORAL | 0 refills | Status: DC

## 2021-02-04 MED ORDER — DILTIAZEM HCL ER COATED BEADS 120 MG PO CP24: 120.0000 mg | ORAL_CAPSULE | Freq: Every day | ORAL | 0 refills | Status: DC

## 2021-02-04 MED ORDER — DILTIAZEM HCL ER COATED BEADS 120 MG PO CP24
120.0000 mg | ORAL_CAPSULE | Freq: Once | ORAL | Status: AC
  Administered 2021-02-04: 120 mg via ORAL
  Filled 2021-02-04: qty 1

## 2021-02-04 MED ORDER — DILTIAZEM HCL 25 MG/5ML IV SOLN
20.0000 mg | Freq: Once | INTRAVENOUS | Status: AC
  Administered 2021-02-04: 20 mg via INTRAVENOUS
  Filled 2021-02-04 (×2): qty 5

## 2021-02-04 NOTE — Progress Notes (Signed)
Acute Office Visit  Subjective:    Patient ID: Melinda [name], female    DOB: 1951/07/26, 70 y.o.   MRN: [id number]  Chief Complaint  Patient presents with   Palpitations    Started today around lunch time. Pt states that she took CBD oil to calm down. She denies chest pain. She says that she has been doing intermittent fasting, so she thinks this is why she is mildly lightheaded.     HPI Patient is in today with complaints of palpitations that have occurred more frequently over the last 10 days.  Reports having 3 episodes of palpitations that typically happen at night.  However, today she noticed that they were occurring during the day and became very concerned.  She has used CBD oil to try to rid the palpitations.  She has a history of SVT in the past.  No chest pain or shortness of breath.  She has a family history significant for thyroid disorder.  Reports being more stressed at home with her husband with metastatic cancer.  She has not seen cardiology since 2018 when she had a similar episode that was unable to be captured by monitor.  Past Medical History:  Diagnosis Date   Arthritis    Back problem    Cataracts, bilateral    Chicken pox    Disorder of adrenal gland ([place])    Disorders of sulfur-bearing amino-acid metabolism, unspecified ([place])    Displaced fracture of base of unspecified metacarpal bone, initial encounter for closed fracture    Gallstones    Hyperlipidemia    Hypertension    Insomnia    Iron deficiency anemia, unspecified    mostly vegetarian   Measles    Mumps    Sciatica    Sensitivity to medication    Sprain of unspecified ligament of right ankle, subsequent encounter    Vitamin D deficiency, unspecified     History reviewed. No pertinent surgical history.  Family History  Problem Relation Age of Onset   Hypertension Mother    Hypothyroidism Mother    Heart failure Mother    Stroke Mother    Bone cancer Father    Heart disease Maternal  Grandfather 30   Hypertension Maternal Grandfather    Glaucoma Maternal Grandmother    Mental illness Paternal Grandmother    Diabetes Paternal Grandfather    Angina Paternal Grandfather    Heart disease Brother    Hypertension Brother    Melanoma Paternal Uncle 35   Mental illness Paternal Aunt    Autoimmune disease Maternal Uncle    Prostate cancer Maternal Uncle    Kidney Stones Maternal Uncle    Stroke Maternal Uncle    Parkinson's disease Maternal Aunt    Hypertension Maternal Aunt    Glaucoma Maternal Aunt    Parkinson's disease Brother     Social History   Socioeconomic History   Marital status: Married    Spouse name: Not on file   Number of children: Not on file   Years of education: Not on file   Highest education level: Not on file  Occupational History   Occupation: FREELANCE VIOLINIST   Occupation: TEACHER  Tobacco Use   Smoking status: Never   Smokeless tobacco: Never  Substance and Sexual Activity   Alcohol use: Yes    Comment: very small amount occasionally   Drug use: No   Sexual activity: Never  Other Topics Concern   Not on file  Social History Narrative  Freelance violinist   Lives with husband   2 children -- remain local   No grandchildren      Prefers Integrative Medicine, but okay with a DO. Previously saw Dr. Sharol Roussel. Pescatarian for the most part. Intermittent fasting with a 6 hour eating window. Hx of feeling sick after flu shot in the past, so declines flu shot. Hep C in 2013. Hx of breast biopsy. Hx of hypokalemia, previously on KLC 10 mEQ daily. Patient wants to lose 20 pounds in order to get BP at goal rather than try medication. FamHx of HTN. Previous exercise included HIIT at the gym. During COVID, has started baking more and not exercising as much. 03/03/2019    Social Determinants of Health   Financial Resource Strain: Not on file  Food Insecurity: Not on file  Transportation Needs: Not on file  Physical Activity: Not on file   Stress: Not on file  Social Connections: Not on file  Intimate Partner Violence: Not on file    Outpatient Medications Prior to Visit  Medication Sig Dispense Refill   Ascorbic Acid (VITAMIN C PO) Take by mouth. Vitamin C and Bio-Quercetin Phytosome     Cholecalciferol 1.25 MG (50000 UT) TABS Take by mouth once a week.     Cyanocobalamin (VITAMIN B-12 PO) Take by mouth.     GLYCINE PO Take 500 mg by mouth. Take 4 tabs at bedtime on an empty stomach or open and melt . May repeat in the middle of the night if needed     Iodine Strong, Lugols, (IODINE STRONG PO) Take 400 mcg by mouth.     IRON PO Take by mouth.     magnesium citrate SOLN Take 1 Bottle by mouth once.     Multiple Vitamins-Minerals (MULTIVITAMIN WOMEN PO) Take by mouth. Vitamin Code 50 and wiser     Multiple Vitamins-Minerals (ZINC PO) Take by mouth.     NON FORMULARY P 5 P 50 mg  1 tablet twice a day     Nutritional Supplements (DETOX ENZYME FORMULA PO) Take by mouth. Reported on 01/21/2016     OVER THE COUNTER MEDICATION daily.     OVER THE COUNTER MEDICATION Optimum E Complex     OVER THE COUNTER MEDICATION MacuGuard Ocular Support     OVER THE COUNTER MEDICATION LV-GB Complex     OVER THE COUNTER MEDICATION Hair Skin and Nails     potassium chloride (KLOR-CON) 10 MEQ tablet Take 1 tablet (10 mEq total) by mouth 2 (two) times daily. (Patient taking differently: Take 10 mEq by mouth daily.) 60 tablet 2   UNABLE TO FIND CBD oil 1032m     VITAMIN K PO Take by mouth. Koncentrated K     No facility-administered medications prior to visit.    No Known Allergies  Review of Systems  Respiratory: Negative.    Cardiovascular:  Positive for palpitations. Negative for chest pain and leg swelling.  Gastrointestinal: Negative.   Endocrine: Negative.   Genitourinary: Negative.   Musculoskeletal: Negative.   Skin: Negative.   Allergic/Immunologic: Negative.   Neurological: Negative.   Hematological: Negative.    Psychiatric/Behavioral:  The patient is nervous/anxious.   All other systems reviewed and are negative.     Objective:    Physical Exam Vitals and nursing note reviewed.  Constitutional:      Appearance: Normal appearance.  HENT:     Right Ear: Tympanic membrane and ear canal normal.     Left Ear: Tympanic membrane and  ear canal normal.  Eyes:     Extraocular Movements: Extraocular movements intact.     Pupils: Pupils are equal, round, and reactive to light.  Cardiovascular:     Rate and Rhythm: Tachycardia present. Rhythm irregular.     Pulses: Normal pulses.     Heart sounds: Normal heart sounds.     Comments: Heart rate 132 Pulmonary:     Effort: Pulmonary effort is normal.     Breath sounds: Normal breath sounds.  Abdominal:     General: Abdomen is flat.     Palpations: Abdomen is soft.  Musculoskeletal:        General: Normal range of motion.     Cervical back: Normal range of motion and neck supple.  Skin:    General: Skin is warm and dry.  Neurological:     General: No focal deficit present.     Mental Status: She is alert and oriented to person, place, and time.  Psychiatric:        Mood and Affect: Mood normal.        Behavior: Behavior normal.    BP 116/85   Pulse 83   Temp 98.4 F (36.9 C) (Temporal)   Ht 5' 6.5" (1.689 m)   Wt 164 lb 3.2 oz (74.5 kg)   LMP 08/04/2009   SpO2 98%   BMI 26.11 kg/m  Wt Readings from Last 3 Encounters:  02/04/21 164 lb 3.2 oz (74.5 kg)  12/18/20 167 lb 9.6 oz (76 kg)  07/13/20 162 lb 12.8 oz (73.8 kg)    Health Maintenance Due  Topic Date Due   Zoster Vaccines- Shingrix (1 of 2) Never done   MAMMOGRAM  03/26/2014   DEXA SCAN  Never done   COVID-19 Vaccine (4 - Booster for Pfizer series) 10/10/2020    There are no preventive care reminders to display for this patient.   Lab Results  Component Value Date   TSH 1.920 11/02/2017   Lab Results  Component Value Date   WBC 4.4 02/12/2019   HGB 12.9  02/12/2019   HCT 38.9 02/12/2019   MCV 88.8 02/12/2019   PLT 163.0 02/12/2019   Lab Results  Component Value Date   NA 141 02/12/2019   K 3.9 02/12/2019   CO2 29 02/12/2019   GLUCOSE 93 02/12/2019   BUN 10 02/12/2019   CREATININE 0.78 02/12/2019   BILITOT 0.5 02/12/2019   ALKPHOS 78 02/12/2019   AST 32 02/12/2019   ALT 37 (H) 02/12/2019   PROT 7.2 02/12/2019   ALBUMIN 4.8 02/12/2019   CALCIUM 9.7 02/12/2019   GFR 73.97 02/12/2019   Lab Results  Component Value Date   CHOL 243 (H) 02/12/2019   Lab Results  Component Value Date   HDL 64.10 02/12/2019   Lab Results  Component Value Date   LDLCALC 146 (H) 02/12/2019   Lab Results  Component Value Date   TRIG 165.0 (H) 02/12/2019   Lab Results  Component Value Date   CHOLHDL 4 02/12/2019   No results found for: HGBA1C     Assessment & Plan:   Problem List Items Addressed This Visit     Palpitations - Primary   Relevant Orders   EKG 12-Lead (Completed)   Comp Met (CMET)   Thyroid Panel With TSH   CBC w/Diff   Other Visit Diagnoses     Atrial fibrillation, unspecified type [facility])            After consulting with Dr.  Yong Channel, will send patient to the emergency department for further evaluation.  No labs obtained.  We will follow-up post ER discharge.  Report called to drop [facility] emergency department.  Patient will go via private vehicle.   Kennyth Arnold, FNP

## 2021-02-04 NOTE — ED Triage Notes (Signed)
Pt sent from UC related to Afib a Hr of 136. Pt stated that palpitations started at approximately 1300 today. Pt denis Pain. Pt denis N/V and headache.

## 2021-02-04 NOTE — Discharge Instructions (Addendum)
There was a small irregularity in the right lower lung. You need to have another chest x-ray or CT to make sure it is not a lung nodule.

## 2021-02-04 NOTE — ED Provider Notes (Signed)
[facility] EMERGENCY DEPT Provider Note   CSN: [id number] Arrival date & time: 02/04/21  1705     History Chief Complaint  Patient presents with   Palpitations    Melinda [name] is a 70 y.o. female.  SHAKEELA RABADAN has a history of SVT.  She has had palpitations intermittently ever since and has even worn a continuous heart monitor for 30 days.  This was unremarkable.  10 days ago, she began to have more frequent palpitations.  Today she started having a fast heart rate a little after 1 PM.  It has persisted.  She has seen cardiology in the past, but her cardiologist has moved.  She no longer has a cardiologist.  She does see a naturopathic physician and takes multiple vitamin and other supplements including a thyroid supplement.  The history is provided by the patient.  Palpitations Palpitations quality:  Fast Onset quality:  Sudden Duration:  5 hours Timing:  Constant Progression:  Unchanged Chronicity:  Recurrent Context: not anxiety, not exercise, not illicit drugs and not stimulant use   Relieved by:  Nothing Worsened by:  Nothing Ineffective treatments:  None tried Associated symptoms: no back pain, no chest pain, no cough, no dizziness, no nausea, no shortness of breath and no vomiting       Past Medical History:  Diagnosis Date   Arthritis    Back problem    Cataracts, bilateral    Chicken pox    Disorder of adrenal gland (HCC)    Disorders of sulfur-bearing amino-acid metabolism, unspecified (HCC)    Displaced fracture of base of unspecified metacarpal bone, initial encounter for closed fracture    Gallstones    Hyperlipidemia    Hypertension    Insomnia    Iron deficiency anemia, unspecified    mostly vegetarian   Measles    Mumps    Sciatica    Sensitivity to medication    Sprain of unspecified ligament of right ankle, subsequent encounter    Vitamin D deficiency, unspecified     Patient Active Problem List   Diagnosis Date Noted    NSVT (nonsustained ventricular tachycardia) ([place]) 01/11/2018   Palpitations 11/02/2017   Elevated blood pressure reading 11/02/2017    History reviewed. No pertinent surgical history.   OB History     Gravida  2   Para  2   Term      Preterm      AB      Living         SAB      IAB      Ectopic      Multiple      Live Births              Family History  Problem Relation Age of Onset   Hypertension Mother    Hypothyroidism Mother    Heart failure Mother    Stroke Mother    Bone cancer Father    Heart disease Maternal Grandfather 71   Hypertension Maternal Grandfather    Glaucoma Maternal Grandmother    Mental illness Paternal Grandmother    Diabetes Paternal Grandfather    Angina Paternal Grandfather    Heart disease Brother    Hypertension Brother    Melanoma Paternal Uncle 57   Mental illness Paternal Aunt    Autoimmune disease Maternal Uncle    Prostate cancer Maternal Uncle    Kidney Stones Maternal Uncle    Stroke Maternal Uncle  Parkinson's disease Maternal Aunt    Hypertension Maternal Aunt    Glaucoma Maternal Aunt    Parkinson's disease Brother     Social History   Tobacco Use   Smoking status: Never   Smokeless tobacco: Never  Substance Use Topics   Alcohol use: Yes    Comment: very small amount occasionally   Drug use: No    Home Medications Prior to Admission medications   Medication Sig Start Date End Date Taking? Authorizing Provider  Cyanocobalamin (VITAMIN B-12 PO) Take by mouth.   Yes [provider]  Ascorbic Acid (VITAMIN C PO) Take by mouth. Vitamin C and Bio-Quercetin Phytosome    [provider]  Cholecalciferol 1.25 MG (50000 UT) TABS Take by mouth once a week.    [provider]  GLYCINE PO Take 500 mg by mouth. Take 4 tabs at bedtime on an empty stomach or open and melt . May repeat in the middle of the night if needed    [provider]  Iodine Strong, Lugols, (IODINE  STRONG PO) Take 400 mcg by mouth.    [provider]  IRON PO Take by mouth.    [provider]  magnesium citrate SOLN Take 1 Bottle by mouth once.    [provider]  Multiple Vitamins-Minerals (MULTIVITAMIN WOMEN PO) Take by mouth. Vitamin Code 50 and wiser    [provider]  Multiple Vitamins-Minerals (ZINC PO) Take by mouth.    [provider]  NON FORMULARY P 5 P 50 mg  1 tablet twice a day    [provider]  Nutritional Supplements (DETOX ENZYME FORMULA PO) Take by mouth. Reported on 01/21/2016    [provider]  OVER THE COUNTER MEDICATION daily.    [provider]  OVER THE COUNTER MEDICATION Optimum E Complex    [provider]  OVER THE COUNTER MEDICATION MacuGuard Ocular Support    [provider]  OVER THE COUNTER MEDICATION LV-GB Complex    [provider]  OVER THE COUNTER MEDICATION Hair Skin and Nails    [provider]  potassium chloride (KLOR-CON) 10 MEQ tablet Take 1 tablet (10 mEq total) by mouth 2 (two) times daily. Patient taking differently: Take 10 mEq by mouth daily. 05/28/20   Inda Coke, PA  UNABLE TO FIND CBD oil 1000mg     [provider]  VITAMIN K PO Take by mouth. Koncentrated K    [provider]    Allergies    Patient has no known allergies.  Review of Systems   Review of Systems  Constitutional:  Negative for chills and fever.  HENT:  Negative for ear pain and sore throat.   Eyes:  Negative for pain and visual disturbance.  Respiratory:  Negative for cough and shortness of breath.   Cardiovascular:  Positive for palpitations. Negative for chest pain.  Gastrointestinal:  Negative for abdominal pain, nausea and vomiting.  Genitourinary:  Negative for dysuria and hematuria.  Musculoskeletal:  Negative for arthralgias and back pain.  Skin:  Negative for color change and rash.  Neurological:  Negative for dizziness,  seizures and syncope.  All other systems reviewed and are negative.  Physical Exam Updated Vital Signs BP (!) 160/124 (BP Location: Right Arm)   Pulse (!) 140   Temp 98.4 F (36.9 C) (Oral)   Resp 16   Ht 5\' 7"  (1.702 m)   Wt 74.4 kg   LMP 08/04/2009   SpO2 97%  BMI 25.69 kg/m   Physical Exam Vitals and nursing note reviewed.  Constitutional:      Appearance: She is well-developed.  HENT:     Head: Normocephalic and atraumatic.  Cardiovascular:     Rate and Rhythm: Regular rhythm. Tachycardia present.     Pulses: Normal pulses.     Heart sounds: Normal heart sounds.  Pulmonary:     Effort: Pulmonary effort is normal. No tachypnea.     Breath sounds: Normal breath sounds.  Abdominal:     Palpations: Abdomen is soft.     Tenderness: There is no abdominal tenderness.  Musculoskeletal:     Right lower leg: No edema.     Left lower leg: No edema.  Skin:    General: Skin is warm and dry.  Neurological:     General: No focal deficit present.     Mental Status: She is alert and oriented to person, place, and time.  Psychiatric:        Mood and Affect: Mood normal.        Behavior: Behavior normal.    ED Results / Procedures / Treatments   Labs (all labs ordered are listed, but only abnormal results are displayed) Labs Reviewed  BASIC METABOLIC PANEL - Abnormal; Notable for the following components:      Result Value   Glucose, Bld 107 (*)    All other components within normal limits  CBC  TSH  TROPONIN I (HIGH SENSITIVITY)  TROPONIN I (HIGH SENSITIVITY)    EKG EKG Interpretation  Date/Time:  Thursday February 04 2021 18:09:36 EDT Ventricular Rate:  83 PR Interval:    QRS Duration: 82 QT Interval:  366 QTC Calculation: 430 R Axis:   47 Text Interpretation: Atrial fibrillation Abnormal R-wave progression, early transition Minimal ST depression, inferior leads Rate has decreased since prior EKG performed earlier today Confirmed by Lorre Munroe ([id number]) on  02/04/2021 6:14:47 PM  Radiology DG Chest Port 1 View  Result Date: 02/04/2021 CLINICAL DATA:  Shortness of breath EXAM: PORTABLE CHEST 1 VIEW COMPARISON:  None. FINDINGS: No consolidation or effusion. Mild bronchitic changes. Small irregular opacity in the right lower lung. Normal heart size. No pneumothorax. IMPRESSION: 1. Mild bronchitic changes. 2. Small irregular opacity in the right lower lung is indeterminate for summation artifact versus lung nodule. Consider correlation with chest CT Electronically Signed   By: Donavan Foil M.D.   On: 02/04/2021 18:43    Procedures Procedures   Medications Ordered in ED Medications  diltiazem (CARDIZEM) injection 20 mg (20 mg Intravenous Given 02/04/21 1806)  sodium chloride 0.9 % bolus 500 mL (0 mLs Intravenous Stopped 02/04/21 1957)  diltiazem (CARDIZEM CD) 24 hr capsule 120 mg (120 mg Oral Given 02/04/21 1828)    ED Course  I have reviewed the triage vital signs and the nursing notes.  Pertinent labs & imaging results that were available during my care of the patient were reviewed by me and considered in my medical decision making (see chart for details).    MDM Rules/Calculators/A&P                         CHADS-VASC2: 3  DAIANA VITIELLO presented with palpitations and was found to have atrial flutter.  This has been happening intermittently for quite some time but fairly regularly for the past 10 days.  She was given diltiazem with excellent rate control.  She will be discharged home.  She was placed on  Xarelto and diltiazem and given instructions to follow-up immediately with the atrial fibrillation clinic.  At the time of discharge, her TSH was pending, and she was advised to follow this result. Final Clinical Impression(s) / ED Diagnoses Final diagnoses:  Typical atrial flutter ([place])  Hypertension, unspecified type    Rx / DC Orders ED Discharge Orders          Ordered    rivaroxaban (XARELTO) 20 MG TABS tablet  Daily with supper         02/04/21 2121    diltiazem (CARDIZEM CD) 120 MG 24 hr capsule  Daily        02/04/21 2121             Arnaldo Natal, MD 02/04/21 2138

## 2021-02-05 LAB — TSH: TSH: 1.193 u[IU]/mL (ref 0.350–4.500)

## 2021-02-09 ENCOUNTER — Other Ambulatory Visit: Payer: Self-pay

## 2021-02-09 ENCOUNTER — Ambulatory Visit (HOSPITAL_COMMUNITY)
Admission: RE | Admit: 2021-02-09 | Discharge: 2021-02-09 | Disposition: A | Discharge status: Home / Self Care | Payer: Medicare HMO | Source: Ambulatory Visit | Attending: Physician Assistant | Admitting: Physician Assistant

## 2021-02-09 ENCOUNTER — Encounter: Payer: Self-pay | Admitting: Family Medicine

## 2021-02-09 VITALS — BP 170/88 | HR 60 | Ht 67.0 in | Wt 165.4 lb

## 2021-02-09 DIAGNOSIS — I10 Essential (primary) hypertension: Secondary | ICD-10-CM | POA: Insufficient documentation

## 2021-02-09 DIAGNOSIS — Z79899 Other long term (current) drug therapy: Secondary | ICD-10-CM | POA: Diagnosis not present

## 2021-02-09 DIAGNOSIS — R0683 Snoring: Secondary | ICD-10-CM | POA: Diagnosis not present

## 2021-02-09 DIAGNOSIS — Z8249 Family history of ischemic heart disease and other diseases of the circulatory system: Secondary | ICD-10-CM | POA: Insufficient documentation

## 2021-02-09 DIAGNOSIS — D6869 Other thrombophilia: Secondary | ICD-10-CM | POA: Insufficient documentation

## 2021-02-09 DIAGNOSIS — Z7901 Long term (current) use of anticoagulants: Secondary | ICD-10-CM | POA: Diagnosis not present

## 2021-02-09 DIAGNOSIS — I480 Paroxysmal atrial fibrillation: Secondary | ICD-10-CM | POA: Insufficient documentation

## 2021-02-09 DIAGNOSIS — I4892 Unspecified atrial flutter: Secondary | ICD-10-CM | POA: Diagnosis not present

## 2021-02-09 DIAGNOSIS — E785 Hyperlipidemia, unspecified: Secondary | ICD-10-CM | POA: Diagnosis not present

## 2021-02-09 MED ORDER — RIVAROXABAN 20 MG PO TABS: 20.0000 mg | ORAL_TABLET | Freq: Every day | ORAL | 3 refills | Status: DC

## 2021-02-09 MED ORDER — DILTIAZEM HCL ER COATED BEADS 120 MG PO CP24: 120.0000 mg | ORAL_CAPSULE | Freq: Every day | ORAL | 3 refills | Status: DC

## 2021-02-09 NOTE — Progress Notes (Signed)
Primary Care Physician: Inda Coke, PA Primary Cardiologist: Dr End (remotely) Primary Electrophysiologist: none Referring Physician: [facility]   Melinda [name] is a 70 y.o. female with a history of HLD, HTN, atrial fibrillation who presents for consultation in the [facility].  The patient was initially diagnosed with atrial fibrillation and atrial flutter on 02/04/21 when she presented to her PCP with palpitations. ECG showed rapid atrial flutter and she was sent to the ED. She was started on rate control and then her ECG showed afib. Patient was started on Xarelto for a CHADS2VASC score of 3. She since converted back to SR and has not had any further symptoms. She drinks very little alcohol but does admit to snoring.   Today, she denies symptoms of palpitations, chest pain, shortness of breath, orthopnea, PND, lower extremity edema, dizziness, presyncope, syncope, snoring, daytime somnolence, bleeding, or neurologic sequela. The patient is tolerating medications without difficulties and is otherwise without complaint today.    Atrial Fibrillation Risk Factors:  she does have symptoms or diagnosis of sleep apnea. she does not have a history of rheumatic fever. she does not have a history of alcohol use. The patient does not have a history of early familial atrial fibrillation or other arrhythmias.  she has a BMI of Body mass index is 25.91 kg/m.Marland Kitchen Filed Weights   02/09/21 1132  Weight: 75 kg    Family History  Problem Relation Age of Onset   Hypertension Mother    Hypothyroidism Mother    Heart failure Mother    Stroke Mother    Bone cancer Father    Heart disease Maternal Grandfather 81   Hypertension Maternal Grandfather    Glaucoma Maternal Grandmother    Mental illness Paternal Grandmother    Diabetes Paternal Grandfather    Angina Paternal Grandfather    Heart disease Brother    Hypertension Brother    Melanoma Paternal Uncle  56   Mental illness Paternal Aunt    Autoimmune disease Maternal Uncle    Prostate cancer Maternal Uncle    Kidney Stones Maternal Uncle    Stroke Maternal Uncle    Parkinson's disease Maternal Aunt    Hypertension Maternal Aunt    Glaucoma Maternal Aunt    Parkinson's disease Brother      Atrial Fibrillation Management history:  Previous antiarrhythmic drugs: none Previous cardioversions: none Previous ablations: none CHADS2VASC score: 3 Anticoagulation history: Xarelto   Past Medical History:  Diagnosis Date   Arthritis    Back problem    Cataracts, bilateral    Chicken pox    Disorder of adrenal gland (HCC)    Disorders of sulfur-bearing amino-acid metabolism, unspecified (HCC)    Displaced fracture of base of unspecified metacarpal bone, initial encounter for closed fracture    Gallstones    Hyperlipidemia    Hypertension    Insomnia    Iron deficiency anemia, unspecified    mostly vegetarian   Measles    Mumps    Sciatica    Sensitivity to medication    Sprain of unspecified ligament of right ankle, subsequent encounter    Vitamin D deficiency, unspecified    No past surgical history on file.  Current Outpatient Medications  Medication Sig Dispense Refill   Ascorbic Acid (VITAMIN C PO) Take 1 tablet by mouth daily in the afternoon. Vitamin C and Bio-Quercetin Phytosome     Cholecalciferol 1.25 MG (50000 UT) TABS Take 1 tablet by mouth once  a week.     Cyanocobalamin (VITAMIN B-12 PO) Take 1 tablet by mouth daily in the afternoon.     Digestive Enzymes (DIGESTIVE ENZYME PO) Take 1 tablet by mouth daily in the afternoon. Primary Digest- Brand     IRON PO Take 1 tablet by mouth daily in the afternoon.     magnesium gluconate (MAGONATE) 500 MG tablet Take 500 mg by mouth daily in the afternoon.     MAGNESIUM GLYCINATE PO Take 1 tablet by mouth daily.     NON FORMULARY P 5 P 50 mg  -Taking 1 tablet by mouth once a day     OVER THE COUNTER MEDICATION Take 1  capsule by mouth daily in the afternoon. Unique E-     OVER THE COUNTER MEDICATION Take 1 tablet by mouth daily in the afternoon. MacuGuard Ocular Support     OVER THE COUNTER MEDICATION Take 1 tablet by mouth daily in the afternoon. LV-GB Complex     OVER THE COUNTER MEDICATION Take 1 tablet by mouth daily. Hair Skin and Nails     potassium chloride (KLOR-CON) 10 MEQ tablet Take 1 tablet (10 mEq total) by mouth 2 (two) times daily. (Patient taking differently: Take 10 mEq by mouth daily.) 60 tablet 2   UNABLE TO FIND CBD oil 1000mg      VITAMIN K PO Take 1 tablet by mouth daily. Potassium     diltiazem (CARDIZEM CD) 120 MG 24 hr capsule Take 1 capsule (120 mg total) by mouth daily. 30 capsule 3   GLYCINE PO Take 500 mg by mouth. Take 4 tabs at bedtime on an empty stomach or open and melt . May repeat in the middle of the night if needed     Iodine Strong, Lugols, (IODINE STRONG PO) Take 400 mcg by mouth. (Patient not taking: Reported on 02/09/2021)     Multiple Vitamins-Minerals (MULTIVITAMIN WOMEN PO) Take 1 tablet by mouth daily in the afternoon. Vitamin Code 50 and wiser     Multiple Vitamins-Minerals (ZINC PO) Take by mouth.     Nutritional Supplements (DETOX ENZYME FORMULA PO) Take by mouth. Reported on 01/21/2016 (Patient not taking: Reported on 02/09/2021)     OVER THE COUNTER MEDICATION Take 4 tablets by mouth daily.     rivaroxaban (XARELTO) 20 MG TABS tablet Take 1 tablet (20 mg total) by mouth daily with supper. 30 tablet 3   No current facility-administered medications for this encounter.    No Known Allergies  Social History   Socioeconomic History   Marital status: Married    Spouse name: Not on file   Number of children: Not on file   Years of education: Not on file   Highest education level: Not on file  Occupational History   Occupation: FREELANCE VIOLINIST   Occupation: TEACHER  Tobacco Use   Smoking status: Never   Smokeless tobacco: Never  Substance and Sexual  Activity   Alcohol use: Yes    Comment: very small amount occasionally   Drug use: No   Sexual activity: Never  Other Topics Concern   Not on file  Social History Narrative   Freelance violinist   Lives with husband   2 children -- remain local   No grandchildren      Prefers Integrative Medicine, but okay with a DO. Previously saw Dr. Sharol Roussel. Pescatarian for the most part. Intermittent fasting with a 6 hour eating window. Hx of feeling sick after flu shot in the past, so declines  flu shot. Hep C in 2013. Hx of breast biopsy. Hx of hypokalemia, previously on KLC 10 mEQ daily. Patient wants to lose 20 pounds in order to get BP at goal rather than try medication. FamHx of HTN. Previous exercise included HIIT at the gym. During COVID, has started baking more and not exercising as much. 03/03/2019    Social Determinants of Health   Financial Resource Strain: Not on file  Food Insecurity: Not on file  Transportation Needs: Not on file  Physical Activity: Not on file  Stress: Not on file  Social Connections: Not on file  Intimate Partner Violence: Not on file     ROS- All systems are reviewed and negative except as per the HPI above.  Physical Exam: Vitals:   02/09/21 1132  BP: (!) 170/88  Pulse: 60  Weight: 75 kg  Height: 5\' 7"  (1.702 m)    GEN- The patient is a well appearing female, alert and oriented x 3 today.   Head- normocephalic, atraumatic Eyes-  Sclera clear, conjunctiva pink Ears- hearing intact Oropharynx- clear Neck- supple  Lungs- Clear to ausculation bilaterally, normal work of breathing Heart- Regular rate and rhythm, no murmurs, rubs or gallops  GI- soft, NT, ND, + BS Extremities- no clubbing, cyanosis, or edema MS- no significant deformity or atrophy Skin- no rash or lesion Psych- euthymic mood, full affect Neuro- strength and sensation are intact  Wt Readings from Last 3 Encounters:  02/09/21 75 kg  02/04/21 74.4 kg  02/04/21 74.5 kg    EKG today  demonstrates  SR Vent. rate 60 BPM PR interval 138 ms QRS duration 80 ms QT/QTcB 414/414 ms  Epic records are reviewed at length today  CHA2DS2-VASc Score = 3  The patient's score is based upon: CHF History: No HTN History: Yes Diabetes History: No Stroke History: No Vascular Disease History: No Age Score: 1 Gender Score: 1     ASSESSMENT AND PLAN: 1. Paroxsymal Atrial Fibrillation/atrial flutter The patient's CHA2DS2-VASc score is 3, indicating a 3.2% annual risk of stroke.   General education about afib provided and questions answered. We also discussed her stroke risk and the risks and benefits of anticoagulation. Check echocardiogram Continue Xarelto 20 mg daily Continue diltiazem 120 mg daily  2. Secondary Hypercoagulable State (ICD10:  D68.69) The patient is at significant risk for stroke/thromboembolism based upon her CHA2DS2-VASc Score of 3.  Continue Rivaroxaban (Xarelto).   3. HTN Stable, no changes today.  4. Snoring/daytime somnolence  The importance of adequate treatment of sleep apnea was discussed today in order to improve our ability to maintain sinus rhythm long term. Patient deferred sleep study for now.   Follow up in the AF clinic in 4-6 weeks.    [facility] [address] [place], [place] [address] [phone] 02/09/2021 1:28 PM

## 2021-02-24 ENCOUNTER — Telehealth: Payer: Self-pay

## 2021-02-24 NOTE — Telephone Encounter (Signed)
Patient called in stating that she went to the hospital while she was out of town. She stated that she had another flutter in her chest. She saw Padonda on 6/30, went to the hospital and she had an EKG. She is scheduled for an Echo to see why she is having issues. She would like to speak to someone, she said that she just has a few questions. Can someone call her at [phone]

## 2021-02-25 NOTE — Telephone Encounter (Signed)
Spoke to pt, she said she had her 2nd COVID booster about 4 weeks ago. Pt has been having palpitations off and on and is scheduled for an Echo next week. Pt said her daughter had some palpitations after her vaccine. Pt wants to know if there is any correlation with the vaccine and palpitations, any studies or information about it? Told pt I will send message to Dr. Jerline Pain but he is out of the office till Monday. Pt said that is fine.

## 2021-02-25 NOTE — Telephone Encounter (Signed)
Dr. Jerline Pain, please see message from pt and advise.

## 2021-02-28 NOTE — Telephone Encounter (Signed)
I am not aware of any association between the booster and palpitations.  Algis Greenhouse. Jerline Pain, MD 02/28/2021 6:52 PM

## 2021-02-28 NOTE — Telephone Encounter (Signed)
Ok with me. Please place any necessary orders. 

## 2021-03-01 ENCOUNTER — Telehealth: Payer: Self-pay

## 2021-03-01 ENCOUNTER — Other Ambulatory Visit: Payer: Self-pay | Admitting: Physician Assistant

## 2021-03-01 DIAGNOSIS — E876 Hypokalemia: Secondary | ICD-10-CM

## 2021-03-01 NOTE — Telephone Encounter (Signed)
Patient is calling in stating that she received a cologuard test in the mail earlier this year and the results were negative. Received a phone call today telling her she is overdue for a colonoscopy -wanting to know if she has to still proceed with that or is she okay to wait.

## 2021-03-01 NOTE — Telephone Encounter (Signed)
Left message on voicemail to call office.  

## 2021-03-02 ENCOUNTER — Ambulatory Visit (HOSPITAL_COMMUNITY)
Admission: RE | Admit: 2021-03-02 | Discharge: 2021-03-02 | Disposition: A | Discharge status: Home / Self Care | Payer: Medicare HMO | Source: Ambulatory Visit | Attending: Physician Assistant | Admitting: Physician Assistant

## 2021-03-02 ENCOUNTER — Other Ambulatory Visit: Payer: Self-pay

## 2021-03-02 ENCOUNTER — Telehealth: Payer: Self-pay

## 2021-03-02 DIAGNOSIS — E785 Hyperlipidemia, unspecified: Secondary | ICD-10-CM | POA: Diagnosis not present

## 2021-03-02 DIAGNOSIS — I10 Essential (primary) hypertension: Secondary | ICD-10-CM | POA: Insufficient documentation

## 2021-03-02 DIAGNOSIS — I340 Nonrheumatic mitral (valve) insufficiency: Secondary | ICD-10-CM | POA: Insufficient documentation

## 2021-03-02 DIAGNOSIS — I480 Paroxysmal atrial fibrillation: Secondary | ICD-10-CM | POA: Diagnosis not present

## 2021-03-02 LAB — ECHOCARDIOGRAM COMPLETE
Area-P 1/2: 3.31 cm2
S' Lateral: 2.7 cm

## 2021-03-02 NOTE — Progress Notes (Signed)
  Echocardiogram 2D Echocardiogram has been performed.  Franklin Baumbach G Marlee Trentman 03/02/2021, 11:55 AM

## 2021-03-02 NOTE — Telephone Encounter (Signed)
Spoke to pt told her we do not have any record of Cologuard being ordered and I could not find anything on the portal where we send our patients. Pt verbalized understanding and said she received it in the mail from [place] and did it. Told her we need a copy of the result and then we can update your chart. Told her if she had one done then she is good for 3 years and the reason it is showing colonoscopy due is because we have not results. Pt verbalized understanding and said she will try to find the results and get copy to [place]. Told her okay.

## 2021-03-02 NOTE — Telephone Encounter (Signed)
Left message on voicemail to call office.  

## 2021-03-02 NOTE — Telephone Encounter (Signed)
..   LAST APPOINTMENT DATE:  07/13/20  NEXT APPOINTMENT DATE:'@Visit'$  date not found  MEDICATION:  Klor Con  Is the patient out of medication?   PHARMACY: [facility]  Let patient know to contact pharmacy at the end of the day to make sure medication is ready.  Please notify patient to allow 48-72 hours to process  Encourage patient to contact the pharmacy for refills or they can request refills through [facility]:   LAST REFILL:  QTY:  REFILL DATE:    OTHER COMMENTS:    Okay for refill?  Please advise

## 2021-03-03 NOTE — Telephone Encounter (Signed)
Rx was sent yesterday.

## 2021-03-04 ENCOUNTER — Other Ambulatory Visit: Payer: Self-pay

## 2021-03-04 ENCOUNTER — Encounter (HOSPITAL_COMMUNITY): Payer: Self-pay | Admitting: Physician Assistant

## 2021-03-04 ENCOUNTER — Ambulatory Visit (HOSPITAL_COMMUNITY)
Admission: RE | Admit: 2021-03-04 | Discharge: 2021-03-04 | Disposition: A | Discharge status: Home / Self Care | Payer: Medicare HMO | Source: Ambulatory Visit | Attending: Physician Assistant | Admitting: Physician Assistant

## 2021-03-04 VITALS — BP 126/82 | HR 61 | Ht 67.0 in | Wt 162.8 lb

## 2021-03-04 DIAGNOSIS — I10 Essential (primary) hypertension: Secondary | ICD-10-CM | POA: Diagnosis not present

## 2021-03-04 DIAGNOSIS — Z8249 Family history of ischemic heart disease and other diseases of the circulatory system: Secondary | ICD-10-CM | POA: Diagnosis not present

## 2021-03-04 DIAGNOSIS — I480 Paroxysmal atrial fibrillation: Secondary | ICD-10-CM | POA: Diagnosis not present

## 2021-03-04 DIAGNOSIS — E785 Hyperlipidemia, unspecified: Secondary | ICD-10-CM | POA: Insufficient documentation

## 2021-03-04 DIAGNOSIS — Z79899 Other long term (current) drug therapy: Secondary | ICD-10-CM | POA: Insufficient documentation

## 2021-03-04 DIAGNOSIS — D6869 Other thrombophilia: Secondary | ICD-10-CM | POA: Insufficient documentation

## 2021-03-04 DIAGNOSIS — Z7901 Long term (current) use of anticoagulants: Secondary | ICD-10-CM | POA: Diagnosis not present

## 2021-03-04 DIAGNOSIS — I4892 Unspecified atrial flutter: Secondary | ICD-10-CM | POA: Insufficient documentation

## 2021-03-04 NOTE — Progress Notes (Signed)
Primary Care Physician: Inda Coke, PA Primary Cardiologist: Dr End (remotely) Primary Electrophysiologist: none Referring Physician: [facility]   Melinda [name] is a 70 y.o. female with a history of HLD, HTN, atrial fibrillation who presents for follow up in the [facility].  The patient was initially diagnosed with atrial fibrillation and atrial flutter on 02/04/21 when she presented to her PCP with palpitations. ECG showed rapid atrial flutter and she was sent to the ED. She was started on rate control and then her ECG showed afib. Patient was started on Xarelto for a CHADS2VASC score of 3. She since converted back to SR. She drinks very little alcohol but does admit to snoring.   On follow up today, patient reports that she did have another episode of palpitations which lasted about 5 hours. She admits she did not sleep well the night prior. She denies any bleeding issues on anticoagulation.   Today, she denies symptoms of chest pain, shortness of breath, orthopnea, PND, lower extremity edema, dizziness, presyncope, syncope, snoring, daytime somnolence, bleeding, or neurologic sequela. The patient is tolerating medications without difficulties and is otherwise without complaint today.    Atrial Fibrillation Risk Factors:  she does have symptoms or diagnosis of sleep apnea. she does not have a history of rheumatic fever. she does not have a history of alcohol use. The patient does not have a history of early familial atrial fibrillation or other arrhythmias.  she has a BMI of Body mass index is 25.5 kg/m.Marland Kitchen Filed Weights   03/04/21 1039  Weight: 73.8 kg     Family History  Problem Relation Age of Onset   Hypertension Mother    Hypothyroidism Mother    Heart failure Mother    Stroke Mother    Bone cancer Father    Heart disease Maternal Grandfather 51   Hypertension Maternal Grandfather    Glaucoma Maternal Grandmother    Mental  illness Paternal Grandmother    Diabetes Paternal Grandfather    Angina Paternal Grandfather    Heart disease Brother    Hypertension Brother    Melanoma Paternal Uncle 37   Mental illness Paternal Aunt    Autoimmune disease Maternal Uncle    Prostate cancer Maternal Uncle    Kidney Stones Maternal Uncle    Stroke Maternal Uncle    Parkinson's disease Maternal Aunt    Hypertension Maternal Aunt    Glaucoma Maternal Aunt    Parkinson's disease Brother      Atrial Fibrillation Management history:  Previous antiarrhythmic drugs: none Previous cardioversions: none Previous ablations: none CHADS2VASC score: 3 Anticoagulation history: Xarelto   Past Medical History:  Diagnosis Date   Arthritis    Back problem    Cataracts, bilateral    Chicken pox    Disorder of adrenal gland (HCC)    Disorders of sulfur-bearing amino-acid metabolism, unspecified (HCC)    Displaced fracture of base of unspecified metacarpal bone, initial encounter for closed fracture    Gallstones    Hyperlipidemia    Hypertension    Insomnia    Iron deficiency anemia, unspecified    mostly vegetarian   Measles    Mumps    Sciatica    Sensitivity to medication    Sprain of unspecified ligament of right ankle, subsequent encounter    Vitamin D deficiency, unspecified    No past surgical history on file.  Current Outpatient Medications  Medication Sig Dispense Refill   Ascorbic Acid (VITAMIN  C PO) Take 1 tablet by mouth daily in the afternoon. Vitamin C and Bio-Quercetin Phytosome     Cholecalciferol 1.25 MG (50000 UT) TABS Take 1 tablet by mouth once a week.     Cyanocobalamin (VITAMIN B-12 PO) Take 1 tablet by mouth daily in the afternoon.     Digestive Enzymes (DIGESTIVE ENZYME PO) Take 1 tablet by mouth daily in the afternoon. Primary Digest- Brand     diltiazem (CARDIZEM CD) 120 MG 24 hr capsule Take 1 capsule (120 mg total) by mouth daily. 30 capsule 3   GLYCINE PO Take 500 mg by mouth. Take 4  tabs at bedtime on an empty stomach or open and melt . May repeat in the middle of the night if needed     Iodine Strong, Lugols, (IODINE STRONG PO) Take 400 mcg by mouth.     IRON PO Take 1 tablet by mouth daily in the afternoon.     magnesium gluconate (MAGONATE) 500 MG tablet Take 500 mg by mouth daily in the afternoon.     MAGNESIUM GLYCINATE PO Take 1 tablet by mouth daily.     Multiple Vitamins-Minerals (MULTIVITAMIN WOMEN PO) Take 1 tablet by mouth daily in the afternoon. Vitamin Code 50 and wiser     NON FORMULARY P 5 P 50 mg  -Taking 1 tablet by mouth once a day     Nutritional Supplements (DETOX ENZYME FORMULA PO) Take by mouth. Reported on 01/21/2016     OVER THE COUNTER MEDICATION Take 4 tablets by mouth daily.     OVER THE COUNTER MEDICATION Take 1 capsule by mouth daily in the afternoon. Unique E-     OVER THE COUNTER MEDICATION Take 1 tablet by mouth daily in the afternoon. MacuGuard Ocular Support     OVER THE COUNTER MEDICATION Take 1 tablet by mouth daily in the afternoon. LV-GB Complex     OVER THE COUNTER MEDICATION Take 1 tablet by mouth daily. Hair Skin and Nails     potassium chloride (KLOR-CON) 10 MEQ tablet Take 1 tablet by mouth twice daily 60 tablet 0   rivaroxaban (XARELTO) 20 MG TABS tablet Take 1 tablet (20 mg total) by mouth daily with supper. 30 tablet 3   UNABLE TO FIND CBD oil '1000mg'$      VITAMIN K PO Take 1 tablet by mouth daily. Potassium     No current facility-administered medications for this encounter.    No Known Allergies  Social History   Socioeconomic History   Marital status: Married    Spouse name: Not on file   Number of children: Not on file   Years of education: Not on file   Highest education level: Not on file  Occupational History   Occupation: FREELANCE VIOLINIST   Occupation: TEACHER  Tobacco Use   Smoking status: Never   Smokeless tobacco: Never  Substance and Sexual Activity   Alcohol use: Yes    Comment: very small amount  occasionally   Drug use: No   Sexual activity: Never  Other Topics Concern   Not on file  Social History Narrative   Freelance violinist   Lives with husband   2 children -- remain local   No grandchildren      Prefers Integrative Medicine, but okay with a DO. Previously saw Dr. Sharol Roussel. Pescatarian for the most part. Intermittent fasting with a 6 hour eating window. Hx of feeling sick after flu shot in the past, so declines flu shot. Hep C in  2013. Hx of breast biopsy. Hx of hypokalemia, previously on KLC 10 mEQ daily. Patient wants to lose 20 pounds in order to get BP at goal rather than try medication. FamHx of HTN. Previous exercise included HIIT at the gym. During COVID, has started baking more and not exercising as much. 03/03/2019    Social Determinants of Health   Financial Resource Strain: Not on file  Food Insecurity: Not on file  Transportation Needs: Not on file  Physical Activity: Not on file  Stress: Not on file  Social Connections: Not on file  Intimate Partner Violence: Not on file     ROS- All systems are reviewed and negative except as per the HPI above.  Physical Exam: Vitals:   03/04/21 1039  BP: 126/82  Pulse: 61  Weight: 73.8 kg  Height: '5\' 7"'$  (1.702 m)    GEN- The patient is a well appearing female, alert and oriented x 3 today.   HEENT-head normocephalic, atraumatic, sclera clear, conjunctiva pink, hearing intact, trachea midline. Lungs- Clear to ausculation bilaterally, normal work of breathing Heart- Regular rate and rhythm, no murmurs, rubs or gallops  GI- soft, NT, ND, + BS Extremities- no clubbing, cyanosis, or edema MS- no significant deformity or atrophy Skin- no rash or lesion Psych- euthymic mood, full affect Neuro- strength and sensation are intact   Wt Readings from Last 3 Encounters:  03/04/21 73.8 kg  02/09/21 75 kg  02/04/21 74.4 kg    EKG today demonstrates  SR Vent. rate 61 BPM PR interval 142 ms QRS duration 80  ms QT/QTcB 430/432 ms  Echo 03/02/21  1. Left ventricular ejection fraction by 3D volume is 62 %. The left  ventricle has normal function. The left ventricle has no regional wall  motion abnormalities. Left ventricular diastolic parameters were normal.   2. Right ventricular systolic function is normal. The right ventricular  size is normal. There is normal pulmonary artery systolic pressure.   3. The mitral valve is normal in structure. Mild mitral valve  regurgitation. No evidence of mitral stenosis.   4. The aortic valve is grossly normal. Aortic valve regurgitation is  trivial. No aortic stenosis is present.   Epic records are reviewed at length today  CHA2DS2-VASc Score = 3  The patient's score is based upon: CHF History: No HTN History: Yes Diabetes History: No Stroke History: No Vascular Disease History: No Age Score: 1 Gender Score: 1     ASSESSMENT AND PLAN: 1. Paroxsymal Atrial Fibrillation/atrial flutter The patient's CHA2DS2-VASc score is 3, indicating a 3.2% annual risk of stroke.   We discussed therapeutic options today including AAD, PRN med, and ablation. Patient is currently satisfied with her present therapy and does not want to make any changes. If her afib becomes more persistent/frequent then she would consider rhythm options. Continue Xarelto 20 mg daily Continue diltiazem 120 mg daily  2. Secondary Hypercoagulable State (ICD10:  D68.69) The patient is at significant risk for stroke/thromboembolism based upon her CHA2DS2-VASc Score of 3.  Continue Rivaroxaban (Xarelto).   3. HTN Stable, no changes today.   Follow up in the AF clinic in 6 months.    [facility] [address] [place], [place] [address] [phone] 03/04/2021 10:48 AM

## 2021-03-28 ENCOUNTER — Other Ambulatory Visit: Payer: Self-pay | Admitting: Family Medicine

## 2021-03-28 DIAGNOSIS — E876 Hypokalemia: Secondary | ICD-10-CM

## 2021-04-06 ENCOUNTER — Other Ambulatory Visit: Payer: Self-pay

## 2021-04-06 ENCOUNTER — Encounter: Payer: Self-pay | Admitting: Physician Assistant

## 2021-04-06 ENCOUNTER — Ambulatory Visit (INDEPENDENT_AMBULATORY_CARE_PROVIDER_SITE_OTHER): Payer: Medicare HMO | Admitting: Physician Assistant

## 2021-04-06 VITALS — BP 164/90 | HR 61 | Temp 98.3°F | Ht 67.0 in | Wt 162.0 lb

## 2021-04-06 DIAGNOSIS — R918 Other nonspecific abnormal finding of lung field: Secondary | ICD-10-CM

## 2021-04-06 DIAGNOSIS — R002 Palpitations: Secondary | ICD-10-CM | POA: Diagnosis not present

## 2021-04-06 NOTE — Progress Notes (Signed)
Melinda [name] is a 70 y.o. female is here to discuss:X-ray results  I acted as a Education administrator for [employer], PA-C Anselmo Pickler, LPN   History of Present Illness:   Chief Complaint  Patient presents with   Discuss x-ray results    HPI  Discuss x-ray results Pt is here to discuss x-rays done on 6/30 in the ED. She went to ED from primary care office for atrial flutter and SOB. Was given prescription diltiazem 120 mg daily and rivaroxaban 20 mg daily. Went to afib clinic after ER visit on 02/09/21. She has only had one episode since that time and overall feels well controlled, was told to follow-up with afib clinic in about 6 months, unless having more frequent concerns. Denies: unintentional significant weight changes, night sweats, smoking hx, abdominal pain, unusual cough.  IMPRESSION: 1. Mild bronchitic changes. 2. Small irregular opacity in the right lower lung is indeterminate for summation artifact versus lung nodule. Consider correlation with chest CT.  Wt Readings from Last 4 Encounters:  04/06/21 162 lb (73.5 kg)  03/04/21 162 lb 12.8 oz (73.8 kg)  02/09/21 165 lb 6.4 oz (75 kg)  02/04/21 164 lb (74.4 kg)      Health Maintenance Due  Topic Date Due   MAMMOGRAM  03/26/2014   DEXA SCAN  Never done   COVID-19 Vaccine (4 - Booster for Pfizer series) 10/10/2020    Past Medical History:  Diagnosis Date   Arthritis    Back problem    Cataracts, bilateral    Chicken pox    Disorder of adrenal gland (HCC)    Disorders of sulfur-bearing amino-acid metabolism, unspecified (HCC)    Displaced fracture of base of unspecified metacarpal bone, initial encounter for closed fracture    Gallstones    Hyperlipidemia    Hypertension    Insomnia    Iron deficiency anemia, unspecified    mostly vegetarian   Measles    Mumps    Sciatica    Sensitivity to medication    Sprain of unspecified ligament of right ankle, subsequent encounter    Vitamin D deficiency, unspecified       Social History   Tobacco Use   Smoking status: Never   Smokeless tobacco: Never  Substance Use Topics   Alcohol use: Yes    Comment: very small amount occasionally   Drug use: No    History reviewed. No pertinent surgical history.  Family History  Problem Relation Age of Onset   Hypertension Mother    Hypothyroidism Mother    Heart failure Mother    Stroke Mother    Bone cancer Father    Heart disease Maternal Grandfather 82   Hypertension Maternal Grandfather    Glaucoma Maternal Grandmother    Mental illness Paternal Grandmother    Diabetes Paternal Grandfather    Angina Paternal Grandfather    Heart disease Brother    Hypertension Brother    Melanoma Paternal Uncle 69   Mental illness Paternal Aunt    Autoimmune disease Maternal Uncle    Prostate cancer Maternal Uncle    Kidney Stones Maternal Uncle    Stroke Maternal Uncle    Parkinson's disease Maternal Aunt    Hypertension Maternal Aunt    Glaucoma Maternal Aunt    Parkinson's disease Brother     PMHx, SurgHx, SocialHx, FamHx, Medications, and Allergies were reviewed in the Visit Navigator and updated as appropriate.   Patient Active Problem List   Diagnosis Date Noted  Paroxysmal atrial fibrillation ([place]) 02/09/2021   Secondary hypercoagulable state ([place]) 02/09/2021   NSVT (nonsustained ventricular tachycardia) ([place]) 01/11/2018   Palpitations 11/02/2017   Elevated blood pressure reading 11/02/2017    Social History   Tobacco Use   Smoking status: Never   Smokeless tobacco: Never  Substance Use Topics   Alcohol use: Yes    Comment: very small amount occasionally   Drug use: No    Current Medications and Allergies:    Current Outpatient Medications:    Acetylcysteine (NAC) 600 MG CAPS, Take 2 capsules by mouth daily in the afternoon., Disp: , Rfl:    Ascorbic Acid (VITAMIN C PO), Take 1 tablet by mouth daily in the afternoon. Vitamin C and Bio-Quercetin Phytosome, Disp: , Rfl:     Cholecalciferol 1.25 MG (50000 UT) TABS, Take 1 tablet by mouth once a week., Disp: , Rfl:    Cyanocobalamin (VITAMIN B-12 PO), Take 1 tablet by mouth daily in the afternoon., Disp: , Rfl:    Digestive Enzymes (DIGESTIVE ENZYME PO), Take 1 tablet by mouth daily in the afternoon. Primary Digest- Brand, Disp: , Rfl:    diltiazem (CARDIZEM CD) 120 MG 24 hr capsule, Take 1 capsule (120 mg total) by mouth daily., Disp: 30 capsule, Rfl: 3   GLYCINE PO, Take 500 mg by mouth. Take 4 tabs at bedtime on an empty stomach or open and melt . May repeat in the middle of the night if needed, Disp: , Rfl:    Iodine Strong, Lugols, (IODINE STRONG PO), Take 400 mcg by mouth., Disp: , Rfl:    IRON PO, Take 1 tablet by mouth daily in the afternoon., Disp: , Rfl:    magnesium gluconate (MAGONATE) 500 MG tablet, Take 500 mg by mouth daily in the afternoon., Disp: , Rfl:    MAGNESIUM GLYCINATE PO, Take 1 tablet by mouth daily., Disp: , Rfl:    Multiple Vitamins-Minerals (MULTIVITAMIN WOMEN PO), Take 1 tablet by mouth daily in the afternoon. Vitamin Code 50 and wiser, Disp: , Rfl:    NON FORMULARY, P 5 P 50 mg  -Taking 1 tablet by mouth once a day, Disp: , Rfl:    Nutritional Supplements (DETOX ENZYME FORMULA PO), Take by mouth. Reported on 01/21/2016, Disp: , Rfl:    OVER THE COUNTER MEDICATION, Take 4 tablets by mouth daily., Disp: , Rfl:    OVER THE COUNTER MEDICATION, Take 1 capsule by mouth daily in the afternoon. Unique E-, Disp: , Rfl:    OVER THE COUNTER MEDICATION, Take 1 tablet by mouth daily in the afternoon. MacuGuard Ocular Support, Disp: , Rfl:    OVER THE COUNTER MEDICATION, Take 1 tablet by mouth daily in the afternoon. LV-GB Complex, Disp: , Rfl:    OVER THE COUNTER MEDICATION, Take 1 tablet by mouth daily. Hair Skin and Nails, Disp: , Rfl:    potassium chloride (KLOR-CON) 10 MEQ tablet, Take 1 tablet by mouth twice daily, Disp: 60 tablet, Rfl: 0   rivaroxaban (XARELTO) 20 MG TABS tablet, Take 1 tablet (20  mg total) by mouth daily with supper., Disp: 30 tablet, Rfl: 3   UNABLE TO FIND, CBD oil '1000mg'$ , Disp: , Rfl:    VITAMIN K PO, Take 1 tablet by mouth daily. Potassium, Disp: , Rfl:   No Known Allergies  Review of Systems   ROS Negative unless otherwise specified per HPI.  Vitals:   Vitals:   04/06/21 1109  BP: (!) 164/90  Pulse: 61  Temp: 98.3 F (  36.8 C)  TempSrc: Temporal  SpO2: 99%  Weight: 162 lb (73.5 kg)  Height: '5\' 7"'$  (1.702 m)     Body mass index is 25.37 kg/m.   Physical Exam:    Physical Exam Vitals and nursing note reviewed.  Constitutional:      General: She is not in acute distress.    Appearance: She is well-developed. She is not ill-appearing or toxic-appearing.  Cardiovascular:     Rate and Rhythm: Normal rate and regular rhythm.     Pulses: Normal pulses.     Heart sounds: Normal heart sounds, S1 normal and S2 normal.     Comments: No LE edema Pulmonary:     Effort: Pulmonary effort is normal.     Breath sounds: Normal breath sounds.  Skin:    General: Skin is warm and dry.  Neurological:     Mental Status: She is alert.     GCS: GCS eye subscore is 4. GCS verbal subscore is 5. GCS motor subscore is 6.  Psychiatric:        Speech: Speech normal.        Behavior: Behavior normal. Behavior is cooperative.     Assessment and Plan:    Melinda [name] was seen today for discuss x-ray results.  Diagnoses and all orders for this visit:  Opacity of lung on imaging study Will order CT chest for further evaluation of opacity on xray -     CT Chest W Contrast; Future  Palpitations Overall well controlled per patient Continue xarelto 20 mg daily and diltiazem 120 mg daily  Follow-up with a fib clinic   CMA or LPN served as scribe during this visit. History, Physical, and Plan performed by medical provider. The above documentation has been reviewed and is accurate and complete.   Inda Coke, PA-C [place] [place] 04/06/2021  Follow-up:  No follow-ups on file.

## 2021-04-06 NOTE — Patient Instructions (Signed)
It was great to see you!  We are ordering a CT scan of your chest  Please stay near your phone over the next few says so we can schedule this for you  Take care,  Inda Coke PA-C

## 2021-04-08 ENCOUNTER — Ambulatory Visit (HOSPITAL_BASED_OUTPATIENT_CLINIC_OR_DEPARTMENT_OTHER)
Admission: RE | Admit: 2021-04-08 | Discharge: 2021-04-08 | Disposition: A | Discharge status: Home / Self Care | Payer: Medicare HMO | Source: Ambulatory Visit | Attending: Physician Assistant | Admitting: Physician Assistant

## 2021-04-08 ENCOUNTER — Encounter (HOSPITAL_BASED_OUTPATIENT_CLINIC_OR_DEPARTMENT_OTHER): Payer: Self-pay

## 2021-04-08 ENCOUNTER — Other Ambulatory Visit: Payer: Self-pay

## 2021-04-08 DIAGNOSIS — R918 Other nonspecific abnormal finding of lung field: Secondary | ICD-10-CM

## 2021-04-08 DIAGNOSIS — I700 Atherosclerosis of aorta: Secondary | ICD-10-CM | POA: Diagnosis not present

## 2021-04-08 DIAGNOSIS — I2510 Atherosclerotic heart disease of native coronary artery without angina pectoris: Secondary | ICD-10-CM | POA: Diagnosis not present

## 2021-04-08 LAB — POCT I-STAT CREATININE: Creatinine, Ser: 0.9 mg/dL (ref 0.44–1.00)

## 2021-04-08 MED ORDER — IOHEXOL 350 MG/ML SOLN
50.0000 mL | Freq: Once | INTRAVENOUS | Status: AC | PRN
  Administered 2021-04-08: 50 mL via INTRAVENOUS

## 2021-04-26 DIAGNOSIS — Z01419 Encounter for gynecological examination (general) (routine) without abnormal findings: Secondary | ICD-10-CM | POA: Diagnosis not present

## 2021-04-29 ENCOUNTER — Other Ambulatory Visit: Payer: Self-pay | Admitting: Family Medicine

## 2021-04-29 DIAGNOSIS — E876 Hypokalemia: Secondary | ICD-10-CM

## 2021-06-26 ENCOUNTER — Other Ambulatory Visit (HOSPITAL_COMMUNITY): Payer: Self-pay | Admitting: Physician Assistant

## 2021-07-26 ENCOUNTER — Other Ambulatory Visit: Payer: Self-pay | Admitting: Physician Assistant

## 2021-07-26 DIAGNOSIS — E876 Hypokalemia: Secondary | ICD-10-CM

## 2021-09-15 ENCOUNTER — Telehealth (INDEPENDENT_AMBULATORY_CARE_PROVIDER_SITE_OTHER): Payer: Medicare HMO | Admitting: Family

## 2021-09-15 ENCOUNTER — Encounter: Payer: Self-pay | Admitting: Family

## 2021-09-15 ENCOUNTER — Other Ambulatory Visit: Payer: Self-pay

## 2021-09-15 VITALS — Ht 67.0 in | Wt 162.0 lb

## 2021-09-15 DIAGNOSIS — U071 COVID-19: Secondary | ICD-10-CM

## 2021-09-15 MED ORDER — MOLNUPIRAVIR EUA 200MG CAPSULE: 4.0000 | ORAL_CAPSULE | Freq: Two times a day (BID) | ORAL | 0 refills | Status: AC

## 2021-09-15 NOTE — Progress Notes (Signed)
MyChart Video Visit    Virtual Visit via Video Note   This visit type was conducted due to national recommendations for restrictions regarding the COVID-19 Pandemic (e.g. social distancing) in an effort to limit this patient's exposure and mitigate transmission in our community. This patient is at least at moderate risk for complications without adequate follow up. This format is felt to be most appropriate for this patient at this time. Physical exam was limited by quality of the video and audio technology used for the visit. CMA was able to get the patient set up on a video visit.  Patient location: Home. Patient and provider in visit Provider location: Office  I discussed the limitations of evaluation and management by telemedicine and the availability of in person appointments. The patient expressed understanding and agreed to proceed.  Visit Date: 09/15/2021  Today's healthcare provider: Jeanie Sewer, NP     Subjective:    Patient ID: Melinda [name], female    DOB: June 30, 1951, 71 y.o.   MRN: [id number]  Chief Complaint  Patient presents with   Covid Positive    Tested Positive last night. Symptoms started yesterday.    Fatigue   Nasal Congestion    Taking OTC nasal spray, cough syrup. She denies sore throat. She states having some recent flutters, and does not prefer medications that will increase them.    Headache    HPI Upper Respiratory Infection: Symptoms include achiness, congestion, low grade fever, nasal congestion, non productive cough, post nasal drip, sinus pressure, and sore throat.  Onset of symptoms was 1 day ago, gradually worsening since that time. She is drinking moderate amounts of fluids. Evaluation to date: none.  Treatment to date: antihistamines and nasal saline, generic Coricidin .Pt concerned because husband is immunosuppressed.    Past Medical History:  Diagnosis Date   Arthritis    Back problem    Cataracts, bilateral    Chicken pox     Disorder of adrenal gland ([place])    Disorders of sulfur-bearing amino-acid metabolism, unspecified ([place])    Displaced fracture of base of unspecified metacarpal bone, initial encounter for closed fracture    Gallstones    Hyperlipidemia    Hypertension    Insomnia    Iron deficiency anemia, unspecified    mostly vegetarian   Measles    Mumps    Sciatica    Sensitivity to medication    Sprain of unspecified ligament of right ankle, subsequent encounter    Vitamin D deficiency, unspecified     History reviewed. No pertinent surgical history.  Outpatient Medications Prior to Visit  Medication Sig Dispense Refill   Acetylcysteine (NAC) 600 MG CAPS Take 2 capsules by mouth daily in the afternoon.     Ascorbic Acid (VITAMIN C PO) Take 1 tablet by mouth daily in the afternoon. Vitamin C and Bio-Quercetin Phytosome     Cholecalciferol 1.25 MG (50000 UT) TABS Take 1 tablet by mouth once a week.     Cyanocobalamin (VITAMIN B-12 PO) Take 1 tablet by mouth daily in the afternoon.     Digestive Enzymes (DIGESTIVE ENZYME PO) Take 1 tablet by mouth daily in the afternoon. Primary Digest- Brand     diltiazem (CARDIZEM CD) 120 MG 24 hr capsule Take 1 capsule by mouth once daily 30 capsule 3   GLYCINE PO Take 500 mg by mouth. Take 4 tabs at bedtime on an empty stomach or open and melt . May repeat in the middle of the  night if needed     Iodine Strong, Lugols, (IODINE STRONG PO) Take 400 mcg by mouth.     IRON PO Take 1 tablet by mouth daily in the afternoon.     magnesium gluconate (MAGONATE) 500 MG tablet Take 500 mg by mouth daily in the afternoon.     MAGNESIUM GLYCINATE PO Take 1 tablet by mouth daily.     Multiple Vitamins-Minerals (MULTIVITAMIN WOMEN PO) Take 1 tablet by mouth daily in the afternoon. Vitamin Code 50 and wiser     NON FORMULARY P 5 P 50 mg  -Taking 1 tablet by mouth once a day     Nutritional Supplements (DETOX ENZYME FORMULA PO) Take by mouth. Reported on 01/21/2016      OVER THE COUNTER MEDICATION Take 4 tablets by mouth daily.     OVER THE COUNTER MEDICATION Take 1 capsule by mouth daily in the afternoon. Unique E-     OVER THE COUNTER MEDICATION Take 1 tablet by mouth daily in the afternoon. MacuGuard Ocular Support     OVER THE COUNTER MEDICATION Take 1 tablet by mouth daily in the afternoon. LV-GB Complex     OVER THE COUNTER MEDICATION Take 1 tablet by mouth daily. Hair Skin and Nails     potassium chloride (KLOR-CON) 10 MEQ tablet Take 1 tablet by mouth twice daily 60 tablet 0   UNABLE TO FIND CBD oil 1000mg      VITAMIN K PO Take 1 tablet by mouth daily. Potassium     XARELTO 20 MG TABS tablet TAKE 1 TABLET BY MOUTH ONCE DAILY WITH SUPPER 30 tablet 3   No facility-administered medications prior to visit.    No Known Allergies      Objective:     Physical Exam Vitals and nursing note reviewed.  Constitutional:      General: She is not in acute distress.    Appearance: Normal appearance.  HENT:     Head: Normocephalic.  Pulmonary:     Effort: No respiratory distress.  Musculoskeletal:     Cervical back: Normal range of motion.  Skin:    General: Skin is dry.     Coloration: Skin is not pale.  Neurological:     Mental Status: She is alert and oriented to person, place, and time.  Psychiatric:        Mood and Affect: Mood normal.   Ht 5\' 7"  (1.702 m)    Wt 162 lb 0.6 oz (73.5 kg)    LMP 08/04/2009    BMI 25.38 kg/m   Wt Readings from Last 3 Encounters:  09/15/21 162 lb 0.6 oz (73.5 kg)  04/06/21 162 lb (73.5 kg)  03/04/21 162 lb 12.8 oz (73.8 kg)       Assessment & Plan:   Problem List Items Addressed This Visit       Other   COVID-19 - Primary    pt does not feel that bad, concerned about antiviral side effects. Advised most people tolerate well, she could discuss with pharmacist also before starting. Advised based on her age, heart condition, immunocompromised husband, it is advised she be treated. Continue generic  Coricidin and tylenol prn, drink plenty of water. Sending Molnupiravir, pt advised of FDA label for emergency use, how to take, & SE. Advised of CDC guidelines for self isolation/ ending isolation.  Advised of safe practice guidelines. Symptom Tier reviewed.  Encouraged to monitor for any worsening symptoms; watch for increased shortness of breath, weakness, and signs of  dehydration. Instructed to rest and hydrate well.  Advised to wear a mask if necessary to leave the house.       Relevant Medications   molnupiravir EUA (LAGEVRIO) 200 mg CAPS capsule    Meds ordered this encounter  Medications   molnupiravir EUA (LAGEVRIO) 200 mg CAPS capsule    Sig: Take 4 capsules (800 mg total) by mouth 2 (two) times daily for 5 days.    Dispense:  40 capsule    Refill:  0    Order Specific Question:   Supervising Provider    Answer:   ANDY, CAMILLE L [[id number]]    I discussed the assessment and treatment plan with the patient. The patient was provided an opportunity to ask questions and all were answered. The patient agreed with the plan and demonstrated an understanding of the instructions.   The patient was advised to call back or seek an in-person evaluation if the symptoms worsen or if the condition fails to improve as anticipated.  I provided 25 minutes of face-to-face time during this encounter.   Jeanie Sewer, NP [place] [phone] (phone) [phone] (fax)  [place]

## 2021-09-15 NOTE — Assessment & Plan Note (Signed)
pt does not feel that bad, concerned about antiviral side effects. Advised most people tolerate well, she could discuss with pharmacist also before starting. Advised based on her age, heart condition, immunocompromised husband, it is advised she be treated. Continue generic Coricidin and tylenol prn, drink plenty of water. Sending Molnupiravir, pt advised of FDA label for emergency use, how to take, & SE. Advised of CDC guidelines for self isolation/ ending isolation.  Advised of safe practice guidelines. Symptom Tier reviewed.  Encouraged to monitor for any worsening symptoms; watch for increased shortness of breath, weakness, and signs of dehydration. Instructed to rest and hydrate well.  Advised to wear a mask if necessary to leave the house.

## 2021-09-23 ENCOUNTER — Ambulatory Visit (HOSPITAL_COMMUNITY): Payer: Medicare HMO | Admitting: Physician Assistant

## 2021-09-24 ENCOUNTER — Other Ambulatory Visit: Payer: Self-pay | Admitting: Physician Assistant

## 2021-09-24 DIAGNOSIS — E876 Hypokalemia: Secondary | ICD-10-CM

## 2021-09-29 ENCOUNTER — Ambulatory Visit (HOSPITAL_COMMUNITY)
Admission: RE | Admit: 2021-09-29 | Discharge: 2021-09-29 | Disposition: A | Discharge status: Home / Self Care | Payer: Medicare HMO | Source: Ambulatory Visit | Attending: Physician Assistant | Admitting: Physician Assistant

## 2021-09-29 ENCOUNTER — Encounter (HOSPITAL_COMMUNITY): Payer: Self-pay | Admitting: Physician Assistant

## 2021-09-29 ENCOUNTER — Other Ambulatory Visit: Payer: Self-pay

## 2021-09-29 VITALS — BP 124/78 | HR 75 | Ht 67.0 in | Wt 160.4 lb

## 2021-09-29 DIAGNOSIS — Z79899 Other long term (current) drug therapy: Secondary | ICD-10-CM | POA: Insufficient documentation

## 2021-09-29 DIAGNOSIS — Z7901 Long term (current) use of anticoagulants: Secondary | ICD-10-CM | POA: Diagnosis not present

## 2021-09-29 DIAGNOSIS — I480 Paroxysmal atrial fibrillation: Secondary | ICD-10-CM | POA: Diagnosis not present

## 2021-09-29 DIAGNOSIS — R002 Palpitations: Secondary | ICD-10-CM | POA: Insufficient documentation

## 2021-09-29 DIAGNOSIS — E785 Hyperlipidemia, unspecified: Secondary | ICD-10-CM | POA: Insufficient documentation

## 2021-09-29 DIAGNOSIS — I10 Essential (primary) hypertension: Secondary | ICD-10-CM | POA: Insufficient documentation

## 2021-09-29 DIAGNOSIS — D6869 Other thrombophilia: Secondary | ICD-10-CM | POA: Diagnosis not present

## 2021-09-29 DIAGNOSIS — R400 Somnolence: Secondary | ICD-10-CM | POA: Insufficient documentation

## 2021-09-29 DIAGNOSIS — R0683 Snoring: Secondary | ICD-10-CM | POA: Diagnosis not present

## 2021-09-29 DIAGNOSIS — I4892 Unspecified atrial flutter: Secondary | ICD-10-CM | POA: Diagnosis not present

## 2021-09-29 MED ORDER — DILTIAZEM HCL 30 MG PO TABS: ORAL_TABLET | ORAL | 1 refills | Status: DC

## 2021-09-29 NOTE — Patient Instructions (Signed)
Cardizem 30mg -- take 1 tablet every 4 hours AS NEEDED for heart rate >100 as long as top number of blood pressure >100.  

## 2021-09-29 NOTE — Progress Notes (Signed)
Primary Care Physician: Inda Coke, PA Primary Cardiologist: Dr End (remotely) Primary Electrophysiologist: none Referring Physician: [facility]   Melinda [name] is a 71 y.o. female with a history of HLD, HTN, atrial fibrillation who presents for follow up in the [facility].  The patient was initially diagnosed with atrial fibrillation and atrial flutter on 02/04/21 when she presented to her PCP with palpitations. ECG showed rapid atrial flutter and she was sent to the ED. She was started on rate control and then her ECG showed afib. Patient was started on Xarelto for a CHADS2VASC score of 3. She since converted back to SR. She drinks very little alcohol but does admit to snoring.   On follow up today, patient reports that she has having palpitations about once per month. The longest episode lasted about 6 hours. There are no specific triggers that she can identify. She does admit to snoring and daytime somnolence.   Today, she denies symptoms of chest pain, shortness of breath, orthopnea, PND, lower extremity edema, dizziness, presyncope, syncope, snoring, daytime somnolence, bleeding, or neurologic sequela. The patient is tolerating medications without difficulties and is otherwise without complaint today.    Atrial Fibrillation Risk Factors:  she does have symptoms or diagnosis of sleep apnea. she does not have a history of rheumatic fever. she does not have a history of alcohol use. The patient does not have a history of early familial atrial fibrillation or other arrhythmias.  she has a BMI of Body mass index is 25.12 kg/m.Marland Kitchen Filed Weights   09/29/21 1448  Weight: 72.8 kg      Family History  Problem Relation Age of Onset   Hypertension Mother    Hypothyroidism Mother    Heart failure Mother    Stroke Mother    Bone cancer Father    Heart disease Maternal Grandfather 38   Hypertension Maternal Grandfather    Glaucoma Maternal  Grandmother    Mental illness Paternal Grandmother    Diabetes Paternal Grandfather    Angina Paternal Grandfather    Heart disease Brother    Hypertension Brother    Melanoma Paternal Uncle 16   Mental illness Paternal Aunt    Autoimmune disease Maternal Uncle    Prostate cancer Maternal Uncle    Kidney Stones Maternal Uncle    Stroke Maternal Uncle    Parkinson's disease Maternal Aunt    Hypertension Maternal Aunt    Glaucoma Maternal Aunt    Parkinson's disease Brother      Atrial Fibrillation Management history:  Previous antiarrhythmic drugs: none Previous cardioversions: none Previous ablations: none CHADS2VASC score: 3 Anticoagulation history: Xarelto   Past Medical History:  Diagnosis Date   Arthritis    Back problem    Cataracts, bilateral    Chicken pox    Disorder of adrenal gland ([facility])    Disorders of sulfur-bearing amino-acid metabolism, unspecified ([facility])    Displaced fracture of base of unspecified metacarpal bone, initial encounter for closed fracture    Gallstones    Hyperlipidemia    Hypertension    Insomnia    Iron deficiency anemia, unspecified    mostly vegetarian   Measles    Mumps    Sciatica    Sensitivity to medication    Sprain of unspecified ligament of right ankle, subsequent encounter    Vitamin D deficiency, unspecified    No past surgical history on file.  Current Outpatient Medications  Medication Sig Dispense Refill  Acetylcysteine (NAC) 600 MG CAPS Take 2 capsules by mouth daily in the afternoon.     Ascorbic Acid (VITAMIN C PO) Take 1 tablet by mouth daily in the afternoon. Vitamin C and Bio-Quercetin Phytosome     Cholecalciferol 1.25 MG (50000 UT) TABS Take 1 tablet by mouth once a week.     Cyanocobalamin (VITAMIN B-12 PO) Take 1 tablet by mouth daily in the afternoon.     Digestive Enzymes (DIGESTIVE ENZYME PO) Take 1 tablet by mouth daily in the afternoon. Primary Digest- Brand     diltiazem (CARDIZEM CD) 120 MG 24 hr  capsule Take 1 capsule by mouth once daily 30 capsule 3   GLYCINE PO Take 500 mg by mouth. Take 4 tabs at bedtime on an empty stomach or open and melt . May repeat in the middle of the night if needed     Iodine Strong, Lugols, (IODINE STRONG PO) Take 400 mcg by mouth.     IRON PO Take 1 tablet by mouth daily in the afternoon.     magnesium gluconate (MAGONATE) 500 MG tablet Take 500 mg by mouth daily in the afternoon.     MAGNESIUM GLYCINATE PO Take 1 tablet by mouth daily.     Multiple Vitamins-Minerals (MULTIVITAMIN WOMEN PO) Take 1 tablet by mouth daily in the afternoon. Vitamin Code 50 and wiser     NON FORMULARY P 5 P 50 mg  -Taking 1 tablet by mouth once a day     Nutritional Supplements (DETOX ENZYME FORMULA PO) Take by mouth. Reported on 01/21/2016     OVER THE COUNTER MEDICATION Take 4 tablets by mouth daily.     OVER THE COUNTER MEDICATION Take 1 capsule by mouth daily in the afternoon. Unique E-     OVER THE COUNTER MEDICATION Take 1 tablet by mouth daily in the afternoon. MacuGuard Ocular Support     OVER THE COUNTER MEDICATION Take 1 tablet by mouth daily in the afternoon. LV-GB Complex     OVER THE COUNTER MEDICATION Take 1 tablet by mouth daily. Hair Skin and Nails     potassium chloride (KLOR-CON) 10 MEQ tablet Take 1 tablet by mouth twice daily 60 tablet 0   UNABLE TO FIND CBD oil 1000mg      VITAMIN K PO Take 1 tablet by mouth daily. Potassium     XARELTO 20 MG TABS tablet TAKE 1 TABLET BY MOUTH ONCE DAILY WITH SUPPER 30 tablet 3   No current facility-administered medications for this encounter.    Allergies  Allergen Reactions   Pseudoephedrine Other (See Comments)    Hallucination     Social History   Socioeconomic History   Marital status: Married    Spouse name: Not on file   Number of children: Not on file   Years of education: Not on file   Highest education level: Not on file  Occupational History   Occupation: FREELANCE VIOLINIST   Occupation: TEACHER   Tobacco Use   Smoking status: Never   Smokeless tobacco: Never  Substance and Sexual Activity   Alcohol use: Yes    Alcohol/week: 1.0 standard drink    Types: 1 Standard drinks or equivalent per week    Comment: very small amount occasionally 09/29/21   Drug use: No   Sexual activity: Never  Other Topics Concern   Not on file  Social History Narrative   Freelance violinist   Lives with husband   2 children -- remain local  No grandchildren      Prefers Integrative Medicine, but okay with a DO. Previously saw Dr. Sharol Roussel. Pescatarian for the most part. Intermittent fasting with a 6 hour eating window. Hx of feeling sick after flu shot in the past, so declines flu shot. Hep C in 2013. Hx of breast biopsy. Hx of hypokalemia, previously on KLC 10 mEQ daily. Patient wants to lose 20 pounds in order to get BP at goal rather than try medication. FamHx of HTN. Previous exercise included HIIT at the gym. During COVID, has started baking more and not exercising as much. 03/03/2019    Social Determinants of Health   Financial Resource Strain: Not on file  Food Insecurity: Not on file  Transportation Needs: Not on file  Physical Activity: Not on file  Stress: Not on file  Social Connections: Not on file  Intimate Partner Violence: Not on file     ROS- All systems are reviewed and negative except as per the HPI above.  Physical Exam: Vitals:   09/29/21 1448  BP: 124/78  Pulse: 75  Weight: 72.8 kg  Height: 5\' 7"  (1.702 m)    GEN- The patient is a well appearing female, alert and oriented x 3 today.   HEENT-head normocephalic, atraumatic, sclera clear, conjunctiva pink, hearing intact, trachea midline. Lungs- Clear to ausculation bilaterally, normal work of breathing Heart- Regular rate and rhythm, no murmurs, rubs or gallops  GI- soft, NT, ND, + BS Extremities- no clubbing, cyanosis, or edema MS- no significant deformity or atrophy Skin- no rash or lesion Psych- euthymic mood,  full affect Neuro- strength and sensation are intact   Wt Readings from Last 3 Encounters:  09/29/21 72.8 kg  09/15/21 73.5 kg  04/06/21 73.5 kg    EKG today demonstrates  SR Vent. rate 75 BPM PR interval 130 ms QRS duration 78 ms QT/QTcB 406/453 ms  Echo 03/02/21  1. Left ventricular ejection fraction by 3D volume is 62 %. The left  ventricle has normal function. The left ventricle has no regional wall  motion abnormalities. Left ventricular diastolic parameters were normal.   2. Right ventricular systolic function is normal. The right ventricular  size is normal. There is normal pulmonary artery systolic pressure.   3. The mitral valve is normal in structure. Mild mitral valve  regurgitation. No evidence of mitral stenosis.   4. The aortic valve is grossly normal. Aortic valve regurgitation is  trivial. No aortic stenosis is present.   Epic records are reviewed at length today  CHA2DS2-VASc Score = 3  The patient's score is based upon: CHF History: 0 HTN History: 1 Diabetes History: 0 Stroke History: 0 Vascular Disease History: 0 Age Score: 1 Gender Score: 1      ASSESSMENT AND PLAN: 1. Paroxsymal Atrial Fibrillation/atrial flutter The patient's CHA2DS2-VASc score is 3, indicating a 3.2% annual risk of stroke.   Patient having palpitations about once per month.  We discussed therapeutic options today including AAD, PRN med, and ablation.  She would like to try diltiazem 30 mg PRN q 4 hours for heart racing/palpitations.  If her afib becomes more persistent/frequent then she would consider AAD. She would like to avoid ablation if possible.  Continue Xarelto 20 mg daily Continue diltiazem 120 mg daily  2. Secondary Hypercoagulable State (ICD10:  D68.69) The patient is at significant risk for stroke/thromboembolism based upon her CHA2DS2-VASc Score of 3.  Continue Rivaroxaban (Xarelto).   3. HTN Stable, no changes today.   Follow up in  the AF clinic in 3  months.    [facility] [address] [place], [place] [address] [phone] 09/29/2021 3:12 PM

## 2021-10-14 ENCOUNTER — Telehealth: Payer: Self-pay | Admitting: Physician Assistant

## 2021-10-14 NOTE — Telephone Encounter (Signed)
Copied from [place] [id number]. Topic: Medicare AWV ?>> Oct 14, 2021 11:35 AM Harris-Coley, Hannah Beat wrote: ?Reason for CRM: Left message for patient to schedule Annual Wellness Visit.  Please schedule with Nurse Health Advisor Charlott Rakes, RN at [facility].  Please call [phone] ask for Juliann Pulse ?

## 2021-10-23 ENCOUNTER — Other Ambulatory Visit (HOSPITAL_COMMUNITY): Payer: Self-pay | Admitting: Physician Assistant

## 2021-10-23 ENCOUNTER — Other Ambulatory Visit: Payer: Self-pay | Admitting: Physician Assistant

## 2021-10-23 DIAGNOSIS — E876 Hypokalemia: Secondary | ICD-10-CM

## 2021-10-29 ENCOUNTER — Ambulatory Visit: Payer: Medicare HMO | Admitting: Physician Assistant

## 2021-11-01 ENCOUNTER — Ambulatory Visit (INDEPENDENT_AMBULATORY_CARE_PROVIDER_SITE_OTHER): Payer: Medicare HMO | Admitting: Physician Assistant

## 2021-11-01 ENCOUNTER — Encounter: Payer: Self-pay | Admitting: Physician Assistant

## 2021-11-01 VITALS — BP 159/87 | HR 65 | Temp 96.1°F | Ht 67.0 in | Wt 161.8 lb

## 2021-11-01 DIAGNOSIS — R5383 Other fatigue: Secondary | ICD-10-CM | POA: Diagnosis not present

## 2021-11-01 DIAGNOSIS — D649 Anemia, unspecified: Secondary | ICD-10-CM | POA: Diagnosis not present

## 2021-11-01 DIAGNOSIS — I4891 Unspecified atrial fibrillation: Secondary | ICD-10-CM | POA: Diagnosis not present

## 2021-11-01 LAB — COMPREHENSIVE METABOLIC PANEL
ALT: 28 U/L (ref 0–35)
AST: 25 U/L (ref 0–37)
Albumin: 4.6 g/dL (ref 3.5–5.2)
Alkaline Phosphatase: 80 U/L (ref 39–117)
BUN: 10 mg/dL (ref 6–23)
CO2: 31 mEq/L (ref 19–32)
Calcium: 9.8 mg/dL (ref 8.4–10.5)
Chloride: 107 mEq/L (ref 96–112)
Creatinine, Ser: 0.71 mg/dL (ref 0.40–1.20)
GFR: 87.4 mL/min (ref >60.00)
Glucose, Bld: 89 mg/dL (ref 70–99)
Potassium: 5 mEq/L (ref 3.5–5.1)
Sodium: 143 mEq/L (ref 135–145)
Total Bilirubin: 0.6 mg/dL (ref 0.2–1.2)
Total Protein: 6.9 g/dL (ref 6.0–8.3)

## 2021-11-01 LAB — CBC WITH DIFFERENTIAL/PLATELET
Basophils Absolute: 0 10*3/uL (ref 0.0–0.1)
Basophils Relative: 1.2 % (ref 0.0–3.0)
Eosinophils Absolute: 0.1 10*3/uL (ref 0.0–0.7)
Eosinophils Relative: 3 % (ref 0.0–5.0)
HCT: 36.7 % (ref 36.0–46.0)
Hemoglobin: 12.3 g/dL (ref 12.0–15.0)
Lymphocytes Relative: 31.5 % (ref 12.0–46.0)
Lymphs Abs: 1.1 10*3/uL (ref 0.7–4.0)
MCHC: 33.5 g/dL (ref 30.0–36.0)
MCV: 87.8 fl (ref 78.0–100.0)
Monocytes Absolute: 0.4 10*3/uL (ref 0.1–1.0)
Monocytes Relative: 11.3 % (ref 3.0–12.0)
Neutro Abs: 1.9 10*3/uL (ref 1.4–7.7)
Neutrophils Relative %: 53 % (ref 43.0–77.0)
Platelets: 129 10*3/uL — ABNORMAL LOW (ref 150.0–400.0)
RBC: 4.19 Mil/uL (ref 3.87–5.11)
RDW: 13.5 % (ref 11.5–15.5)
WBC: 3.6 10*3/uL — ABNORMAL LOW (ref 4.0–10.5)

## 2021-11-01 NOTE — Patient Instructions (Signed)
It was great to see you! ? ?We will update blood work and I will be in touch with your results. ? ?Take care, ? ?Inda Coke PA-C  ?

## 2021-11-01 NOTE — Progress Notes (Signed)
Melinda [name] is a 71 y.o. female here for anemia. ? ?History of Present Illness:  ? ?Chief Complaint  ?Patient presents with  ? Anemia  ?  Requesting blood work for Iron level  ?Hx of anemia been taking supplement   ? ? ?Anemia  ?[name] presents with desire to update blood work to see if she needs to continue taking her OTC iron supplement daily. States that prior to her follow up with [place] PA-C with a-fib clinic, her brother discussed with her that having an elevated iron level could affect her atrial fibrillation. Due to this she would like to update her levels today. Pt is currently compliant with taking an OTC iron supplement daily with no complications. Denies concerning sx -- denies rectal bleeding or vaginal bleeding. ? ?A-fib  ?Pt has been compliant with taking diltiazem 120 mg daily and xarelto 20 mg daily with no complications. States that she has been monitoring her A-fib via her new apple watch and has found her episodes have decreased. She is regularly following up with Dr. Marlene Lard, cardiology and doing what she can to avoid having to complete an ablation.  ? ?Fatigue ?Although Karson admits she is mainly up at night due to her creative mind, she would like to update these labs to make sure nothing more significant is occurring. Denies concerning sx other than ongoing afib episodes. She does not eat a lot of red meat. She takes her health supplements as able -- not necessarily daily. ? ? ?Past Medical History:  ?Diagnosis Date  ? Arthritis   ? Back problem   ? Cataracts, bilateral   ? Chicken pox   ? Disorder of adrenal gland ([place])   ? Disorders of sulfur-bearing amino-acid metabolism, unspecified ([place])   ? Displaced fracture of base of unspecified metacarpal bone, initial encounter for closed fracture   ? Gallstones   ? Hyperlipidemia   ? Hypertension   ? Insomnia   ? Iron deficiency anemia, unspecified   ? mostly vegetarian  ? Measles   ? Mumps   ? Sciatica   ? Sensitivity to medication   ? Sprain of  unspecified ligament of right ankle, subsequent encounter   ? Vitamin D deficiency, unspecified   ? ?  ?Social History  ? ?Tobacco Use  ? Smoking status: Never  ? Smokeless tobacco: Never  ?Substance Use Topics  ? Alcohol use: Yes  ?  Alcohol/week: 1.0 standard drink  ?  Types: 1 Standard drinks or equivalent per week  ?  Comment: very small amount occasionally 09/29/21  ? Drug use: No  ? ? ?History reviewed. No pertinent surgical history. ? ?Family History  ?Problem Relation Age of Onset  ? Hypertension Mother   ? Hypothyroidism Mother   ? Heart failure Mother   ? Stroke Mother   ? Bone cancer Father   ? Heart disease Maternal Grandfather 76  ? Hypertension Maternal Grandfather   ? Glaucoma Maternal Grandmother   ? Mental illness Paternal Grandmother   ? Diabetes Paternal Grandfather   ? Angina Paternal Grandfather   ? Heart disease Brother   ? Hypertension Brother   ? Melanoma Paternal Uncle 101  ? Mental illness Paternal Aunt   ? Autoimmune disease Maternal Uncle   ? Prostate cancer Maternal Uncle   ? Kidney Stones Maternal Uncle   ? Stroke Maternal Uncle   ? Parkinson's disease Maternal Aunt   ? Hypertension Maternal Aunt   ? Glaucoma Maternal Aunt   ? Parkinson's disease  Brother   ? ? ?Allergies  ?Allergen Reactions  ? Pseudoephedrine Other (See Comments)  ?  Hallucination   ? ? ?Current Medications:  ? ?Current Outpatient Medications:  ?  Acetylcysteine (NAC) 600 MG CAPS, Take 2 capsules by mouth daily in the afternoon., Disp: , Rfl:  ?  Ascorbic Acid (VITAMIN C PO), Take 1 tablet by mouth daily in the afternoon. Vitamin C and Bio-Quercetin Phytosome, Disp: , Rfl:  ?  Cholecalciferol 1.25 MG (50000 UT) TABS, Take 1 tablet by mouth once a week., Disp: , Rfl:  ?  Cyanocobalamin (VITAMIN B-12 PO), Take 1 tablet by mouth daily in the afternoon., Disp: , Rfl:  ?  Digestive Enzymes (DIGESTIVE ENZYME PO), Take 1 tablet by mouth daily in the afternoon. Primary Digest- Brand, Disp: , Rfl:  ?  diltiazem (CARDIZEM CD)  120 MG 24 hr capsule, Take 1 capsule by mouth once daily, Disp: 30 capsule, Rfl: 6 ?  diltiazem (CARDIZEM) 30 MG tablet, Take 1 tablet every 4 hours AS NEEDED for heart rate >100, Disp: 30 tablet, Rfl: 1 ?  GLYCINE PO, Take 500 mg by mouth. Take 4 tabs at bedtime on an empty stomach or open and melt . May repeat in the middle of the night if needed, Disp: , Rfl:  ?  Iodine Strong, Lugols, (IODINE STRONG PO), Take 400 mcg by mouth., Disp: , Rfl:  ?  IRON PO, Take 1 tablet by mouth daily in the afternoon., Disp: , Rfl:  ?  magnesium gluconate (MAGONATE) 500 MG tablet, Take 500 mg by mouth daily in the afternoon., Disp: , Rfl:  ?  MAGNESIUM GLYCINATE PO, Take 1 tablet by mouth daily., Disp: , Rfl:  ?  Multiple Vitamins-Minerals (MULTIVITAMIN WOMEN PO), Take 1 tablet by mouth daily in the afternoon. Vitamin Code 50 and wiser, Disp: , Rfl:  ?  NON FORMULARY, P 5 P 50 mg  -Taking 1 tablet by mouth once a day, Disp: , Rfl:  ?  Nutritional Supplements (DETOX ENZYME FORMULA PO), Take by mouth. Reported on 01/21/2016, Disp: , Rfl:  ?  OVER THE COUNTER MEDICATION, Take 4 tablets by mouth daily., Disp: , Rfl:  ?  OVER THE COUNTER MEDICATION, Take 1 capsule by mouth daily in the afternoon. Unique E-, Disp: , Rfl:  ?  OVER THE COUNTER MEDICATION, Take 1 tablet by mouth daily in the afternoon. MacuGuard Ocular Support, Disp: , Rfl:  ?  OVER THE COUNTER MEDICATION, Take 1 tablet by mouth daily in the afternoon. LV-GB Complex, Disp: , Rfl:  ?  OVER THE COUNTER MEDICATION, Take 1 tablet by mouth daily. Hair Skin and Nails, Disp: , Rfl:  ?  potassium chloride (KLOR-CON) 10 MEQ tablet, Take 1 tablet by mouth twice daily, Disp: 60 tablet, Rfl: 0 ?  UNABLE TO FIND, CBD oil '1000mg'$ , Disp: , Rfl:  ?  VITAMIN K PO, Take 1 tablet by mouth daily. Potassium, Disp: , Rfl:  ?  XARELTO 20 MG TABS tablet, TAKE 1 TABLET BY MOUTH ONCE DAILY WITH SUPPER, Disp: 30 tablet, Rfl: 6  ? ?Review of Systems:  ? ?ROS ?Negative unless otherwise specified per  HPI. ?Vitals:  ? ?Vitals:  ? 11/01/21 1017  ?BP: (!) 159/87  ?Pulse: 65  ?Temp: (!) 96.1 ?F (35.6 ?C)  ?TempSrc: Temporal  ?SpO2: 98%  ?Weight: 161 lb 12.8 oz (73.4 kg)  ?Height: '5\' 7"'$  (1.702 m)  ?   ?Body mass index is 25.34 kg/m?. ? ?Physical Exam:  ? ?Physical Exam ?  Vitals and nursing note reviewed.  ?Constitutional:   ?   General: She is not in acute distress. ?   Appearance: She is well-developed. She is not ill-appearing or toxic-appearing.  ?Cardiovascular:  ?   Rate and Rhythm: Normal rate and regular rhythm.  ?   Pulses: Normal pulses.  ?   Heart sounds: Normal heart sounds, S1 normal and S2 normal.  ?Pulmonary:  ?   Effort: Pulmonary effort is normal.  ?   Breath sounds: Normal breath sounds.  ?Skin: ?   General: Skin is warm and dry.  ?Neurological:  ?   Mental Status: She is alert.  ?   GCS: GCS eye subscore is 4. GCS verbal subscore is 5. GCS motor subscore is 6.  ?Psychiatric:     ?   Speech: Speech normal.     ?   Behavior: Behavior normal. Behavior is cooperative.  ? ? ?Assessment and Plan:  ? ?Atrial fibrillation, unspecified type ([place]) ?Stable  ?Continue diltiazem 120 mg daily and xarelto 20 mg daily  ?Management per Derrell Lolling, cardiology ? ?Other fatigue ?Update basic labs, will make recommendations accordingly  ?Follow-up if symptoms persist ?Continue to work on better sleep habits ? ?Anemia, unspecified type ?Update labs, will make recommendations accordingly  ?Continue iron supplement daily as based on results  ? ?I,Havlyn C Ratchford,acting as a scribe for [employer], PA.,have documented all relevant documentation on the behalf of Inda Coke, PA,as directed by  Inda Coke, PA while in the presence of Inda Coke, [place]. ? ?IInda Coke, PA, have reviewed all documentation for this visit. The documentation on 11/01/21 for the exam, diagnosis, procedures, and orders are all accurate and complete. ? ? ?Inda Coke, PA-C ? ?

## 2021-11-02 LAB — IBC + FERRITIN
Ferritin: 93.6 ng/mL (ref 10.0–291.0)
Iron: 152 ug/dL — ABNORMAL HIGH (ref 42–145)
Saturation Ratios: 45.4 % (ref 20.0–50.0)
TIBC: 334.6 ug/dL (ref 250.0–450.0)
Transferrin: 239 mg/dL (ref 212.0–360.0)

## 2021-11-02 LAB — B12 AND FOLATE PANEL
Folate: >24.2 ng/mL (ref >5.9)
Vitamin B-12: >1504 pg/mL — ABNORMAL HIGH (ref 211–911)

## 2021-12-28 ENCOUNTER — Ambulatory Visit (HOSPITAL_COMMUNITY): Payer: Medicare HMO | Admitting: Physician Assistant

## 2021-12-28 ENCOUNTER — Ambulatory Visit (HOSPITAL_COMMUNITY)
Admission: RE | Admit: 2021-12-28 | Discharge: 2021-12-28 | Disposition: A | Discharge status: Home / Self Care | Payer: Medicare HMO | Source: Ambulatory Visit | Attending: Physician Assistant | Admitting: Physician Assistant

## 2021-12-28 ENCOUNTER — Encounter (HOSPITAL_COMMUNITY): Payer: Self-pay | Admitting: Physician Assistant

## 2021-12-28 VITALS — BP 140/80 | HR 79 | Ht 67.0 in | Wt 161.2 lb

## 2021-12-28 DIAGNOSIS — I480 Paroxysmal atrial fibrillation: Secondary | ICD-10-CM | POA: Insufficient documentation

## 2021-12-28 DIAGNOSIS — I10 Essential (primary) hypertension: Secondary | ICD-10-CM | POA: Diagnosis not present

## 2021-12-28 DIAGNOSIS — I4892 Unspecified atrial flutter: Secondary | ICD-10-CM | POA: Insufficient documentation

## 2021-12-28 DIAGNOSIS — D6869 Other thrombophilia: Secondary | ICD-10-CM | POA: Insufficient documentation

## 2021-12-28 DIAGNOSIS — Z79899 Other long term (current) drug therapy: Secondary | ICD-10-CM | POA: Insufficient documentation

## 2021-12-28 DIAGNOSIS — Z7901 Long term (current) use of anticoagulants: Secondary | ICD-10-CM | POA: Diagnosis not present

## 2021-12-28 DIAGNOSIS — E785 Hyperlipidemia, unspecified: Secondary | ICD-10-CM | POA: Insufficient documentation

## 2021-12-28 NOTE — Progress Notes (Signed)
Primary Care Physician: Inda Coke, PA Primary Cardiologist: Dr End (remotely) Primary Electrophysiologist: none Referring Physician: [facility]   Melinda [name] is a 71 y.o. female with a history of HLD, HTN, atrial fibrillation who presents for follow up in the [facility].  The patient was initially diagnosed with atrial fibrillation and atrial flutter on 02/04/21 when she presented to her PCP with palpitations. ECG showed rapid atrial flutter and she was sent to the ED. She was started on rate control and then her ECG showed afib. Patient was started on Xarelto for a CHADS2VASC score of 3. She since converted back to SR. She drinks very little alcohol but does admit to snoring.   On follow up today, patient purchased an Apple Watch since her last visit and it did show one episode of afib. She took her PRN diltiazem and the afib resolved in under an hour. There were no specific triggers that she could identify. No bleeding issues on anticoagulation.   Today, she denies symptoms of palpitations, chest pain, shortness of breath, orthopnea, PND, lower extremity edema, dizziness, presyncope, syncope, bleeding, or neurologic sequela. The patient is tolerating medications without difficulties and is otherwise without complaint today.    Atrial Fibrillation Risk Factors:  she does have symptoms or diagnosis of sleep apnea. Patient deferred sleep study. she does not have a history of rheumatic fever. she does not have a history of alcohol use. The patient does not have a history of early familial atrial fibrillation or other arrhythmias.  she has a BMI of Body mass index is 25.25 kg/m.Marland Kitchen Filed Weights   12/28/21 1409  Weight: 73.1 kg     Family History  Problem Relation Age of Onset   Hypertension Mother    Hypothyroidism Mother    Heart failure Mother    Stroke Mother    Bone cancer Father    Heart disease Maternal Grandfather 16    Hypertension Maternal Grandfather    Glaucoma Maternal Grandmother    Mental illness Paternal Grandmother    Diabetes Paternal Grandfather    Angina Paternal Grandfather    Heart disease Brother    Hypertension Brother    Melanoma Paternal Uncle 68   Mental illness Paternal Aunt    Autoimmune disease Maternal Uncle    Prostate cancer Maternal Uncle    Kidney Stones Maternal Uncle    Stroke Maternal Uncle    Parkinson's disease Maternal Aunt    Hypertension Maternal Aunt    Glaucoma Maternal Aunt    Parkinson's disease Brother      Atrial Fibrillation Management history:  Previous antiarrhythmic drugs: none Previous cardioversions: none Previous ablations: none CHADS2VASC score: 3 Anticoagulation history: Xarelto   Past Medical History:  Diagnosis Date   Arthritis    Back problem    Cataracts, bilateral    Chicken pox    Disorder of adrenal gland ([facility])    Disorders of sulfur-bearing amino-acid metabolism, unspecified ([facility])    Displaced fracture of base of unspecified metacarpal bone, initial encounter for closed fracture    Gallstones    Hyperlipidemia    Hypertension    Insomnia    Iron deficiency anemia, unspecified    mostly vegetarian   Measles    Mumps    Sciatica    Sensitivity to medication    Sprain of unspecified ligament of right ankle, subsequent encounter    Vitamin D deficiency, unspecified    No past surgical history on file.  Current Outpatient Medications  Medication Sig Dispense Refill   Acetylcysteine (NAC) 600 MG CAPS Take 2 capsules by mouth daily in the afternoon.     Ascorbic Acid (VITAMIN C PO) Take 1 tablet by mouth daily in the afternoon. Vitamin C and Bio-Quercetin Phytosome     Cholecalciferol 1.25 MG (50000 UT) TABS Take 1 tablet by mouth once a week.     Digestive Enzymes (DIGESTIVE ENZYME PO) Take 1 tablet by mouth daily in the afternoon. Primary Digest- Brand     diltiazem (CARDIZEM CD) 120 MG 24 hr capsule Take 1 capsule by  mouth once daily 30 capsule 6   diltiazem (CARDIZEM) 30 MG tablet Take 1 tablet every 4 hours AS NEEDED for heart rate >100 30 tablet 1   GLYCINE PO Take 500 mg by mouth. Take 4 tabs at bedtime on an empty stomach or open and melt . May repeat in the middle of the night if needed     Iodine Strong, Lugols, (IODINE STRONG PO) Take 400 mcg by mouth.     magnesium gluconate (MAGONATE) 500 MG tablet Take 500 mg by mouth daily in the afternoon.     MAGNESIUM GLYCINATE PO Take 1 tablet by mouth daily.     Multiple Vitamins-Minerals (MULTIVITAMIN WOMEN PO) Take 1 tablet by mouth daily in the afternoon. Vitamin Code 50 and wiser     NON FORMULARY P 5 P 50 mg  -Taking 1 tablet by mouth once a day     Nutritional Supplements (DETOX ENZYME FORMULA PO) Take by mouth. Reported on 01/21/2016     OVER THE COUNTER MEDICATION Take 4 tablets by mouth daily.     OVER THE COUNTER MEDICATION Take 1 capsule by mouth daily in the afternoon. Unique E-     OVER THE COUNTER MEDICATION Take 1 tablet by mouth daily in the afternoon. MacuGuard Ocular Support     OVER THE COUNTER MEDICATION Take 1 tablet by mouth daily in the afternoon. LV-GB Complex     OVER THE COUNTER MEDICATION Take 1 tablet by mouth daily. Hair Skin and Nails     potassium chloride (KLOR-CON) 10 MEQ tablet Take 1 tablet by mouth twice daily 60 tablet 0   UNABLE TO FIND CBD oil '1000mg'$      VITAMIN K PO Take 1 tablet by mouth daily. Potassium     XARELTO 20 MG TABS tablet TAKE 1 TABLET BY MOUTH ONCE DAILY WITH SUPPER 30 tablet 6   No current facility-administered medications for this encounter.    Allergies  Allergen Reactions   Pseudoephedrine Other (See Comments)    Hallucination     Social History   Socioeconomic History   Marital status: Married    Spouse name: Not on file   Number of children: Not on file   Years of education: Not on file   Highest education level: Not on file  Occupational History   Occupation: FREELANCE VIOLINIST    Occupation: TEACHER  Tobacco Use   Smoking status: Never   Smokeless tobacco: Never   Tobacco comments:    Never smoke 12/28/21  Substance and Sexual Activity   Alcohol use: Yes    Alcohol/week: 1.0 standard drink    Types: 1 Standard drinks or equivalent per week    Comment: very small amount occasionally 09/29/21   Drug use: No   Sexual activity: Never  Other Topics Concern   Not on file  Social History Narrative   Freelance violinist   Lives with  husband   2 children -- remain local   No grandchildren      Prefers Integrative Medicine, but okay with a DO. Previously saw Dr. Sharol Roussel. Pescatarian for the most part. Intermittent fasting with a 6 hour eating window. Hx of feeling sick after flu shot in the past, so declines flu shot. Hep C in 2013. Hx of breast biopsy. Hx of hypokalemia, previously on KLC 10 mEQ daily. Patient wants to lose 20 pounds in order to get BP at goal rather than try medication. FamHx of HTN. Previous exercise included HIIT at the gym. During COVID, has started baking more and not exercising as much. 03/03/2019    Social Determinants of Health   Financial Resource Strain: Not on file  Food Insecurity: Not on file  Transportation Needs: Not on file  Physical Activity: Not on file  Stress: Not on file  Social Connections: Not on file  Intimate Partner Violence: Not on file     ROS- All systems are reviewed and negative except as per the HPI above.  Physical Exam: Vitals:   12/28/21 1409  BP: 140/80  Pulse: 79  Weight: 73.1 kg  Height: '5\' 7"'$  (1.702 m)     GEN- The patient is a well appearing female, alert and oriented x 3 today.   HEENT-head normocephalic, atraumatic, sclera clear, conjunctiva pink, hearing intact, trachea midline. Lungs- Clear to ausculation bilaterally, normal work of breathing Heart- Regular rate and rhythm, no murmurs, rubs or gallops  GI- soft, NT, ND, + BS Extremities- no clubbing, cyanosis, or edema MS- no significant  deformity or atrophy Skin- no rash or lesion Psych- euthymic mood, full affect Neuro- strength and sensation are intact   Wt Readings from Last 3 Encounters:  12/28/21 73.1 kg  11/01/21 73.4 kg  09/29/21 72.8 kg    EKG today demonstrates  SR Vent. rate 79 BPM PR interval 128 ms QRS duration 78 ms QT/QTcB 392/449 ms  Echo 03/02/21  1. Left ventricular ejection fraction by 3D volume is 62 %. The left  ventricle has normal function. The left ventricle has no regional wall  motion abnormalities. Left ventricular diastolic parameters were normal.   2. Right ventricular systolic function is normal. The right ventricular  size is normal. There is normal pulmonary artery systolic pressure.   3. The mitral valve is normal in structure. Mild mitral valve  regurgitation. No evidence of mitral stenosis.   4. The aortic valve is grossly normal. Aortic valve regurgitation is  trivial. No aortic stenosis is present.   Epic records are reviewed at length today  CHA2DS2-VASc Score = 3  The patient's score is based upon: CHF History: 0 HTN History: 1 Diabetes History: 0 Stroke History: 0 Vascular Disease History: 0 Age Score: 1 Gender Score: 1      ASSESSMENT AND PLAN: 1. Paroxsymal Atrial Fibrillation/atrial flutter The patient's CHA2DS2-VASc score is 3, indicating a 3.2% annual risk of stroke.   Patient had one episode that responded well to diltiazem.  Continue diltiazem 30 mg PRN q 4 hours for heart racing/palpitations.  If her afib becomes more persistent/frequent then she would consider AAD. She would like to avoid ablation if possible.  Continue Xarelto 20 mg daily Continue diltiazem 120 mg daily Apple watch for home monitoring.  2. Secondary Hypercoagulable State (ICD10:  D68.69) The patient is at significant risk for stroke/thromboembolism based upon her CHA2DS2-VASc Score of 3.  Continue Rivaroxaban (Xarelto).   3. HTN Stable, no changes today.   Follow  up in the  AF clinic in 6 months.    [facility] [address] [place], [place] [address] [phone] 12/28/2021 2:36 PM

## 2022-01-18 ENCOUNTER — Other Ambulatory Visit: Payer: Self-pay | Admitting: Physician Assistant

## 2022-01-18 DIAGNOSIS — E876 Hypokalemia: Secondary | ICD-10-CM

## 2022-01-21 DIAGNOSIS — H5213 Myopia, bilateral: Secondary | ICD-10-CM | POA: Diagnosis not present

## 2022-02-16 ENCOUNTER — Encounter: Payer: Self-pay | Admitting: Family

## 2022-02-16 ENCOUNTER — Ambulatory Visit (INDEPENDENT_AMBULATORY_CARE_PROVIDER_SITE_OTHER): Payer: Medicare HMO | Admitting: Family

## 2022-02-16 VITALS — BP 159/82 | HR 69 | Temp 98.4°F | Ht 67.0 in | Wt 162.5 lb

## 2022-02-16 DIAGNOSIS — R2242 Localized swelling, mass and lump, left lower limb: Secondary | ICD-10-CM

## 2022-02-16 NOTE — Progress Notes (Signed)
Patient ID: Melinda [name], female    DOB: [birth year], 71 y.o.   MRN: [id number]  Chief Complaint  Patient presents with   Mass    Pt c/o bump under left knee, Not itchy, no redness and not painful. Present for 2 weeks. Not sure if she was bit.     HPI: Skin Nodule: Patient complains of a skin nodule of the knee. The nodule has been present for 2 weeks. It has not changed in size since that time. Symptoms associated with the nodule are: none. Patient denies increasing diameter, itching, bleeding, pain, or drainage.   Assessment & Plan:  1. Skin lump of leg, left anterior, just below left knee, approx. 1.5cm in diameter, soft, fixed, very slight greenish hue, unsure if varicose vein or cyst. Denies any injury, just noticed a couple of weeks ago, non-tender. Advised on U/S vs Dermatology referral, pt would prefer DERM to further assess.   - Ambulatory referral to Dermatology   Subjective:    Outpatient Medications Prior to Visit  Medication Sig Dispense Refill   Acetylcysteine (NAC) 600 MG CAPS Take 2 capsules by mouth daily in the afternoon.     Ascorbic Acid (VITAMIN C PO) Take 1 tablet by mouth daily in the afternoon. Vitamin C and Bio-Quercetin Phytosome     Cholecalciferol 1.25 MG (50000 UT) TABS Take 1 tablet by mouth once a week.     Digestive Enzymes (DIGESTIVE ENZYME PO) Take 1 tablet by mouth daily in the afternoon. Primary Digest- Brand     diltiazem (CARDIZEM CD) 120 MG 24 hr capsule Take 1 capsule by mouth once daily 30 capsule 6   diltiazem (CARDIZEM) 30 MG tablet Take 1 tablet every 4 hours AS NEEDED for heart rate >100 30 tablet 1   GLYCINE PO Take 500 mg by mouth. Take 4 tabs at bedtime on an empty stomach or open and melt . May repeat in the middle of the night if needed     Iodine Strong, Lugols, (IODINE STRONG PO) Take 400 mcg by mouth.     magnesium gluconate (MAGONATE) 500 MG tablet Take 500 mg by mouth daily in the afternoon.     MAGNESIUM GLYCINATE PO Take 1  tablet by mouth daily.     Multiple Vitamins-Minerals (MULTIVITAMIN WOMEN PO) Take 1 tablet by mouth daily in the afternoon. Vitamin Code 50 and wiser     NON FORMULARY P 5 P 50 mg  -Taking 1 tablet by mouth once a day     Nutritional Supplements (DETOX ENZYME FORMULA PO) Take by mouth. Reported on 01/21/2016     OVER THE COUNTER MEDICATION Take 4 tablets by mouth daily.     OVER THE COUNTER MEDICATION Take 1 capsule by mouth daily in the afternoon. Unique E-     OVER THE COUNTER MEDICATION Take 1 tablet by mouth daily in the afternoon. MacuGuard Ocular Support     OVER THE COUNTER MEDICATION Take 1 tablet by mouth daily in the afternoon. LV-GB Complex     OVER THE COUNTER MEDICATION Take 1 tablet by mouth daily. Hair Skin and Nails     potassium chloride (KLOR-CON) 10 MEQ tablet Take 1 tablet by mouth twice daily 60 tablet 0   UNABLE TO FIND CBD oil '1000mg'$      VITAMIN K PO Take 1 tablet by mouth daily. Potassium     XARELTO 20 MG TABS tablet TAKE 1 TABLET BY MOUTH ONCE DAILY WITH SUPPER 30 tablet 6  No facility-administered medications prior to visit.   Past Medical History:  Diagnosis Date   Arthritis    Back problem    Cataracts, bilateral    Chicken pox    Disorder of adrenal gland ([place])    Disorders of sulfur-bearing amino-acid metabolism, unspecified ([place])    Displaced fracture of base of unspecified metacarpal bone, initial encounter for closed fracture    Gallstones    Hyperlipidemia    Hypertension    Insomnia    Iron deficiency anemia, unspecified    mostly vegetarian   Measles    Mumps    Sciatica    Sensitivity to medication    Sprain of unspecified ligament of right ankle, subsequent encounter    Vitamin D deficiency, unspecified    No past surgical history on file. Allergies  Allergen Reactions   Pseudoephedrine Other (See Comments)    Hallucination       Objective:    Physical Exam Vitals and nursing note reviewed.  Constitutional:      Appearance:  Normal appearance.  Cardiovascular:     Rate and Rhythm: Normal rate and regular rhythm.  Pulmonary:     Effort: Pulmonary effort is normal.     Breath sounds: Normal breath sounds.  Musculoskeletal:        General: Normal range of motion.  Skin:    General: Skin is warm and dry.          Comments: pt with soft 2cm. diameter nodule, fixed, non-tender, w/no erythema, bruising, or warmth. see diagram above.  Neurological:     Mental Status: She is alert.  Psychiatric:        Mood and Affect: Mood normal.        Behavior: Behavior normal.    BP (!) 159/82 (BP Location: Left Arm, Patient Position: Sitting, Cuff Size: Large)   Pulse 69   Temp 98.4 F (36.9 C) (Temporal)   Ht '5\' 7"'$  (1.702 m)   Wt 162 lb 8 oz (73.7 kg)   LMP 08/04/2009   SpO2 97%   BMI 25.45 kg/m  Wt Readings from Last 3 Encounters:  02/16/22 162 lb 8 oz (73.7 kg)  12/28/21 161 lb 3.2 oz (73.1 kg)  11/01/21 161 lb 12.8 oz (73.4 kg)       Jeanie Sewer, NP

## 2022-02-21 DIAGNOSIS — N3941 Urge incontinence: Secondary | ICD-10-CM | POA: Diagnosis not present

## 2022-02-21 DIAGNOSIS — N939 Abnormal uterine and vaginal bleeding, unspecified: Secondary | ICD-10-CM | POA: Diagnosis not present

## 2022-02-21 DIAGNOSIS — N898 Other specified noninflammatory disorders of vagina: Secondary | ICD-10-CM | POA: Diagnosis not present

## 2022-02-28 ENCOUNTER — Ambulatory Visit (INDEPENDENT_AMBULATORY_CARE_PROVIDER_SITE_OTHER): Payer: Medicare HMO | Admitting: Physician Assistant

## 2022-02-28 VITALS — BP 136/74 | HR 70 | Temp 98.6°F | Ht 67.0 in | Wt 162.6 lb

## 2022-02-28 DIAGNOSIS — L03113 Cellulitis of right upper limb: Secondary | ICD-10-CM | POA: Diagnosis not present

## 2022-02-28 DIAGNOSIS — R350 Frequency of micturition: Secondary | ICD-10-CM

## 2022-02-28 MED ORDER — DOXYCYCLINE HYCLATE 100 MG PO TABS: 100.0000 mg | ORAL_TABLET | Freq: Two times a day (BID) | ORAL | 0 refills | Status: AC

## 2022-02-28 NOTE — Patient Instructions (Addendum)
Stop the ampicillin (I will reach out to your GYN about urine culture) Start on doxycycline Monitor marked area on arm closely, call if any worsening symptoms or fever / chills develop

## 2022-02-28 NOTE — Progress Notes (Signed)
Subjective:    Patient ID: Melinda [name], female    DOB: 05-Jul-1951, 71 y.o.   MRN: [id number]  Chief Complaint  Patient presents with   Insect Bite    Pt states spider bite 1 wk ago turned into rash; redness to the area and warm to touch; getting bigger. 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 States she has been cleaning out the garage, sometime after noticed small red area on R forearm. It has grown in size over the last week. Warm to touch. No drainage, no fever or chills. Currently taking ampicillin from her GYN for ?urinary frequency ?UTI. No urine culture report in chart. Feeling better, urinary symptoms resolved. Finished 5 days of ampicillin so far.  Past Medical History:  Diagnosis Date   Arthritis    Back problem    Cataracts, bilateral    Chicken pox    Disorder of adrenal gland ([place])    Disorders of sulfur-bearing amino-acid metabolism, unspecified ([place])    Displaced fracture of base of unspecified metacarpal bone, initial encounter for closed fracture    Gallstones    Hyperlipidemia    Hypertension    Insomnia    Iron deficiency anemia, unspecified    mostly vegetarian   Measles    Mumps    Sciatica    Sensitivity to medication    Sprain of unspecified ligament of right ankle, subsequent encounter    Vitamin D deficiency, unspecified     No past surgical history on file.  Family History  Problem Relation Age of Onset   Hypertension Mother    Hypothyroidism Mother    Heart failure Mother    Stroke Mother    Bone cancer Father    Heart disease Maternal Grandfather 61   Hypertension Maternal Grandfather    Glaucoma Maternal Grandmother    Mental illness Paternal Grandmother    Diabetes Paternal Grandfather    Angina Paternal Grandfather    Heart disease Brother    Hypertension  Brother    Melanoma Paternal Uncle 58   Mental illness Paternal Aunt    Autoimmune disease Maternal Uncle    Prostate cancer Maternal Uncle    Kidney Stones Maternal Uncle    Stroke Maternal Uncle    Parkinson's disease Maternal Aunt    Hypertension Maternal Aunt    Glaucoma Maternal Aunt    Parkinson's disease Brother     Social History   Tobacco Use   Smoking status: Never   Smokeless tobacco: Never   Tobacco comments:    Never smoke 12/28/21  Substance Use Topics   Alcohol use: Yes    Alcohol/week: 1.0 standard drink of alcohol    Types: 1 Standard drinks or equivalent per week    Comment: very small amount occasionally 09/29/21   Drug use: No     Allergies  Allergen Reactions   Pseudoephedrine Other (See Comments)    Hallucination     Review of Systems NEGATIVE UNLESS OTHERWISE INDICATED IN HPI      Objective:     BP 136/74 (BP Location: Left Arm)   Pulse 70   Temp 98.6 F (37 C) (Temporal)   Ht '5\' 7"'$  (1.702 m)   Wt 162 lb 9.6 oz (73.8 kg)   LMP  08/04/2009   SpO2 98%   BMI 25.47 kg/m   Wt Readings from Last 3 Encounters:  02/28/22 162 lb 9.6 oz (73.8 kg)  02/16/22 162 lb 8 oz (73.7 kg)  12/28/21 161 lb 3.2 oz (73.1 kg)    BP Readings from Last 3 Encounters:  02/28/22 136/74  02/16/22 (!) 159/82  12/28/21 140/80     Physical Exam Constitutional:      Appearance: Normal appearance.  Skin:      Neurological:     Mental Status: She is alert.        Assessment & Plan:   Problem List Items Addressed This Visit   None Visit Diagnoses     Cellulitis of right arm    -  Primary   Urinary frequency            Meds ordered this encounter  Medications   doxycycline (VIBRA-TABS) 100 MG tablet    Sig: Take 1 tablet (100 mg total) by mouth 2 (two) times daily for 7 days.    Dispense:  14 tablet    Refill:  0    Order Specific Question:   Supervising Provider    Answer:   Marin Olp [[id number]]    PLAN: Stop the ampicillin (I  will reach out to your GYN about urine culture) Start on doxycycline as directed. Pt aware of risks vs benefits and possible adverse reactions. Monitor marked area on arm closely, call if any worsening symptoms or fever / chills develop   Mykaila Blunck M Dupree Givler, PA-C

## 2022-03-07 ENCOUNTER — Telehealth: Payer: Self-pay | Admitting: Physician Assistant

## 2022-03-07 NOTE — Telephone Encounter (Signed)
Copied from [place] [id number]. Topic: Medicare AWV >> Mar 07, 2022 10:05 AM Devoria Glassing wrote: Reason for CRM: Left message for patient to schedule Annual Wellness Visit.  Please schedule with Nurse Health Advisor Charlott Rakes, RN at [facility]. This appt can be telephone or office visit. Please call [phone] ask for [facility]

## 2022-03-10 ENCOUNTER — Encounter: Payer: Self-pay | Admitting: Physician Assistant

## 2022-03-25 DIAGNOSIS — Z0100 Encounter for examination of eyes and vision without abnormal findings: Secondary | ICD-10-CM | POA: Diagnosis not present

## 2022-05-09 ENCOUNTER — Telehealth: Payer: Self-pay | Admitting: Physician Assistant

## 2022-05-09 ENCOUNTER — Other Ambulatory Visit: Payer: Self-pay | Admitting: Physician Assistant

## 2022-05-09 DIAGNOSIS — E876 Hypokalemia: Secondary | ICD-10-CM

## 2022-05-09 NOTE — Telephone Encounter (Signed)
Patient states: -Went to acupuncturist, Reinaldo Raddle, for her right arm, back, and shoulder pain - [place] [facility] recommended she get into PT to build up body strength since this is source of pain  Patient requests: - A referral to [facility] for PT   I offered pt an OV but she declined.

## 2022-05-09 NOTE — Telephone Encounter (Signed)
Pt will need to schedule office visit with Aldona Bar to discuss pain she is having in order for [place] to order physical therapy.

## 2022-05-13 ENCOUNTER — Ambulatory Visit (INDEPENDENT_AMBULATORY_CARE_PROVIDER_SITE_OTHER): Payer: Medicare HMO | Admitting: Physician Assistant

## 2022-05-13 ENCOUNTER — Encounter: Payer: Self-pay | Admitting: Physician Assistant

## 2022-05-13 VITALS — BP 124/70 | HR 82 | Temp 97.5°F | Ht 67.0 in | Wt 165.0 lb

## 2022-05-13 DIAGNOSIS — I4891 Unspecified atrial fibrillation: Secondary | ICD-10-CM | POA: Diagnosis not present

## 2022-05-13 DIAGNOSIS — L821 Other seborrheic keratosis: Secondary | ICD-10-CM | POA: Diagnosis not present

## 2022-05-13 DIAGNOSIS — M79601 Pain in right arm: Secondary | ICD-10-CM | POA: Diagnosis not present

## 2022-05-13 NOTE — Patient Instructions (Signed)
It was great to see you!  Dermatology referral    Take care,  Inda Coke PA-C

## 2022-05-13 NOTE — Progress Notes (Signed)
Melinda [name] is a 71 y.o. female here for a follow up of a pre-existing problem.  History of Present Illness:   Chief Complaint  Patient presents with   Bicep pain    Pt c/o right bicep pain, had acupuncture therapy. Pt would like a referral for Physical Therapy    HPI  R arm pain Had some issues with her R arm Acupuncturist stated that her bicep tendon seems inflamed.  Had significant improvement of symptoms after he did some cupping. Symptoms are returning. 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 Has had issues with this in the past. Will take tylenol as needed for this.  A-fib Triggered in the middle of the night when she turns onto her left side  Has happened a total of 3 times since 2017 Having some episodes of prolonged atrial fibrillation and had to take diltiazem 30 mg prn for this  Seborrheic keratosis Has put colloidal silver on this and it has helped Picking at them to the point where they bleed   Past Medical History:  Diagnosis Date   Arthritis    Back problem    Cataracts, bilateral    Chicken pox    Disorder of adrenal gland ([place])    Disorders of sulfur-bearing amino-acid metabolism, unspecified ([place])    Displaced fracture of base of unspecified metacarpal bone, initial encounter for closed fracture    Gallstones    Hyperlipidemia    Hypertension    Insomnia    Iron deficiency anemia, unspecified    mostly vegetarian   Measles    Mumps    Sciatica    Sensitivity to medication    Sprain of unspecified ligament of right ankle, subsequent encounter    Vitamin D deficiency, unspecified      Social History   Tobacco Use   Smoking status: Never   Smokeless tobacco: Never   Tobacco comments:    Never smoke 12/28/21  Substance Use Topics   Alcohol use: Yes    Alcohol/week: 1.0 standard drink of alcohol    Types: 1 Standard drinks or equivalent per week     Comment: very small amount occasionally 09/29/21   Drug use: No    History reviewed. No pertinent surgical history.  Family History  Problem Relation Age of Onset   Hypertension Mother    Hypothyroidism Mother    Heart failure Mother    Stroke Mother    Bone cancer Father    Heart disease Maternal Grandfather 35   Hypertension Maternal Grandfather    Glaucoma Maternal Grandmother    Mental illness Paternal Grandmother    Diabetes Paternal Grandfather    Angina Paternal Grandfather    Heart disease Brother    Hypertension Brother    Melanoma Paternal Uncle 92   Mental illness Paternal Aunt    Autoimmune disease Maternal Uncle    Prostate cancer Maternal Uncle    Kidney Stones Maternal Uncle    Stroke Maternal Uncle    Parkinson's disease Maternal Aunt    Hypertension Maternal Aunt    Glaucoma Maternal Aunt    Parkinson's disease Brother     Allergies  Allergen Reactions   Pseudoephedrine Other (See Comments)    Hallucination     Current Medications:   Current Outpatient Medications:    Acetylcysteine (NAC) 600 MG CAPS, Take 2 capsules by mouth daily in the afternoon.,  Disp: , Rfl:    Ascorbic Acid (VITAMIN C PO), Take 1 tablet by mouth daily in the afternoon. Vitamin C and Bio-Quercetin Phytosome, Disp: , Rfl:    Cholecalciferol 1.25 MG (50000 UT) TABS, Take 1 tablet by mouth once a week., Disp: , Rfl:    Digestive Enzymes (DIGESTIVE ENZYME PO), Take 1 tablet by mouth daily in the afternoon. Primary Digest- Brand, Disp: , Rfl:    diltiazem (CARDIZEM CD) 120 MG 24 hr capsule, Take 1 capsule by mouth once daily, Disp: 30 capsule, Rfl: 6   diltiazem (CARDIZEM) 30 MG tablet, Take 1 tablet every 4 hours AS NEEDED for heart rate >100, Disp: 30 tablet, Rfl: 1   GLYCINE PO, Take 500 mg by mouth. Take 4 tabs at bedtime on an empty stomach or open and melt . May repeat in the middle of the night if needed, Disp: , Rfl:    Iodine Strong, Lugols, (IODINE STRONG PO), Take 400 mcg  by mouth., Disp: , Rfl:    magnesium gluconate (MAGONATE) 500 MG tablet, Take 500 mg by mouth daily in the afternoon., Disp: , Rfl:    MAGNESIUM GLYCINATE PO, Take 1 tablet by mouth daily., Disp: , Rfl:    Multiple Vitamins-Minerals (MULTIVITAMIN WOMEN PO), Take 1 tablet by mouth daily in the afternoon. Vitamin Code 50 and wiser, Disp: , Rfl:    NON FORMULARY, P 5 P 50 mg  -Taking 1 tablet by mouth once a day, Disp: , Rfl:    Nutritional Supplements (DETOX ENZYME FORMULA PO), Take by mouth. Reported on 01/21/2016, Disp: , Rfl:    OVER THE COUNTER MEDICATION, Take 4 tablets by mouth daily., Disp: , Rfl:    OVER THE COUNTER MEDICATION, Take 1 capsule by mouth daily in the afternoon. Unique E-, Disp: , Rfl:    OVER THE COUNTER MEDICATION, Take 1 tablet by mouth daily in the afternoon. MacuGuard Ocular Support, Disp: , Rfl:    OVER THE COUNTER MEDICATION, Take 1 tablet by mouth daily in the afternoon. LV-GB Complex, Disp: , Rfl:    OVER THE COUNTER MEDICATION, Take 1 tablet by mouth daily. Hair Skin and Nails, Disp: , Rfl:    potassium chloride (KLOR-CON) 10 MEQ tablet, Take 1 tablet by mouth twice daily (Patient taking differently: daily.), Disp: 60 tablet, Rfl: 0   QUERCETIN PO, Take 1 tablet by mouth daily in the afternoon., Disp: , Rfl:    UNABLE TO FIND, CBD oil '1000mg'$ , Disp: , Rfl:    VITAMIN K PO, Take 1 tablet by mouth daily. Potassium, Disp: , Rfl:    XARELTO 20 MG TABS tablet, TAKE 1 TABLET BY MOUTH ONCE DAILY WITH SUPPER, Disp: 30 tablet, Rfl: 6   Review of Systems:   ROS Negative unless otherwise specified per HPI.  Vitals:   Vitals:   05/13/22 1444  BP: 124/70  Pulse: 82  Temp: (!) 97.5 F (36.4 C)  TempSrc: Temporal  SpO2: 95%  Weight: 165 lb (74.8 kg)  Height: '5\' 7"'$  (1.702 m)     Body mass index is 25.84 kg/m.  Physical Exam:   Physical Exam Vitals and nursing note reviewed.  Constitutional:      General: She is not in acute distress.    Appearance: She is  well-developed. She is not ill-appearing or toxic-appearing.  Cardiovascular:     Rate and Rhythm: Normal rate and regular rhythm.     Pulses: Normal pulses.     Heart sounds: Normal heart sounds, S1 normal  and S2 normal.  Pulmonary:     Effort: Pulmonary effort is normal.     Breath sounds: Normal breath sounds.  Skin:    General: Skin is warm and dry.  Neurological:     Mental Status: She is alert.     GCS: GCS eye subscore is 4. GCS verbal subscore is 5. GCS motor subscore is 6.  Psychiatric:        Speech: Speech normal.        Behavior: Behavior normal. Behavior is cooperative.     Assessment and Plan:   Right arm pain Referral to PT She is requesting PT at [facility] [address] Consider sports medicine if needed  Atrial fibrillation, unspecified type [facility]) Currently in rhythm Continue use of apple watch Recommended close follow-up with cardiology -- due for return to A Fib clinic in 2 months  Seborrheic keratosis Referral to dermatology  Inda Coke, PA-C

## 2022-05-19 ENCOUNTER — Encounter: Payer: Self-pay | Admitting: Physician Assistant

## 2022-05-20 ENCOUNTER — Emergency Department (HOSPITAL_BASED_OUTPATIENT_CLINIC_OR_DEPARTMENT_OTHER)
Admission: EM | Admit: 2022-05-20 | Discharge: 2022-05-20 | Disposition: A | Discharge status: Home / Self Care | Payer: No Typology Code available for payment source | Attending: Emergency Medicine | Admitting: Emergency Medicine

## 2022-05-20 ENCOUNTER — Other Ambulatory Visit: Payer: Self-pay

## 2022-05-20 ENCOUNTER — Encounter: Payer: Self-pay | Admitting: Physician Assistant

## 2022-05-20 ENCOUNTER — Encounter (HOSPITAL_BASED_OUTPATIENT_CLINIC_OR_DEPARTMENT_OTHER): Payer: Self-pay

## 2022-05-20 ENCOUNTER — Emergency Department (HOSPITAL_BASED_OUTPATIENT_CLINIC_OR_DEPARTMENT_OTHER): Payer: No Typology Code available for payment source | Admitting: Radiology

## 2022-05-20 DIAGNOSIS — M79602 Pain in left arm: Secondary | ICD-10-CM | POA: Insufficient documentation

## 2022-05-20 DIAGNOSIS — I480 Paroxysmal atrial fibrillation: Secondary | ICD-10-CM | POA: Insufficient documentation

## 2022-05-20 DIAGNOSIS — Z7901 Long term (current) use of anticoagulants: Secondary | ICD-10-CM | POA: Diagnosis not present

## 2022-05-20 DIAGNOSIS — M7989 Other specified soft tissue disorders: Secondary | ICD-10-CM | POA: Diagnosis not present

## 2022-05-20 DIAGNOSIS — W010XXA Fall on same level from slipping, tripping and stumbling without subsequent striking against object, initial encounter: Secondary | ICD-10-CM | POA: Insufficient documentation

## 2022-05-20 MED ORDER — ACETAMINOPHEN 325 MG PO TABS
650.0000 mg | ORAL_TABLET | Freq: Once | ORAL | Status: AC
  Administered 2022-05-20: 650 mg via ORAL
  Filled 2022-05-20: qty 2

## 2022-05-20 NOTE — ED Triage Notes (Signed)
Patient here POV from Home.  Endorses not noting a shift in the [place] and tripping. Injured her Left Forearm/Wrist/hand in the process.   No head Injury. No LOC.   NAD Noted during Triage. A&Ox4. GCS 15. Ambulatory.

## 2022-05-20 NOTE — ED Provider Notes (Signed)
[facility] EMERGENCY DEPT Provider Note   CSN: [id number] Arrival date & time: 05/20/22  1275     History  Chief Complaint  Patient presents with   Arm Injury    Melinda [name] is a 71 y.o. female presenting after a fall.  Reports that she tripped and fell onto her left arm.  Pain to this entire extremity.  History of paroxysmal A-fib anticoagulated and well controlled.  No history of seizures or syncope   Arm Injury      Home Medications Prior to Admission medications   Medication Sig Start Date End Date Taking? Authorizing Provider  Acetylcysteine (NAC) 600 MG CAPS Take 2 capsules by mouth daily in the afternoon.    [provider]  Ascorbic Acid (VITAMIN C PO) Take 1 tablet by mouth daily in the afternoon. Vitamin C and Bio-Quercetin Phytosome    [provider]  Cholecalciferol 1.25 MG (50000 UT) TABS Take 1 tablet by mouth once a week.    [provider]  Digestive Enzymes (DIGESTIVE ENZYME PO) Take 1 tablet by mouth daily in the afternoon. Primary Digest- Brand    [provider]  diltiazem (CARDIZEM CD) 120 MG 24 hr capsule Take 1 capsule by mouth once daily 10/25/21   Fenton, Clint R, PA  diltiazem (CARDIZEM) 30 MG tablet Take 1 tablet every 4 hours AS NEEDED for heart rate >100 09/29/21   Fenton, Clint R, PA  GLYCINE PO Take 500 mg by mouth. Take 4 tabs at bedtime on an empty stomach or open and melt . May repeat in the middle of the night if needed    [provider]  Iodine Strong, Lugols, (IODINE STRONG PO) Take 400 mcg by mouth.    [provider]  magnesium gluconate (MAGONATE) 500 MG tablet Take 500 mg by mouth daily in the afternoon.    [provider]  MAGNESIUM GLYCINATE PO Take 1 tablet by mouth daily.    [provider]  Multiple Vitamins-Minerals (MULTIVITAMIN WOMEN PO) Take 1 tablet by mouth daily in the afternoon. Vitamin Code 50 and wiser    [provider]  NON  FORMULARY P 5 P 50 mg  -Taking 1 tablet by mouth once a day    [provider]  Nutritional Supplements (DETOX ENZYME FORMULA PO) Take by mouth. Reported on 01/21/2016    [provider]  OVER THE COUNTER MEDICATION Take 4 tablets by mouth daily.    [provider]  OVER THE COUNTER MEDICATION Take 1 capsule by mouth daily in the afternoon. Unique E-    [provider]  OVER THE COUNTER MEDICATION Take 1 tablet by mouth daily in the afternoon. MacuGuard Ocular Support    [provider]  OVER THE COUNTER MEDICATION Take 1 tablet by mouth daily in the afternoon. LV-GB Complex    [provider]  OVER THE COUNTER MEDICATION Take 1 tablet by mouth daily. Hair Skin and Nails    [provider]  potassium chloride (KLOR-CON) 10 MEQ tablet Take 1 tablet by mouth twice daily Patient taking differently: daily. 05/09/22   Inda Coke, PA  QUERCETIN PO Take 1 tablet by mouth daily in the afternoon.    [provider]  UNABLE TO FIND CBD oil '1000mg'$     [provider]  VITAMIN K PO Take 1 tablet by mouth daily. Potassium    [provider]  XARELTO 20 MG TABS tablet TAKE 1 TABLET BY MOUTH ONCE DAILY  WITH SUPPER 10/25/21   Fenton, Clint R, PA      Allergies    Pseudoephedrine    Review of Systems   Review of Systems  Physical Exam Updated Vital Signs BP (!) 203/97 (BP Location: Right Arm)   Pulse 83   Temp 98.6 F (37 C)   Resp 15   Ht '5\' 7"'$  (1.702 m)   Wt 74.8 kg   LMP 08/04/2009   SpO2 98%   BMI 25.83 kg/m  Physical Exam Vitals and nursing note reviewed.  Constitutional:      Appearance: Normal appearance.  HENT:     Head: Normocephalic and atraumatic.  Eyes:     General: No scleral icterus.    Conjunctiva/sclera: Conjunctivae normal.  Pulmonary:     Effort: Pulmonary effort is normal. No respiratory distress.  Musculoskeletal:     Comments: Full range of motion of left upper extremity.   Normal strength to finger grip.  Some swelling on the dorsal surface of her wrist.  No lacerations.  Normal elbow and shoulder  Skin:    Findings: No rash.  Neurological:     Mental Status: She is alert.  Psychiatric:        Mood and Affect: Mood normal.     ED Results / Procedures / Treatments   Labs (all labs ordered are listed, but only abnormal results are displayed) Labs Reviewed - No data to display  EKG None  Radiology DG Forearm Left  Result Date: 05/20/2022 CLINICAL DATA:  Fall with injury to left forearm, wrist, and hand. EXAM: LEFT FOREARM - 2 VIEW; LEFT HAND - COMPLETE 3+ VIEW; LEFT WRIST - COMPLETE 3+ VIEW COMPARISON:  None Available. FINDINGS: Left hand: No acute fracture or dislocation. Degenerative changes are present at the interphalangeal joints. Mild soft tissue swelling is present over the metacarpal phalangeal joints. Left wrist: No acute fracture or dislocation. Joint spaces maintained at the wrist. The soft tissues are within normal limits. Left forearm: No acute fracture or dislocation. The soft tissues are unremarkable. IMPRESSION: No acute fracture or dislocation. Electronically Signed   By: Brett Fairy M.D.   On: 05/20/2022 20:16   DG Wrist Complete Left  Result Date: 05/20/2022 CLINICAL DATA:  Fall with injury to left forearm, wrist, and hand. EXAM: LEFT FOREARM - 2 VIEW; LEFT HAND - COMPLETE 3+ VIEW; LEFT WRIST - COMPLETE 3+ VIEW COMPARISON:  None Available. FINDINGS: Left hand: No acute fracture or dislocation. Degenerative changes are present at the interphalangeal joints. Mild soft tissue swelling is present over the metacarpal phalangeal joints. Left wrist: No acute fracture or dislocation. Joint spaces maintained at the wrist. The soft tissues are within normal limits. Left forearm: No acute fracture or dislocation. The soft tissues are unremarkable. IMPRESSION: No acute fracture or dislocation. Electronically Signed   By: Brett Fairy M.D.   On:  05/20/2022 20:16   DG Hand Complete Left  Result Date: 05/20/2022 CLINICAL DATA:  Fall with injury to left forearm, wrist, and hand. EXAM: LEFT FOREARM - 2 VIEW; LEFT HAND - COMPLETE 3+ VIEW; LEFT WRIST - COMPLETE 3+ VIEW COMPARISON:  None Available. FINDINGS: Left hand: No acute fracture or dislocation. Degenerative changes are present at the interphalangeal joints. Mild soft tissue swelling is present over the metacarpal phalangeal joints. Left wrist: No acute fracture or dislocation. Joint spaces maintained at the wrist. The soft tissues are within normal limits. Left forearm: No acute fracture or dislocation. The soft tissues are unremarkable. IMPRESSION: No acute  fracture or dislocation. Electronically Signed   By: Brett Fairy M.D.   On: 05/20/2022 20:16    Procedures Procedures   Medications Ordered in ED Medications  acetaminophen (TYLENOL) tablet 650 mg (has no administration in time range)    ED Course/ Medical Decision Making/ A&P                           Medical Decision Making Amount and/or Complexity of Data Reviewed Radiology: ordered.  Risk OTC drugs.   71 year old female presenting after a fall onto her left upper extremity.  She is on blood thinners but did not hit her head.  She is positive that this was a mechanical fall and not stasis, seizure, syncope or other etiology of the fall.  Physical exam with some inflammation to the left wrist however otherwise benign.  X-rays were ordered in triage and viewed and interpreted by me.  I agree with radiology that there is no sign of any fractures, dislocations or other findings.  Treated with extra strength Tylenol and discharged home with rice therapy and a wrist fracture.  She, her daughter and husband are agreeable to the plan.  Will follow up with PCP in 1 to 2 weeks if things are not improving.   Final Clinical Impression(s) / ED Diagnoses Final diagnoses:  Left arm pain    Rx / DC Orders ED Discharge Orders      None      Results and diagnoses were explained to the patient. Return precautions discussed in full. Patient had no additional questions and expressed complete understanding.   This chart was dictated using voice recognition software.  Despite best efforts to proofread,  errors can occur which can change the documentation meaning.    Darliss Ridgel 05/20/22 2206    Davonna Belling, MD 05/21/22 [phone]

## 2022-05-20 NOTE — Discharge Instructions (Addendum)
You do not have a fracture in your arm.  Ice your arm in 20-minute intervals and take Tylenol for your pain and swelling.  Also use a brace for support and discomfort.  Ultimately, follow-up with your PCP in 1 to 2 weeks if is not getting any better.  Return to the emergency department with any worsening symptoms.

## 2022-05-20 NOTE — ED Notes (Signed)
Pt awake and alert lying in bed; no acute distress -- portable xray in progress

## 2022-05-20 NOTE — ED Notes (Signed)
Pt sitting up to edge of bed with visitors at bedside-- guarding L wrist.  L wrist mild edema noted; skin intact; limited LUE wrist ROM due to pain.  Cap refill <2 seconds; no paresthesia complaint.  L wrist brace applied by this nurse - well tolerated by patient -- pt reports pain improvement with movement s/p wrist application.  Pt is agreeable with d/c plan as discussed by provider- this nurse has verbally reinforced d/c instructions and provided pt with written copy-pt acknowledges verbal understanding and denies any addl questions, concerns, needs- pt ambulatory at d/c independently with steady gait accompanied by spouse and daughter

## 2022-05-24 ENCOUNTER — Telehealth: Payer: Self-pay

## 2022-05-24 NOTE — Telephone Encounter (Signed)
   Reason for call: ED-Follow up call   Patient visit on 05/20/2022  at [facility] was for arm injury  Have you been able to follow up with your primary care physician? - Appt Scheduled  The patient was or was not able to obtain any needed medicine or equipment. - Was, Yes  Are there diet recommendations that you are having difficulty following? - No  Patient expresses understanding of discharge instructions and education provided has no other needs at this time.   [facility]  [place], [place]  Main Phone: [phone]  E-mail: [email or web address]  Website: [email or web address]

## 2022-05-28 ENCOUNTER — Other Ambulatory Visit (HOSPITAL_COMMUNITY): Payer: Self-pay | Admitting: Physician Assistant

## 2022-05-30 DIAGNOSIS — M79601 Pain in right arm: Secondary | ICD-10-CM | POA: Diagnosis not present

## 2022-06-01 ENCOUNTER — Telehealth: Payer: Self-pay | Admitting: Pharmacist

## 2022-06-01 NOTE — Telephone Encounter (Signed)
PA for Xarelto submitted. Key: [id number].  The patient currently has access to the requested medication and a Prior Authorization is not needed for the patient/medication.

## 2022-06-02 ENCOUNTER — Ambulatory Visit: Payer: Medicare HMO | Admitting: Physician Assistant

## 2022-06-02 ENCOUNTER — Other Ambulatory Visit: Payer: Self-pay | Admitting: Physician Assistant

## 2022-06-02 DIAGNOSIS — E876 Hypokalemia: Secondary | ICD-10-CM

## 2022-06-05 ENCOUNTER — Encounter (HOSPITAL_BASED_OUTPATIENT_CLINIC_OR_DEPARTMENT_OTHER): Payer: Medicare HMO | Admitting: Cardiovascular Disease

## 2022-06-16 DIAGNOSIS — M79601 Pain in right arm: Secondary | ICD-10-CM | POA: Diagnosis not present

## 2022-06-17 ENCOUNTER — Ambulatory Visit (INDEPENDENT_AMBULATORY_CARE_PROVIDER_SITE_OTHER): Payer: Medicare HMO

## 2022-06-17 VITALS — Wt 164.0 lb

## 2022-06-17 DIAGNOSIS — Z0000 Encounter for general adult medical examination without abnormal findings: Secondary | ICD-10-CM

## 2022-06-17 NOTE — Patient Instructions (Signed)
Melinda [name] , Thank you for taking time to come for your Medicare Wellness Visit. I appreciate your ongoing commitment to your health goals. Please review the following plan we discussed and let me know if I can assist you in the future.   These are the goals we discussed:  Goals      Eat more fruits and vegetables     Increase physical activity     Patient Stated     Lose weight and walk more         This is a list of the screening recommended for you and due dates:  Health Maintenance  Topic Date Due   Mammogram  09/15/2022*   DEXA scan (bone density measurement)  09/15/2022*   Colon Cancer Screening  03/01/2023*   Medicare Annual Wellness Visit  06/18/2023   Tetanus Vaccine  12/18/2031   Hepatitis C Screening: USPSTF Recommendation to screen - Ages 18-79 yo.  Completed   HPV Vaccine  Aged Out   Pneumonia Vaccine  Discontinued   Flu Shot  Discontinued   COVID-19 Vaccine  Discontinued   Zoster (Shingles) Vaccine  Discontinued  *Topic was postponed. The date shown is not the original due date.    Advanced directives: Please bring a copy of your health care power of attorney and living will to the office at your convenience.  Conditions/risks identified: lose some weight and walk more   Next appointment: Follow up in one year for your annual wellness visit    Preventive Care 65 Years and Older, Female Preventive care refers to lifestyle choices and visits with your health care provider that can promote health and wellness. What does preventive care include? A yearly physical exam. This is also called an annual well check. Dental exams once or twice a year. Routine eye exams. Ask your health care provider how often you should have your eyes checked. Personal lifestyle choices, including: Daily care of your teeth and gums. Regular physical activity. Eating a healthy diet. Avoiding tobacco and drug use. Limiting alcohol use. Practicing safe sex. Taking low-dose aspirin  every day. Taking vitamin and mineral supplements as recommended by your health care provider. What happens during an annual well check? The services and screenings done by your health care provider during your annual well check will depend on your age, overall health, lifestyle risk factors, and family history of disease. Counseling  Your health care provider may ask you questions about your: Alcohol use. Tobacco use. Drug use. Emotional well-being. Home and relationship well-being. Sexual activity. Eating habits. History of falls. Memory and ability to understand (cognition). Work and work Statistician. Reproductive health. Screening  You may have the following tests or measurements: Height, weight, and BMI. Blood pressure. Lipid and cholesterol levels. These may be checked every 5 years, or more frequently if you are over 15 years old. Skin check. Lung cancer screening. You may have this screening every year starting at age 46 if you have a 30-pack-year history of smoking and currently smoke or have quit within the past 15 years. Fecal occult blood test (FOBT) of the stool. You may have this test every year starting at age 19. Flexible sigmoidoscopy or colonoscopy. You may have a sigmoidoscopy every 5 years or a colonoscopy every 10 years starting at age 26. Hepatitis C blood test. Hepatitis B blood test. Sexually transmitted disease (STD) testing. Diabetes screening. This is done by checking your blood sugar (glucose) after you have not eaten for a while (fasting). You may  have this done every 1-3 years. Bone density scan. This is done to screen for osteoporosis. You may have this done starting at age 36. Mammogram. This may be done every 1-2 years. Talk to your health care provider about how often you should have regular mammograms. Talk with your health care provider about your test results, treatment options, and if necessary, the need for more tests. Vaccines  Your health  care provider may recommend certain vaccines, such as: Influenza vaccine. This is recommended every year. Tetanus, diphtheria, and acellular pertussis (Tdap, Td) vaccine. You may need a Td booster every 10 years. Zoster vaccine. You may need this after age 14. Pneumococcal 13-valent conjugate (PCV13) vaccine. One dose is recommended after age 76. Pneumococcal polysaccharide (PPSV23) vaccine. One dose is recommended after age 71. Talk to your health care provider about which screenings and vaccines you need and how often you need them. This information is not intended to replace advice given to you by your health care provider. Make sure you discuss any questions you have with your health care provider. Document Released: 08/21/2015 Document Revised: 04/13/2016 Document Reviewed: 05/26/2015 Elsevier Interactive Patient Education  2017 [place] Prevention in the Home Falls can cause injuries. They can happen to people of all ages. There are many things you can do to make your home safe and to help prevent falls. What can I do on the outside of my home? Regularly fix the edges of walkways and driveways and fix any cracks. Remove anything that might make you trip as you walk through a door, such as a raised step or threshold. Trim any bushes or trees on the path to your home. Use bright outdoor lighting. Clear any walking paths of anything that might make someone trip, such as rocks or tools. Regularly check to see if handrails are loose or broken. Make sure that both sides of any steps have handrails. Any raised decks and porches should have guardrails on the edges. Have any leaves, snow, or ice cleared regularly. Use sand or salt on walking paths during winter. Clean up any spills in your garage right away. This includes oil or grease spills. What can I do in the bathroom? Use night lights. Install grab bars by the toilet and in the tub and shower. Do not use towel bars as grab  bars. Use non-skid mats or decals in the tub or shower. If you need to sit down in the shower, use a plastic, non-slip stool. Keep the floor dry. Clean up any water that spills on the floor as soon as it happens. Remove soap buildup in the tub or shower regularly. Attach bath mats securely with double-sided non-slip rug tape. Do not have throw rugs and other things on the floor that can make you trip. What can I do in the bedroom? Use night lights. Make sure that you have a light by your bed that is easy to reach. Do not use any sheets or blankets that are too big for your bed. They should not hang down onto the floor. Have a firm chair that has side arms. You can use this for support while you get dressed. Do not have throw rugs and other things on the floor that can make you trip. What can I do in the kitchen? Clean up any spills right away. Avoid walking on wet floors. Keep items that you use a lot in easy-to-reach places. If you need to reach something above you, use a strong step  stool that has a grab bar. Keep electrical cords out of the way. Do not use floor polish or wax that makes floors slippery. If you must use wax, use non-skid floor wax. Do not have throw rugs and other things on the floor that can make you trip. What can I do with my stairs? Do not leave any items on the stairs. Make sure that there are handrails on both sides of the stairs and use them. Fix handrails that are broken or loose. Make sure that handrails are as long as the stairways. Check any carpeting to make sure that it is firmly attached to the stairs. Fix any carpet that is loose or worn. Avoid having throw rugs at the top or bottom of the stairs. If you do have throw rugs, attach them to the floor with carpet tape. Make sure that you have a light switch at the top of the stairs and the bottom of the stairs. If you do not have them, ask someone to add them for you. What else can I do to help prevent  falls? Wear shoes that: Do not have high heels. Have rubber bottoms. Are comfortable and fit you well. Are closed at the toe. Do not wear sandals. If you use a stepladder: Make sure that it is fully opened. Do not climb a closed stepladder. Make sure that both sides of the stepladder are locked into place. Ask someone to hold it for you, if possible. Clearly mark and make sure that you can see: Any grab bars or handrails. First and last steps. Where the edge of each step is. Use tools that help you move around (mobility aids) if they are needed. These include: Canes. Walkers. Scooters. Crutches. Turn on the lights when you go into a dark area. Replace any light bulbs as soon as they burn out. Set up your furniture so you have a clear path. Avoid moving your furniture around. If any of your floors are uneven, fix them. If there are any pets around you, be aware of where they are. Review your medicines with your doctor. Some medicines can make you feel dizzy. This can increase your chance of falling. Ask your doctor what other things that you can do to help prevent falls. This information is not intended to replace advice given to you by your health care provider. Make sure you discuss any questions you have with your health care provider. Document Released: 05/21/2009 Document Revised: 12/31/2015 Document Reviewed: 08/29/2014 Elsevier Interactive Patient Education  2017 [employer].

## 2022-06-17 NOTE — Progress Notes (Signed)
I connected with  [name] on 06/17/22 by a audio enabled telemedicine application and verified that I am speaking with the correct person using two identifiers.  Patient Location: Home  Provider Location: Office/Clinic  I discussed the limitations of evaluation and management by telemedicine. The patient expressed understanding and agreed to proceed.   Subjective:   Melinda [name] is a 71 y.o. female who presents for an Initial Medicare Annual Wellness Visit.  Review of Systems     Cardiac Risk Factors include: advanced age (>19mn, >>3women)     Objective:    Today's Vitals   06/17/22 1203  Weight: 164 lb (74.4 kg)   Body mass index is 25.69 kg/m.     06/17/2022   12:10 PM 05/20/2022    7:41 PM 02/04/2021    5:33 PM 02/12/2019   10:02 AM  Advanced Directives  Does Patient Have a Medical Advance Directive? Yes No No No  Type of [place]     Copy of [place] Chart? No - copy requested     Would patient like information on creating a medical advance directive?  No - Patient declined  No - Patient declined    Current Medications (verified) Outpatient Encounter Medications as of 06/17/2022  Medication Sig   Acetylcysteine (NAC) 600 MG CAPS Take 2 capsules by mouth daily in the afternoon.   Ascorbic Acid (VITAMIN C PO) Take 1 tablet by mouth daily in the afternoon. Vitamin C and Bio-Quercetin Phytosome   Cholecalciferol 1.25 MG (50000 UT) TABS Take 1 tablet by mouth once a week.   Digestive Enzymes (DIGESTIVE ENZYME PO) Take 1 tablet by mouth daily in the afternoon. Primary Digest- Brand   diltiazem (CARDIZEM CD) 120 MG 24 hr capsule Take 1 capsule by mouth once daily   GLYCINE PO Take 500 mg by mouth. Take 4 tabs at bedtime on an empty stomach or open and melt . May repeat in the middle of the night if needed   Iodine Strong, Lug[place], (IODINE STRONG PO) Take 400 mcg by mouth.   magnesium  gluconate (MAGONATE) 500 MG tablet Take 500 mg by mouth daily in the afternoon.   MAGNESIUM GLYCINATE PO Take 1 tablet by mouth daily.   Multiple Vitamins-Minerals (MULTIVITAMIN WOMEN PO) Take 1 tablet by mouth daily in the afternoon. Vitamin Code 50 and wiser   NON FORMULARY P 5 P 50 mg  -Taking 1 tablet by mouth once a day   Nutritional Supplements (DETOX ENZYME FORMULA PO) Take by mouth. Reported on 01/21/2016   OVER THE COUNTER MEDICATION Take 4 tablets by mouth daily.   OVER THE COUNTER MEDICATION Take 1 capsule by mouth daily in the afternoon. Unique E-   OVER THE COUNTER MEDICATION Take 1 tablet by mouth daily in the afternoon. MacuGuard Ocular Support   OVER THE COUNTER MEDICATION Take 1 tablet by mouth daily in the afternoon. LV-GB Complex   OVER THE COUNTER MEDICATION Take 1 tablet by mouth daily. Hair Skin and Nails   potassium chloride (KLOR-CON) 10 MEQ tablet Take 1 tablet by mouth twice daily   QUERCETIN PO Take 1 tablet by mouth daily in the afternoon.   UNABLE TO FIND CBD oil '1000mg'$    VITAMIN K PO Take 1 tablet by mouth daily. Potassium   XARELTO 20 MG TABS tablet TAKE 1 TABLET BY MOUTH ONCE DAILY WITH SUPPER   diltiazem (CARDIZEM) 30 MG tablet Take 1 tablet every  4 hours AS NEEDED for heart rate >100 (Patient not taking: Reported on 06/17/2022)   No facility-administered encounter medications on file as of 06/17/2022.    Allergies (verified) Pseudoephedrine   History: Past Medical History:  Diagnosis Date   Arthritis    Back problem    Cataracts, bilateral    Chicken pox    Disorder of adrenal gland (HCC)    Disorders of sulfur-bearing amino-acid metabolism, unspecified (HCC)    Displaced fracture of base of unspecified metacarpal bone, initial encounter for closed fracture    Gallstones    Hyperlipidemia    Hypertension    Insomnia    Iron deficiency anemia, unspecified    mostly vegetarian   Measles    Mumps    Sciatica    Sensitivity to medication     Sprain of unspecified ligament of right ankle, subsequent encounter    Vitamin D deficiency, unspecified    No past surgical history on file. Family History  Problem Relation Age of Onset   Hypertension Mother    Hypothyroidism Mother    Heart failure Mother    Stroke Mother    Bone cancer Father    Heart disease Maternal Grandfather 72   Hypertension Maternal Grandfather    Glaucoma Maternal Grandmother    Mental illness Paternal Grandmother    Diabetes Paternal Grandfather    Angina Paternal Grandfather    Heart disease Brother    Hypertension Brother    Melanoma Paternal Uncle 32   Mental illness Paternal Aunt    Autoimmune disease Maternal Uncle    Prostate cancer Maternal Uncle    Kidney Stones Maternal Uncle    Stroke Maternal Uncle    Parkinson's disease Maternal Aunt    Hypertension Maternal Aunt    Glaucoma Maternal Aunt    Parkinson's disease Brother    Social History   Socioeconomic History   Marital status: Married    Spouse name: Not on file   Number of children: Not on file   Years of education: Not on file   Highest education level: Not on file  Occupational History   Occupation: FREELANCE VIOLINIST   Occupation: TEACHER  Tobacco Use   Smoking status: Never   Smokeless tobacco: Never   Tobacco comments:    Never smoke 12/28/21  Substance and Sexual Activity   Alcohol use: Not Currently    Alcohol/week: 1.0 standard drink of alcohol    Types: 1 Standard drinks or equivalent per week    Comment: very small amount occasionally 09/29/21   Drug use: No   Sexual activity: Never  Other Topics Concern   Not on file  Social History Narrative   Freelance violinist   Lives with husband   2 children -- remain local   No grandchildren      Prefers Integrative Medicine, but okay with a DO. Previously saw Dr. Sharol Roussel. Pescatarian for the most part. Intermittent fasting with a 6 hour eating window. Hx of feeling sick after flu shot in the past, so declines  flu shot. Hep C in 2013. Hx of breast biopsy. Hx of hypokalemia, previously on KLC 10 mEQ daily. Patient wants to lose 20 pounds in order to get BP at goal rather than try medication. FamHx of HTN. Previous exercise included HIIT at the gym. During COVID, has started baking more and not exercising as much. 03/03/2019    Social Determinants of Health   Financial Resource Strain: Low Risk  (06/17/2022)   Overall Financial Resource  Strain (CARDIA)    Difficulty of Paying Living Expenses: Not hard at all  Food Insecurity: No Food Insecurity (06/17/2022)   Hunger Vital Sign    Worried About Running Out of Food in the Last Year: Never true    Ran Out of Food in the Last Year: Never true  Transportation Needs: No Transportation Needs (06/17/2022)   PRAPARE - Hydrologist (Medical): No    Lack of Transportation (Non-Medical): No  Physical Activity: Sufficiently Active (06/17/2022)   Exercise Vital Sign    Days of Exercise per Week: 5 days    Minutes of Exercise per Session: 30 min  Stress: No Stress Concern Present (06/17/2022)   [place]    Feeling of Stress : Not at all  Social Connections: Moderately Integrated (06/17/2022)   Social Connection and Isolation Panel [NHANES]    Frequency of Communication with Friends and Family: Once a week    Frequency of Social Gatherings with Friends and Family: More than three times a week    Attends Religious Services: More than 4 times per year    Active Member of [employer] or Organizations: No    Attends Archivist Meetings: Never    Marital Status: Married    Tobacco Counseling Counseling given: Not Answered Tobacco comments: Never smoke 12/28/21   Clinical Intake:  Pre-visit preparation completed: Yes  Pain : No/denies pain     BMI - recorded: 25.69 Nutritional Status: BMI 25 -29 Overweight Nutritional Risks: None Diabetes: No  How  often do you need to have someone help you when you read instructions, pamphlets, or other written materials from your doctor or pharmacy?: 1 - Never  Diabetic?no  Interpreter Needed?: No  Information entered by :: Charlott Rakes, LPN   Activities of Daily Living    06/17/2022   12:12 PM  In your present state of health, do you have any difficulty performing the following activities:  Hearing? 0  Vision? 0  Difficulty concentrating or making decisions? 0  Walking or climbing stairs? 0  Dressing or bathing? 0  Doing errands, shopping? 0  Preparing Food and eating ? N  Using the Toilet? N  In the past six months, have you accidently leaked urine? N  Do you have problems with loss of bowel control? N  Managing your Medications? N  Managing your Finances? N  Housekeeping or managing your Housekeeping? N    Patient Care Team: Inda Coke, [place] as PCP - General (Physician Assistant) End, Harrell Gave, MD as Consulting Physician (Cardiology) Leta Baptist, MD as Consulting Physician (Otolaryngology) Sherlyn Hay, DO as Consulting Physician (Obstetrics and Gynecology) Briscoe Deutscher, DO (Family Medicine)  Indicate any recent Medical Services you may have received from other than [facility] providers in the past year (date may be approximate).     Assessment:   This is a routine wellness examination for Melinda [name].  Hearing/Vision screen Hearing Screening - Comments:: Pt denies any hearing issue Vision Screening - Comments:: Follows up with Dr Macarthur Critchley for annual eye exams   Dietary issues and exercise activities discussed: Current Exercise Habits: Home exercise routine, Type of exercise: walking;Other - see comments, Time (Minutes): 30, Frequency (Times/Week): 5, Weekly Exercise (Minutes/Week): 150   Goals Addressed             This Visit's Progress    Patient Stated       Lose weight and walk more  Depression Screen    06/17/2022   12:09 PM 11/01/2021    10:24 AM 09/15/2021    2:27 PM 07/13/2020   11:12 AM 01/21/2016    1:49 PM 12/30/2015   11:39 AM 12/02/2015   11:25 AM  PHQ 2/9 Scores  PHQ - 2 Score 0 0 0 0 0 0 0  PHQ- 9 Score    0       Fall Risk    06/17/2022   12:11 PM 11/01/2021   10:24 AM 09/15/2021    2:26 PM 07/13/2020   11:12 AM 01/21/2016    1:49 PM  Fall Risk   Falls in the past year? 1 0 0 0 Yes  Number falls in past yr: 1 0  0 1  Injury with Fall? 1 0  0 Yes  Comment left side wrist and hand    fracture Left toes - Great toe and 2nd toe  Risk for fall due to : Impaired vision No Fall Risks No Fall Risks    Follow up Falls prevention discussed        FALL RISK PREVENTION PERTAINING TO THE HOME:  Any stairs in or around the home? No  If so, are there any without handrails? No  Home free of loose throw rugs in walkways, pet beds, electrical cords, etc? Yes  Adequate lighting in your home to reduce risk of falls? Yes   ASSISTIVE DEVICES UTILIZED TO PREVENT FALLS:  Life alert? Apple watch  Use of a cane, walker or w/c? No  Grab bars in the bathroom? Yes  Shower chair or bench in shower? No  Elevated toilet seat or a handicapped toilet? No   TIMED UP AND GO:  Was the test performed? No .   Cognitive Function:    02/12/2019   10:03 AM  MMSE - Mini Mental State Exam  Orientation to time 5  Orientation to Place 5  Registration 3  Attention/ Calculation 5  Recall 3  Language- name 2 objects 2  Language- repeat 1  Language- follow 3 step command 3  Language- read & follow direction 1  Write a sentence 1  Copy design 1  Total score 30        06/17/2022   12:12 PM  6CIT Screen  What Year? 0 points  What month? 0 points  What time? 0 points  Count back from 20 0 points  Months in reverse 0 points  Repeat phrase 0 points  Total Score 0 points    Immunizations Immunization History  Administered Date(s) Administered   PFIZER(Purple Top)SARS-COV-2 Vaccination 09/22/2019, 10/15/2019, 06/12/2020,  07/08/2021   Tdap 12/17/2021    TDAP status: Up to date  Flu Vaccine status: Up to date  Pneumococcal vaccine status: Declined,  Education has been provided regarding the importance of this vaccine but patient still declined. Advised may receive this vaccine at local pharmacy or Health Dept. Aware to provide a copy of the vaccination record if obtained from local pharmacy or Health Dept. Verbalized acceptance and understanding.   Covid-19 vaccine status: Completed vaccines  Qualifies for Shingles Vaccine? Yes   Zostavax completed No   Shingrix Completed?: No.    Education has been provided regarding the importance of this vaccine. Patient has been advised to call insurance company to determine out of pocket expense if they have not yet received this vaccine. Advised may also receive vaccine at local pharmacy or Health Dept. Verbalized acceptance and understanding.  Screening Tests Health Maintenance  Topic  Date Due   MAMMOGRAM  09/15/2022 (Originally 03/26/2014)   DEXA SCAN  09/15/2022 (Originally 06/22/2018)   COLONOSCOPY (Pts 45-55yr Insurance coverage will need to be confirmed)  03/01/2023 (Originally 06/22/1998)   Medicare Annual Wellness (AWV)  06/18/2023   TETANUS/TDAP  12/18/2031   Hepatitis C Screening  Completed   HPV VACCINES  Aged Out   Pneumonia Vaccine 71 Years old  Discontinued   INFLUENZA VACCINE  Discontinued   COVID-19 Vaccine  Discontinued   Zoster Vaccines- Shingrix  Discontinued    Health Maintenance  There are no preventive care reminders to display for this patient.   Pt has a screening test for colon cancer   Mammogram pt will discuss with SInda Coke  Bone density postponed at this time   Additional Screening:  Hepatitis C Screening:  Completed 03/30/12  Vision Screening: Recommended annual ophthalmology exams for early detection of glaucoma and other disorders of the eye. Is the patient up to date with their annual eye exam?  Yes  Who is  the provider or what is the name of the office in which the patient attends annual eye exams? Dr JMacarthur Critchley If pt is not established with a provider, would they like to be referred to a provider to establish care? No .   Dental Screening: Recommended annual dental exams for proper oral hygiene  Community Resource Referral / Chronic Care Management: CRR required this visit?  No   CCM required this visit?  No      Plan:     I have personally reviewed and noted the following in the patient's chart:   Medical and social history Use of alcohol, tobacco or illicit drugs  Current medications and supplements including opioid prescriptions. Patient is not currently taking opioid prescriptions. Functional ability and status Nutritional status Physical activity Advanced directives List of other physicians Hospitalizations, surgeries, and ER visits in previous 12 months Vitals Screenings to include cognitive, depression, and falls Referrals and appointments  In addition, I have reviewed and discussed with patient certain preventive protocols, quality metrics, and best practice recommendations. A written personalized care plan for preventive services as well as general preventive health recommendations were provided to patient.     TWillette Brace LPN   [id number]  Nurse Notes: none

## 2022-06-23 DIAGNOSIS — M79601 Pain in right arm: Secondary | ICD-10-CM | POA: Diagnosis not present

## 2022-06-27 ENCOUNTER — Ambulatory Visit (HOSPITAL_COMMUNITY)
Admission: RE | Admit: 2022-06-27 | Discharge: 2022-06-27 | Disposition: A | Discharge status: Home / Self Care | Payer: Medicare HMO | Source: Ambulatory Visit | Attending: Physician Assistant | Admitting: Physician Assistant

## 2022-06-27 VITALS — BP 166/84 | HR 58 | Ht 67.0 in | Wt 166.0 lb

## 2022-06-27 DIAGNOSIS — R001 Bradycardia, unspecified: Secondary | ICD-10-CM | POA: Insufficient documentation

## 2022-06-27 DIAGNOSIS — Z7901 Long term (current) use of anticoagulants: Secondary | ICD-10-CM | POA: Diagnosis not present

## 2022-06-27 DIAGNOSIS — I480 Paroxysmal atrial fibrillation: Secondary | ICD-10-CM

## 2022-06-27 DIAGNOSIS — I4892 Unspecified atrial flutter: Secondary | ICD-10-CM | POA: Insufficient documentation

## 2022-06-27 DIAGNOSIS — D6869 Other thrombophilia: Secondary | ICD-10-CM

## 2022-06-27 DIAGNOSIS — I10 Essential (primary) hypertension: Secondary | ICD-10-CM | POA: Insufficient documentation

## 2022-06-27 DIAGNOSIS — R0683 Snoring: Secondary | ICD-10-CM | POA: Insufficient documentation

## 2022-06-27 DIAGNOSIS — Z79899 Other long term (current) drug therapy: Secondary | ICD-10-CM | POA: Insufficient documentation

## 2022-06-27 DIAGNOSIS — R400 Somnolence: Secondary | ICD-10-CM | POA: Insufficient documentation

## 2022-06-27 NOTE — Progress Notes (Signed)
Primary Care Physician: Inda Coke, PA Primary Cardiologist: Dr End (remotely) Primary Electrophysiologist: none Referring Physician: [facility]   Melinda [name] is a 71 y.o. female with a history of HLD, HTN, atrial fibrillation who presents for follow up in the [facility].  The patient was initially diagnosed with atrial fibrillation and atrial flutter on 02/04/21 when she presented to her PCP with palpitations. ECG showed rapid atrial flutter and she was sent to the ED. She was started on rate control and then her ECG showed afib. Patient was started on Xarelto for a CHADS2VASC score of 3. She since converted back to SR. She drinks very little alcohol but does admit to snoring.   On follow up today, patient reports that she had two episodes of afib since her last visit, both resolving quickly with PRN diltiazem. No bleeding issues on anticoagulation.   Today, she denies symptoms of palpitations, chest pain, shortness of breath, orthopnea, PND, lower extremity edema, dizziness, presyncope, syncope, bleeding, or neurologic sequela. The patient is tolerating medications without difficulties and is otherwise without complaint today.    Atrial Fibrillation Risk Factors:  she does have symptoms or diagnosis of sleep apnea. she does not have a history of rheumatic fever. she does not have a history of alcohol use. The patient does not have a history of early familial atrial fibrillation or other arrhythmias.  she has a BMI of Body mass index is 26 kg/m.Marland Kitchen Filed Weights   06/27/22 1057  Weight: 75.3 kg    Family History  Problem Relation Age of Onset   Hypertension Mother    Hypothyroidism Mother    Heart failure Mother    Stroke Mother    Bone cancer Father    Heart disease Maternal Grandfather 68   Hypertension Maternal Grandfather    Glaucoma Maternal Grandmother    Mental illness Paternal Grandmother    Diabetes Paternal Grandfather     Angina Paternal Grandfather    Heart disease Brother    Hypertension Brother    Melanoma Paternal Uncle 50   Mental illness Paternal Aunt    Autoimmune disease Maternal Uncle    Prostate cancer Maternal Uncle    Kidney Stones Maternal Uncle    Stroke Maternal Uncle    Parkinson's disease Maternal Aunt    Hypertension Maternal Aunt    Glaucoma Maternal Aunt    Parkinson's disease Brother      Atrial Fibrillation Management history:  Previous antiarrhythmic drugs: none Previous cardioversions: none Previous ablations: none CHADS2VASC score: 3 Anticoagulation history: Xarelto   Past Medical History:  Diagnosis Date   Arthritis    Back problem    Cataracts, bilateral    Chicken pox    Disorder of adrenal gland (HCC)    Disorders of sulfur-bearing amino-acid metabolism, unspecified (HCC)    Displaced fracture of base of unspecified metacarpal bone, initial encounter for closed fracture    Gallstones    Hyperlipidemia    Hypertension    Insomnia    Iron deficiency anemia, unspecified    mostly vegetarian   Measles    Mumps    Sciatica    Sensitivity to medication    Sprain of unspecified ligament of right ankle, subsequent encounter    Vitamin D deficiency, unspecified    No past surgical history on file.  Current Outpatient Medications  Medication Sig Dispense Refill   Acetylcysteine (NAC) 600 MG CAPS Take 2 capsules by mouth daily in the afternoon.  Ascorbic Acid (VITAMIN C PO) Take 1 tablet by mouth daily in the afternoon. Vitamin C and Bio-Quercetin Phytosome     Cholecalciferol 1.25 MG (50000 UT) TABS Take 1 tablet by mouth once a week.     Digestive Enzymes (DIGESTIVE ENZYME PO) Take 1 tablet by mouth daily in the afternoon. Primary Digest- Brand     diltiazem (CARDIZEM CD) 120 MG 24 hr capsule Take 1 capsule by mouth once daily 30 capsule 6   diltiazem (CARDIZEM) 30 MG tablet Take 1 tablet every 4 hours AS NEEDED for heart rate >100 (Patient not taking:  Reported on 06/17/2022) 30 tablet 1   GLYCINE PO Take 500 mg by mouth. Take 4 tabs at bedtime on an empty stomach or open and melt . May repeat in the middle of the night if needed     Iodine Strong, Lugols, (IODINE STRONG PO) Take 400 mcg by mouth.     magnesium gluconate (MAGONATE) 500 MG tablet Take 500 mg by mouth daily in the afternoon.     MAGNESIUM GLYCINATE PO Take 1 tablet by mouth daily.     Multiple Vitamins-Minerals (MULTIVITAMIN WOMEN PO) Take 1 tablet by mouth daily in the afternoon. Vitamin Code 50 and wiser     NON FORMULARY P 5 P 50 mg  -Taking 1 tablet by mouth once a day     Nutritional Supplements (DETOX ENZYME FORMULA PO) Take by mouth. Reported on 01/21/2016     OVER THE COUNTER MEDICATION Take 4 tablets by mouth daily.     OVER THE COUNTER MEDICATION Take 1 capsule by mouth daily in the afternoon. Unique E-     OVER THE COUNTER MEDICATION Take 1 tablet by mouth daily in the afternoon. MacuGuard Ocular Support     OVER THE COUNTER MEDICATION Take 1 tablet by mouth daily in the afternoon. LV-GB Complex     OVER THE COUNTER MEDICATION Take 1 tablet by mouth daily. Hair Skin and Nails     potassium chloride (KLOR-CON) 10 MEQ tablet Take 1 tablet by mouth twice daily 60 tablet 0   QUERCETIN PO Take 1 tablet by mouth daily in the afternoon.     UNABLE TO FIND CBD oil '1000mg'$      VITAMIN K PO Take 1 tablet by mouth daily. Potassium     XARELTO 20 MG TABS tablet TAKE 1 TABLET BY MOUTH ONCE DAILY WITH SUPPER 30 tablet 6   No current facility-administered medications for this encounter.    Allergies  Allergen Reactions   Pseudoephedrine Other (See Comments)    Hyper active     Social History   Socioeconomic History   Marital status: Married    Spouse name: Not on file   Number of children: Not on file   Years of education: Not on file   Highest education level: Not on file  Occupational History   Occupation: FREELANCE VIOLINIST   Occupation: TEACHER  Tobacco Use    Smoking status: Never   Smokeless tobacco: Never   Tobacco comments:    Never smoke 12/28/21  Substance and Sexual Activity   Alcohol use: Not Currently    Alcohol/week: 1.0 standard drink of alcohol    Types: 1 Standard drinks or equivalent per week    Comment: very small amount occasionally 09/29/21   Drug use: No   Sexual activity: Never  Other Topics Concern   Not on file  Social History Narrative   Freelance violinist   Lives with husband  2 children -- remain local   No grandchildren      Prefers Integrative Medicine, but okay with a DO. Previously saw Dr. Sharol Roussel. Pescatarian for the most part. Intermittent fasting with a 6 hour eating window. Hx of feeling sick after flu shot in the past, so declines flu shot. Hep C in 2013. Hx of breast biopsy. Hx of hypokalemia, previously on KLC 10 mEQ daily. Patient wants to lose 20 pounds in order to get BP at goal rather than try medication. FamHx of HTN. Previous exercise included HIIT at the gym. During COVID, has started baking more and not exercising as much. 03/03/2019    Social Determinants of Health   Financial Resource Strain: Low Risk  (06/17/2022)   Overall Financial Resource Strain (CARDIA)    Difficulty of Paying Living Expenses: Not hard at all  Food Insecurity: No Food Insecurity (06/17/2022)   Hunger Vital Sign    Worried About Running Out of Food in the Last Year: Never true    Ran Out of Food in the Last Year: Never true  Transportation Needs: No Transportation Needs (06/17/2022)   PRAPARE - Hydrologist (Medical): No    Lack of Transportation (Non-Medical): No  Physical Activity: Sufficiently Active (06/17/2022)   Exercise Vital Sign    Days of Exercise per Week: 5 days    Minutes of Exercise per Session: 30 min  Stress: No Stress Concern Present (06/17/2022)   [place]    Feeling of Stress : Not at all  Social  Connections: Moderately Integrated (06/17/2022)   Social Connection and Isolation Panel [NHANES]    Frequency of Communication with Friends and Family: Once a week    Frequency of Social Gatherings with Friends and Family: More than three times a week    Attends Religious Services: More than 4 times per year    Active Member of [employer] or Organizations: No    Attends Archivist Meetings: Never    Marital Status: Married  Human resources officer Violence: Not At Risk (06/17/2022)   Humiliation, Afraid, Rape, and Kick questionnaire    Fear of Current or Ex-Partner: No    Emotionally Abused: No    Physically Abused: No    Sexually Abused: No     ROS- All systems are reviewed and negative except as per the HPI above.  Physical Exam: Vitals:   06/27/22 1057  BP: (!) 166/84  Pulse: (!) 58  Weight: 75.3 kg  Height: '5\' 7"'$  (1.702 m)     GEN- The patient is a well appearing female, alert and oriented x 3 today.   HEENT-head normocephalic, atraumatic, sclera clear, conjunctiva pink, hearing intact, trachea midline. Lungs- Clear to ausculation bilaterally, normal work of breathing Heart- Regular rate and rhythm, no murmurs, rubs or gallops  GI- soft, NT, ND, + BS Extremities- no clubbing, cyanosis, or edema MS- no significant deformity or atrophy Skin- no rash or lesion Psych- euthymic mood, full affect Neuro- strength and sensation are intact   Wt Readings from Last 3 Encounters:  06/27/22 75.3 kg  06/17/22 74.4 kg  05/20/22 74.8 kg    EKG today demonstrates  SB Vent. rate 58 BPM PR interval 142 ms QRS duration 80 ms QT/QTcB 432/424 ms  Echo 03/02/21  1. Left ventricular ejection fraction by 3D volume is 62 %. The left  ventricle has normal function. The left ventricle has no regional wall  motion abnormalities. Left  ventricular diastolic parameters were normal.   2. Right ventricular systolic function is normal. The right ventricular  size is normal. There is normal  pulmonary artery systolic pressure.   3. The mitral valve is normal in structure. Mild mitral valve  regurgitation. No evidence of mitral stenosis.   4. The aortic valve is grossly normal. Aortic valve regurgitation is  trivial. No aortic stenosis is present.   Epic records are reviewed at length today  CHA2DS2-VASc Score = 3  The patient's score is based upon: CHF History: 0 HTN History: 1 Diabetes History: 0 Stroke History: 0 Vascular Disease History: 0 Age Score: 1 Gender Score: 1        ASSESSMENT AND PLAN: 1. Paroxsymal Atrial Fibrillation/atrial flutter The patient's CHA2DS2-VASc score is 3, indicating a 3.2% annual risk of stroke.   Very low afib burden, responds quickly to PRN diltiazem.  Continue diltiazem 30 mg PRN q 4 hours for heart racing/palpitations.  If her afib becomes more persistent/frequent then she would consider AAD. She would like to avoid ablation if possible.  Continue Xarelto 20 mg daily Continue diltiazem 120 mg daily Apple watch for home monitoring.  2. Secondary Hypercoagulable State (ICD10:  D68.69) The patient is at significant risk for stroke/thromboembolism based upon her CHA2DS2-VASc Score of 3.  Continue Rivaroxaban (Xarelto).   3. HTN Elevated today, has been controlled at previous visits.  Continue to monitor for now.   4. Snoring/daytime somnolence The importance of adequate treatment of sleep apnea was discussed today in order to improve our ability to maintain sinus rhythm long term. Patient agreeable to sleep study referral.    Follow up in the AF clinic in 6 months. Will refer to establish care with a primary cardiologist.    Adline Peals PA-C [facility] [address] [place], [place] [address] [phone] 06/27/2022 1:12 PM

## 2022-06-28 ENCOUNTER — Telehealth: Payer: Self-pay | Admitting: *Deleted

## 2022-06-28 NOTE — Telephone Encounter (Signed)
Prior Authorization for HOME SLEEP TEST sent to [employer] via Phone. Reference # NO PA REQ.Marland Kitchen

## 2022-07-06 DIAGNOSIS — M79601 Pain in right arm: Secondary | ICD-10-CM | POA: Diagnosis not present

## 2022-07-13 DIAGNOSIS — M79601 Pain in right arm: Secondary | ICD-10-CM | POA: Diagnosis not present

## 2022-07-21 DIAGNOSIS — M79601 Pain in right arm: Secondary | ICD-10-CM | POA: Diagnosis not present

## 2022-07-25 ENCOUNTER — Encounter (HOSPITAL_BASED_OUTPATIENT_CLINIC_OR_DEPARTMENT_OTHER): Payer: Medicare HMO | Admitting: Cardiovascular Disease

## 2022-08-05 ENCOUNTER — Ambulatory Visit (HOSPITAL_BASED_OUTPATIENT_CLINIC_OR_DEPARTMENT_OTHER): Payer: Medicare HMO | Attending: Physician Assistant | Admitting: Cardiovascular Disease

## 2022-08-05 DIAGNOSIS — G4733 Obstructive sleep apnea (adult) (pediatric): Secondary | ICD-10-CM | POA: Diagnosis not present

## 2022-08-05 DIAGNOSIS — I480 Paroxysmal atrial fibrillation: Secondary | ICD-10-CM | POA: Diagnosis not present

## 2022-08-12 NOTE — Procedures (Signed)
           Patient Name: Melinda [name], Melinda [name] Date: 08/05/2022 Gender: Female D.O.B: 03-28-1951 Age (years): 64 Referring Provider: Malka So PA Height (inches): 67 Interpreting Physician: Shelva Majestic MD, ABSM Weight (lbs): 162 RPSGT: Jacolyn Reedy BMI: 25 MRN: [id number] Neck Size: 13.00  CLINICAL INFORMATION Sleep Study Type: HST  Indication for sleep study: Snoring, PAF, HTN  Epworth Sleepiness Score: 7  SLEEP STUDY TECHNIQUE A multi-channel overnight portable sleep study was performed. The channels recorded were: nasal airflow, thoracic respiratory movement, and oxygen saturation with a pulse oximetry. Snoring was also monitored.  MEDICATIONS Acetylcysteine (NAC) 600 MG CAPS Ascorbic Acid (VITAMIN C PO) Cholecalciferol 1.25 MG (50000 UT) TABS Digestive Enzymes (DIGESTIVE ENZYME PO) diltiazem (CARDIZEM CD) 120 MG 24 hr capsule diltiazem (CARDIZEM) 30 MG tablet GLYCINE PO Iodine Strong, Lugols, (IODINE STRONG PO) magnesium gluconate (MAGONATE) 500 MG tablet MAGNESIUM GLYCINATE PO Multiple Vitamins-Minerals (MULTIVITAMIN WOMEN PO) NON FORMULARY Nutritional Supplements (DETOX ENZYME FORMULA PO) potassium chloride (KLOR-CON) 10 MEQ tablet QUERCETIN PO UNABLE TO FIND VITAMIN K PO XARELTO 20 MG TABS tablet Patient self administered medications include: N/A.  SLEEP ARCHITECTURE Patient was studied for 573.9 minutes. The sleep efficiency was 100.0 % and the patient was supine for 0%. The arousal index was 0.0 per hour.  RESPIRATORY PARAMETERS The overall AHI was 6.3 per hour, with a central apnea index of 0 per hour. Supine AHI11.8/h versus non-supine AHI 5.1/h.  The oxygen nadir was 93% during sleep.  CARDIAC DATA Mean heart rate during sleep was 58.4 bpm. Heart rate range: 50 - 84 bpm.  IMPRESSIONS - Mild obstructive sleep apnea occurred during this study (AHI 6.3/h). - The patient had no oxygen desaturation during the study (Min O2 93%) - Patient  snored 14.6 minutes ( 2.5%) during the sleep.  DIAGNOSIS - Obstructive Sleep Apnea (G47.33)  RECOMMENDATIONS - Therapeutic CPAP titration to determine optimal pressure required to alleviate sleep disordered breathing. Consider an initial trial of CPAP Auto therapy with EPR of 2 at 5 - 15 cm of water versus alternative therapy. - Effort should be made to optimize nasal and oropharyngeal patency. - The patient should be counseled to avoid supine sleep. - With only mild sleep apnea can consider alternative therapy with a customized oral appliance. - Avoid alcohol, sedatives and other CNS depressants that may worsen sleep apnea and disrupt normal sleep architecture. - Sleep hygiene should be reviewed to assess factors that may improve sleep quality. - Weight management (BMI 25) and regular exercise should be initiated or continued. - Return to Sleep Center to discuss the results of this study or following initiation of CPAP.      [Electronically signed] 08/12/2022 01:34 PM  Shelva Majestic MD, [facility], ABSM Diplomate, American Board of Sleep Medicine  NPI: [id number]   [place] PH: [phone]   FX: [phone] [place]

## 2022-08-15 ENCOUNTER — Telehealth: Payer: Self-pay | Admitting: Physician Assistant

## 2022-08-15 DIAGNOSIS — M79602 Pain in left arm: Secondary | ICD-10-CM

## 2022-08-15 NOTE — Telephone Encounter (Signed)
Patient states  [place]  Needs a referral for Left arm pain to be sent to the above facility - Samantha sent one for right arm and she was able to control it with Tylelon-

## 2022-08-15 NOTE — Telephone Encounter (Signed)
Please see message and advise if referral okay or does pt need an appt?

## 2022-08-16 NOTE — Telephone Encounter (Signed)
Spoke to pt told her referral has been placed for PT as requested and someone will contact you to schedule an appt. Pt verbalized understanding.

## 2022-08-22 DIAGNOSIS — M79601 Pain in right arm: Secondary | ICD-10-CM | POA: Diagnosis not present

## 2022-08-22 DIAGNOSIS — M79602 Pain in left arm: Secondary | ICD-10-CM | POA: Diagnosis not present

## 2022-08-28 ENCOUNTER — Other Ambulatory Visit: Payer: Self-pay | Admitting: Physician Assistant

## 2022-08-28 DIAGNOSIS — E876 Hypokalemia: Secondary | ICD-10-CM

## 2022-08-30 ENCOUNTER — Encounter: Payer: Self-pay | Admitting: Physician Assistant

## 2022-08-30 LAB — FECAL OCCULT BLOOD, IMMUNOCHEMICAL: IFOBT: NEGATIVE

## 2022-08-31 ENCOUNTER — Telehealth: Payer: Self-pay | Admitting: *Deleted

## 2022-08-31 DIAGNOSIS — M79601 Pain in right arm: Secondary | ICD-10-CM | POA: Diagnosis not present

## 2022-08-31 DIAGNOSIS — M79602 Pain in left arm: Secondary | ICD-10-CM | POA: Diagnosis not present

## 2022-08-31 NOTE — Telephone Encounter (Signed)
Patient notified of sleep study results and recommendations. She requests for referral for oral appliance to be sent to Dr Oneal Grout.

## 2022-08-31 NOTE — Telephone Encounter (Signed)
Referral order and records for oral appliance consult faxed to Dr Oneal Grout.

## 2022-09-08 ENCOUNTER — Encounter: Payer: Self-pay | Admitting: Physician Assistant

## 2022-09-12 ENCOUNTER — Ambulatory Visit (INDEPENDENT_AMBULATORY_CARE_PROVIDER_SITE_OTHER): Payer: Medicare HMO | Admitting: Physician Assistant

## 2022-09-12 ENCOUNTER — Encounter: Payer: Self-pay | Admitting: Physician Assistant

## 2022-09-12 VITALS — BP 120/70 | HR 77 | Temp 97.7°F | Ht 67.0 in | Wt 163.5 lb

## 2022-09-12 DIAGNOSIS — C44529 Squamous cell carcinoma of skin of other part of trunk: Secondary | ICD-10-CM

## 2022-09-12 DIAGNOSIS — L989 Disorder of the skin and subcutaneous tissue, unspecified: Secondary | ICD-10-CM

## 2022-09-12 NOTE — Patient Instructions (Signed)
It was great to see you!  I'll keep you posted on the results of this skin lesion and its definitive diagnosis.  Take care,  Inda Coke PA-C

## 2022-09-12 NOTE — Progress Notes (Signed)
Melinda [name] is a 72 y.o. female here for a new problem.  History of Present Illness:   Chief Complaint  Patient presents with   Skin Problem    Pt is c/o rash on back, itching.    HPI  Skin lesion Patient has a skin lesion on her back x a few months. The area is bothersome, can be tender - on her bra line. Denies fevers, chills, n/v/d. She has put topical silver on it. It has not drained. She has had these in the past -- she thinks that they were dermatofibromas.  Past Medical History:  Diagnosis Date   Arthritis    Back problem    Cataracts, bilateral    Chicken pox    Disorder of adrenal gland ([place])    Disorders of sulfur-bearing amino-acid metabolism, unspecified ([place])    Displaced fracture of base of unspecified metacarpal bone, initial encounter for closed fracture    Gallstones    Hyperlipidemia    Hypertension    Insomnia    Iron deficiency anemia, unspecified    mostly vegetarian   Measles    Mumps    Sciatica    Sensitivity to medication    Sprain of unspecified ligament of right ankle, subsequent encounter    Vitamin D deficiency, unspecified      Social History   Tobacco Use   Smoking status: Never   Smokeless tobacco: Never   Tobacco comments:    Never smoke 12/28/21  Substance Use Topics   Alcohol use: Not Currently    Alcohol/week: 1.0 standard drink of alcohol    Types: 1 Standard drinks or equivalent per week    Comment: very small amount occasionally 09/29/21   Drug use: No    History reviewed. No pertinent surgical history.  Family History  Problem Relation Age of Onset   Hypertension Mother    Hypothyroidism Mother    Heart failure Mother    Stroke Mother    Bone cancer Father    Heart disease Maternal Grandfather 106   Hypertension Maternal Grandfather    Glaucoma Maternal Grandmother    Mental illness Paternal Grandmother    Diabetes Paternal Grandfather    Angina Paternal Grandfather    Heart disease Brother     Hypertension Brother    Melanoma Paternal Uncle 32   Mental illness Paternal Aunt    Autoimmune disease Maternal Uncle    Prostate cancer Maternal Uncle    Kidney Stones Maternal Uncle    Stroke Maternal Uncle    Parkinson's disease Maternal Aunt    Hypertension Maternal Aunt    Glaucoma Maternal Aunt    Parkinson's disease Brother     Allergies  Allergen Reactions   Pseudoephedrine Other (See Comments)    Hyper active     Current Medications:   Current Outpatient Medications:    Acetylcysteine (NAC) 600 MG CAPS, Take 1 capsule by mouth daily in the afternoon., Disp: , Rfl:    Ascorbic Acid (VITAMIN C PO), Take 1 tablet by mouth daily in the afternoon. Vitamin C and Bio-Quercetin Phytosome, Disp: , Rfl:    Cholecalciferol 1.25 MG (50000 UT) TABS, Take 1 tablet by mouth once a week., Disp: , Rfl:    Digestive Enzymes (DIGESTIVE ENZYME PO), Take 1 tablet by mouth daily in the afternoon. Primary Digest- Brand, Disp: , Rfl:    diltiazem (CARDIZEM CD) 120 MG 24 hr capsule, Take 1 capsule by mouth once daily, Disp: 30 capsule, Rfl: 6  diltiazem (CARDIZEM) 30 MG tablet, Take 1 tablet every 4 hours AS NEEDED for heart rate >100, Disp: 30 tablet, Rfl: 1   GLYCINE PO, Take 500 mg by mouth. Take 4 tabs at bedtime on an empty stomach or open and melt . May repeat in the middle of the night if needed, Disp: , Rfl:    Iodine Strong, Lugols, (IODINE STRONG PO), Take 400 mcg by mouth., Disp: , Rfl:    magnesium gluconate (MAGONATE) 500 MG tablet, Take 500 mg by mouth daily in the afternoon., Disp: , Rfl:    MAGNESIUM GLYCINATE PO, Take 1 tablet by mouth daily., Disp: , Rfl:    Multiple Vitamins-Minerals (MULTIVITAMIN WOMEN PO), Take 1 tablet by mouth daily in the afternoon. Vitamin Code 50 and wiser, Disp: , Rfl:    NON FORMULARY, P 5 P 50 mg  -Taking 1 tablet by mouth once a day, Disp: , Rfl:    Nutritional Supplements (DETOX ENZYME FORMULA PO), Take by mouth. Reported on 01/21/2016, Disp: , Rfl:     OVER THE COUNTER MEDICATION, Take 4 tablets by mouth daily., Disp: , Rfl:    OVER THE COUNTER MEDICATION, Take 1 capsule by mouth daily in the afternoon. Unique E-, Disp: , Rfl:    OVER THE COUNTER MEDICATION, Take 1 tablet by mouth daily in the afternoon. MacuGuard Ocular Support, Disp: , Rfl:    OVER THE COUNTER MEDICATION, Take 1 tablet by mouth daily in the afternoon. LV-GB Complex, Disp: , Rfl:    OVER THE COUNTER MEDICATION, Take 1 tablet by mouth daily. Hair Skin and Nails, Disp: , Rfl:    potassium chloride (KLOR-CON) 10 MEQ tablet, Take 1 tablet by mouth twice daily, Disp: 60 tablet, Rfl: 0   QUERCETIN PO, Take 1 tablet by mouth daily in the afternoon., Disp: , Rfl:    UNABLE TO FIND, CBD oil '1000mg'$ , Disp: , Rfl:    VITAMIN K PO, Take 1 tablet by mouth daily. Potassium, Disp: , Rfl:    XARELTO 20 MG TABS tablet, TAKE 1 TABLET BY MOUTH ONCE DAILY WITH SUPPER, Disp: 30 tablet, Rfl: 6   Review of Systems:   ROS Negative unless otherwise specified per HPI.   Vitals:   Vitals:   09/12/22 1048  BP: 120/70  Pulse: 77  Temp: 97.7 F (36.5 C)  TempSrc: Temporal  SpO2: 99%  Weight: 163 lb 8 oz (74.2 kg)  Height: '5\' 7"'$  (1.702 m)     Body mass index is 25.61 kg/m.  Physical Exam:   Physical Exam Constitutional:      Appearance: Normal appearance. She is well-developed.  HENT:     Head: Normocephalic and atraumatic.  Eyes:     General: Lids are normal.     Extraocular Movements: Extraocular movements intact.     Conjunctiva/sclera: Conjunctivae normal.  Pulmonary:     Effort: Pulmonary effort is normal.  Musculoskeletal:        General: Normal range of motion.     Cervical back: Normal range of motion and neck supple.  Skin:    General: Skin is warm and dry.     Comments: 0.75 cm raised erythematous lesion to mid back without TTP  Neurological:     Mental Status: She is alert and oriented to person, place, and time.  Psychiatric:        Attention and Perception:  Attention and perception normal.        Mood and Affect: Mood normal.  Behavior: Behavior normal.        Thought Content: Thought content normal.        Judgment: Judgment normal.    Shave biopsy Meds, vitals, and allergies reviewed.  Indication: suspicious lesion Location: back Size: 0.75 cm  Informed consent obtained.  Pt aware of risks not limited to but including infection,  bleeding, damage to near by organs.  Prep: etoh/betadine  Anesthesia: 1%lidocaine with epi, good effect  Shave made with dermablade  Minimal oozing, controlled with silver nitrate  Tolerated well     Assessment and Plan:   Skin lesion Tolerated procedure well Routine postprocedure instructions d/w pt- keep area clean and bandaged, follow up if concerns/spreading erythema/pain. I recommend that she still keep her appt with dermatology on 2/15 to follow-up   Inda Coke, PA-C

## 2022-09-14 DIAGNOSIS — M79602 Pain in left arm: Secondary | ICD-10-CM | POA: Diagnosis not present

## 2022-09-14 DIAGNOSIS — M79601 Pain in right arm: Secondary | ICD-10-CM | POA: Diagnosis not present

## 2022-09-16 ENCOUNTER — Ambulatory Visit: Payer: Medicare HMO | Admitting: Physician Assistant

## 2022-09-16 ENCOUNTER — Telehealth: Payer: Self-pay | Admitting: Physician Assistant

## 2022-09-16 NOTE — Telephone Encounter (Signed)
Pt would like a call back with lab results

## 2022-09-16 NOTE — Telephone Encounter (Signed)
Returned pt call lvm, please see lab note

## 2022-09-22 ENCOUNTER — Telehealth: Payer: Self-pay | Admitting: Physician Assistant

## 2022-09-22 DIAGNOSIS — D1801 Hemangioma of skin and subcutaneous tissue: Secondary | ICD-10-CM | POA: Diagnosis not present

## 2022-09-22 DIAGNOSIS — C44529 Squamous cell carcinoma of skin of other part of trunk: Secondary | ICD-10-CM | POA: Diagnosis not present

## 2022-09-22 DIAGNOSIS — L821 Other seborrheic keratosis: Secondary | ICD-10-CM | POA: Diagnosis not present

## 2022-09-22 NOTE — Telephone Encounter (Signed)
Caller states: -Patient has appt with them today, but they are unable to see pathology result from 09/12/22  Caller requests: - Pathology result be faxed to them at [phone], Attention Dr. Sydnee Levans.

## 2022-09-22 NOTE — Telephone Encounter (Signed)
Pathology results faxed

## 2022-09-23 NOTE — Progress Notes (Unsigned)
Cardiology Office Note:    Date:  09/26/2022   ID:  Melinda [name], Melinda [name] [birth year], MRN [id number]  PCP:  Inda Coke, [place] Providers Cardiologist:  None    Referring MD: Oliver Barre, PA    History of Present Illness:    Melinda [name] is a 72 y.o. female with a hx of HTN, HLD, paroxsymal Afib, and arthritis who was referred by Malka So, PA for further evaluation of Afib.  Was last seen in clinic on 06/2022 by Malka So, PA on 06/2022. Note reviewed. Patient was initially diagnosed with Afib in 01/2021 when she presented to her PCP with palpitations. ECG showed rapid atrial flutter and she was sent to the ED. She was started on rate control and then her ECG showed afib. Patient was started on Xarelto for a CHADS2VASC score of 3. She since converted back to SR. During that visit, the patient had two episodes of Afib that resolved with prn Dilt. TTE 02/2021 with LVEF 62%, normal RV, mild MR.   The patient states that she overall feels well. States she had one episode of Afib after having alcohol during Valentine's Day that lasted about 7 hours before resolving. Prior to this, her last episode of Afib 08/07/22. Otherwise, feels well with no chest pain, SOB, orthopnea or PND. Remains active and feels well with activity. States she does not monitor BP at home but has had white coat HTN for years.  Patient has had her sleep study and has mild OSA. Was fitted for a nasal appliance and her O2 sats have been good on her home monitor.    Past Medical History:  Diagnosis Date   Arthritis    Back problem    Cataracts, bilateral    Chicken pox    Disorder of adrenal gland ([place])    Disorders of sulfur-bearing amino-acid metabolism, unspecified ([place])    Displaced fracture of base of unspecified metacarpal bone, initial encounter for closed fracture    Gallstones    Hyperlipidemia    Hypertension    Insomnia    Iron deficiency anemia, unspecified    mostly  vegetarian   Measles    Mumps    Sciatica    Sensitivity to medication    Sprain of unspecified ligament of right ankle, subsequent encounter    Vitamin D deficiency, unspecified     No past surgical history on file.  Current Medications: Current Meds  Medication Sig   Acetylcysteine (NAC) 600 MG CAPS Take 1 capsule by mouth daily in the afternoon.   Ascorbic Acid (VITAMIN C PO) Take 1 tablet by mouth daily in the afternoon. Vitamin C and Bio-Quercetin Phytosome   Cholecalciferol 1.25 MG (50000 UT) TABS Take 1 tablet by mouth once a week.   Digestive Enzymes (DIGESTIVE ENZYME PO) Take 1 tablet by mouth daily in the afternoon. Primary Digest- Brand   diltiazem (CARDIZEM CD) 120 MG 24 hr capsule Take 1 capsule by mouth once daily   diltiazem (CARDIZEM) 30 MG tablet Take 1 tablet every 4 hours AS NEEDED for heart rate >100   GLYCINE PO Take 500 mg by mouth. Take 4 tabs at bedtime on an empty stomach or open and melt . May repeat in the middle of the night if needed   Iodine Strong, Lugols, (IODINE STRONG PO) Take 400 mcg by mouth.   magnesium gluconate (MAGONATE) 500 MG tablet Take 500 mg by mouth daily in the afternoon.   MAGNESIUM  GLYCINATE PO Take 1 tablet by mouth daily.   Multiple Vitamins-Minerals (MULTIVITAMIN WOMEN PO) Take 1 tablet by mouth daily in the afternoon. Vitamin Code 50 and wiser   NON FORMULARY P 5 P 50 mg  -Taking 1 tablet by mouth once a day   Nutritional Supplements (DETOX ENZYME FORMULA PO) Take by mouth. Reported on 01/21/2016   OVER THE COUNTER MEDICATION Take 4 tablets by mouth daily.   OVER THE COUNTER MEDICATION Take 1 capsule by mouth daily in the afternoon. Unique E-   OVER THE COUNTER MEDICATION Take 1 tablet by mouth daily in the afternoon. MacuGuard Ocular Support   OVER THE COUNTER MEDICATION Take 1 tablet by mouth daily in the afternoon. LV-GB Complex   OVER THE COUNTER MEDICATION Take 1 tablet by mouth daily. Hair Skin and Nails   potassium chloride  (KLOR-CON) 10 MEQ tablet Take 1 tablet by mouth twice daily   QUERCETIN PO Take 1 tablet by mouth daily in the afternoon.   UNABLE TO FIND CBD oil 1039m   Vitamin D, Ergocalciferol, (DRISDOL) 1.25 MG (50000 UNIT) CAPS capsule Take 50,000 Units by mouth once a week.   VITAMIN K PO Take 1 tablet by mouth daily. Potassium   XARELTO 20 MG TABS tablet TAKE 1 TABLET BY MOUTH ONCE DAILY WITH SUPPER     Allergies:   Pseudoephedrine   Social History   Socioeconomic History   Marital status: Married    Spouse name: Not on file   Number of children: Not on file   Years of education: Not on file   Highest education level: Not on file  Occupational History   Occupation: FREELANCE VIOLINIST   Occupation: TEACHER  Tobacco Use   Smoking status: Never   Smokeless tobacco: Never   Tobacco comments:    Never smoke 12/28/21  Substance and Sexual Activity   Alcohol use: Not Currently    Alcohol/week: 1.0 standard drink of alcohol    Types: 1 Standard drinks or equivalent per week    Comment: very small amount occasionally 09/29/21   Drug use: No   Sexual activity: Never  Other Topics Concern   Not on file  Social History Narrative   Freelance violinist   Lives with husband   2 children -- remain local   No grandchildren      Prefers Integrative Medicine, but okay with a DO. Previously saw Dr. VSharol Roussel Pescatarian for the most part. Intermittent fasting with a 6 hour eating window. Hx of feeling sick after flu shot in the past, so declines flu shot. Hep C in 2013. Hx of breast biopsy. Hx of hypokalemia, previously on KLC 10 mEQ daily. Patient wants to lose 20 pounds in order to get BP at goal rather than try medication. FamHx of HTN. Previous exercise included HIIT at the gym. During COVID, has started baking more and not exercising as much. 03/03/2019    Social Determinants of Health   Financial Resource Strain: Low Risk  (06/17/2022)   Overall Financial Resource Strain (CARDIA)     Difficulty of Paying Living Expenses: Not hard at all  Food Insecurity: No Food Insecurity (06/17/2022)   Hunger Vital Sign    Worried About Running Out of Food in the Last Year: Never true    Ran Out of Food in the Last Year: Never true  Transportation Needs: No Transportation Needs (06/17/2022)   PRAPARE - THydrologist(Medical): No    Lack of Transportation (Non-Medical):  No  Physical Activity: Sufficiently Active (06/17/2022)   Exercise Vital Sign    Days of Exercise per Week: 5 days    Minutes of Exercise per Session: 30 min  Stress: No Stress Concern Present (06/17/2022)   [place]    Feeling of Stress : Not at all  Social Connections: Moderately Integrated (06/17/2022)   Social Connection and Isolation Panel [NHANES]    Frequency of Communication with Friends and Family: Once a week    Frequency of Social Gatherings with Friends and Family: More than three times a week    Attends Religious Services: More than 4 times per year    Active Member of [employer] or Organizations: No    Attends Archivist Meetings: Never    Marital Status: Married     Family History: The patient'sfamily history includes Angina in her paternal grandfather; Autoimmune disease in her maternal uncle; Bone cancer in her father; Diabetes in her paternal grandfather; Glaucoma in her maternal aunt and maternal grandmother; Heart disease in her brother; Heart disease (age of onset: 69) in her maternal grandfather; Heart failure in her mother; Hypertension in her brother, maternal aunt, maternal grandfather, and mother; Hypothyroidism in her mother; Kidney Stones in her maternal uncle; Melanoma (age of onset: 71) in her paternal uncle; Mental illness in her paternal aunt and paternal grandmother; Parkinson's disease in her brother and maternal aunt; Prostate cancer in her maternal uncle; Stroke in her maternal uncle  and mother.  ROS:   Please see the history of present illness.     All other systems reviewed and are negative.  EKGs/Labs/Other Studies Reviewed:    The following studies were reviewed today: Echo 03/02/21  1. Left ventricular ejection fraction by 3D volume is 62 %. The left  ventricle has normal function. The left ventricle has no regional wall  motion abnormalities. Left ventricular diastolic parameters were normal.   2. Right ventricular systolic function is normal. The right ventricular  size is normal. There is normal pulmonary artery systolic pressure.   3. The mitral valve is normal in structure. Mild mitral valve  regurgitation. No evidence of mitral stenosis.   4. The aortic valve is grossly normal. Aortic valve regurgitation is  trivial. No aortic stenosis is present.   EKG:  EKG is  ordered today.  The ekg ordered today demonstrates NSR with HR 66  Recent Labs: 11/01/2021: ALT 28; BUN 10; Creatinine, Ser 0.71; Hemoglobin 12.3; Platelets 129.0; Potassium 5.0; Sodium 143  Recent Lipid Panel    Component Value Date/Time   CHOL 243 (H) 02/12/2019 1023   TRIG 165.0 (H) 02/12/2019 1023   HDL 64.10 02/12/2019 1023   CHOLHDL 4 02/12/2019 1023   VLDL 33.0 02/12/2019 1023   LDLCALC 146 (H) 02/12/2019 1023     Risk Assessment/Calculations:    CHA2DS2-VASc Score = 3   This indicates a 3.2% annual risk of stroke. The patient's score is based upon: CHF History: 0 HTN History: 1 Diabetes History: 0 Stroke History: 0 Vascular Disease History: 0 Age Score: 1 Gender Score: 1    HYPERTENSION CONTROL Vitals:   09/26/22 1118 09/26/22 1148  BP: (!) 160/90 (!) 147/80    The patient's blood pressure is elevated above target today.  In order to address the patient's elevated BP: The blood pressure is usually elevated in clinic.  Blood pressures monitored at home have been optimal.  Physical Exam:    VS:  BP (!) 147/80   Pulse 66   Ht 5' 7"$  (1.702 m)    Wt 166 lb 9.6 oz (75.6 kg)   LMP 08/04/2009   SpO2 99%   BMI 26.09 kg/m     Wt Readings from Last 3 Encounters:  09/26/22 166 lb 9.6 oz (75.6 kg)  09/12/22 163 lb 8 oz (74.2 kg)  06/27/22 166 lb (75.3 kg)     GEN:  Well nourished, well developed in no acute distress HEENT: Normal NECK: No JVD; No carotid bruits CARDIAC: RRR, no murmurs, rubs, gallops RESPIRATORY:  Clear to auscultation without rales, wheezing or rhonchi  ABDOMEN: Soft, non-tender, non-distended MUSCULOSKELETAL:  No edema; No deformity  SKIN: Warm and dry NEUROLOGIC:  Alert and oriented x 3 PSYCHIATRIC:  Normal affect   ASSESSMENT:    1. Paroxysmal atrial fibrillation (HCC)   2. Hypercoagulable state due to paroxysmal atrial fibrillation (HCC)   3. Obstructive sleep apnea   4. Primary hypertension   5. Family history of early CAD    PLAN:    In order of problems listed above:  #Paroxysmal Afib: CHADs-vasc 3. Currently doing well and maintaining NSR.  -Continue dilt 127m XL daily -Continue dilt prn q4h -Continue xarelto 233mdaily  #HTN: States she has long standing white coat HTN. She will monitor at home. -Continue dilt 12068mL daily -Monitor BP at home   #Snoring: Sleep study with mild OSA. Now on nasal appliance.   #Family History of CAD: -Check Ca score -Prefers to not start cholesterol therapy if possible           Medication Adjustments/Labs and Tests Ordered: Current medicines are reviewed at length with the patient today.  Concerns regarding medicines are outlined above.  Orders Placed This Encounter  Procedures   CT CARDIAC SCORING (SELF PAY ONLY)   EKG 12-Lead   No orders of the defined types were placed in this encounter.   Patient Instructions  Medication Instructions:   Your physician recommends that you continue on your current medications as directed. Please refer to the Current Medication list given to you today.  *If you need a refill on your cardiac  medications before your next appointment, please call your pharmacy*   Testing/Procedures:  CARDIAC CT SCORING (SELF PAY)   Follow-Up: [facility] and [facility] are our [facility].  As part of our continuing mission to provide you with exceptional heart care, we have created designated Provider Care Teams.  These Care Teams include your primary Cardiologist (physician) and Advanced Practice Providers (APPs -  Physician Assistants and Nurse Practitioners) who all work together to provide you with the care you need, when you need it.  We recommend signing up for the patient portal called "MyChart".  Sign up information is provided on this After Visit Summary.  MyChart is used to connect with patients for Virtual Visits (Telemedicine).  Patients are able to view lab/test results, encounter notes, upcoming appointments, etc.  Non-urgent messages can be sent to your provider as well.   To learn more about what you can do with MyChart, go to [email or web address]  Your next appointment:   6 month(s)  Provider:   DR. PEMJohney Frame  Signed, HeaFreada BergeronD  09/26/2022 12:24 PM    [facility]

## 2022-09-26 ENCOUNTER — Encounter: Payer: Self-pay | Admitting: Cardiology

## 2022-09-26 ENCOUNTER — Ambulatory Visit: Payer: Medicare HMO | Attending: Cardiology | Admitting: Cardiology

## 2022-09-26 VITALS — BP 147/80 | HR 66 | Ht 67.0 in | Wt 166.6 lb

## 2022-09-26 DIAGNOSIS — D6869 Other thrombophilia: Secondary | ICD-10-CM | POA: Diagnosis not present

## 2022-09-26 DIAGNOSIS — Z8249 Family history of ischemic heart disease and other diseases of the circulatory system: Secondary | ICD-10-CM

## 2022-09-26 DIAGNOSIS — I10 Essential (primary) hypertension: Secondary | ICD-10-CM | POA: Diagnosis not present

## 2022-09-26 DIAGNOSIS — I480 Paroxysmal atrial fibrillation: Secondary | ICD-10-CM

## 2022-09-26 DIAGNOSIS — G4733 Obstructive sleep apnea (adult) (pediatric): Secondary | ICD-10-CM | POA: Diagnosis not present

## 2022-09-26 NOTE — Patient Instructions (Signed)
Medication Instructions:   Your physician recommends that you continue on your current medications as directed. Please refer to the Current Medication list given to you today.  *If you need a refill on your cardiac medications before your next appointment, please call your pharmacy*   Testing/Procedures:  CARDIAC CT SCORING (SELF PAY)   Follow-Up: At [facility], you and your health needs are our priority.  As part of our continuing mission to provide you with exceptional heart care, we have created designated Provider Care Teams.  These Care Teams include your primary Cardiologist (physician) and Advanced Practice Providers (APPs -  Physician Assistants and Nurse Practitioners) who all work together to provide you with the care you need, when you need it.  We recommend signing up for the patient portal called "MyChart".  Sign up information is provided on this After Visit Summary.  MyChart is used to connect with patients for Virtual Visits (Telemedicine).  Patients are able to view lab/test results, encounter notes, upcoming appointments, etc.  Non-urgent messages can be sent to your provider as well.   To learn more about what you can do with MyChart, go to [email or web address].    Your next appointment:   6 month(s)  Provider:   DR. Johney Frame

## 2022-10-24 ENCOUNTER — Other Ambulatory Visit: Payer: Self-pay | Admitting: Physician Assistant

## 2022-10-24 DIAGNOSIS — E876 Hypokalemia: Secondary | ICD-10-CM

## 2022-11-07 ENCOUNTER — Ambulatory Visit (HOSPITAL_BASED_OUTPATIENT_CLINIC_OR_DEPARTMENT_OTHER)
Admission: RE | Admit: 2022-11-07 | Discharge: 2022-11-07 | Disposition: A | Discharge status: Home / Self Care | Payer: Medicare HMO | Source: Ambulatory Visit | Attending: Cardiology | Admitting: Cardiology

## 2022-11-07 DIAGNOSIS — Z8249 Family history of ischemic heart disease and other diseases of the circulatory system: Secondary | ICD-10-CM

## 2022-11-09 DIAGNOSIS — M79602 Pain in left arm: Secondary | ICD-10-CM | POA: Diagnosis not present

## 2022-11-09 DIAGNOSIS — M79601 Pain in right arm: Secondary | ICD-10-CM | POA: Diagnosis not present

## 2022-12-05 ENCOUNTER — Encounter: Payer: Self-pay | Admitting: Physician Assistant

## 2022-12-05 ENCOUNTER — Ambulatory Visit (INDEPENDENT_AMBULATORY_CARE_PROVIDER_SITE_OTHER): Payer: Medicare HMO | Admitting: Physician Assistant

## 2022-12-05 VITALS — BP 160/90 | HR 72 | Temp 97.5°F | Ht 67.0 in | Wt 166.4 lb

## 2022-12-05 DIAGNOSIS — Z13220 Encounter for screening for lipoid disorders: Secondary | ICD-10-CM

## 2022-12-05 DIAGNOSIS — R030 Elevated blood-pressure reading, without diagnosis of hypertension: Secondary | ICD-10-CM | POA: Diagnosis not present

## 2022-12-05 DIAGNOSIS — M545 Low back pain, unspecified: Secondary | ICD-10-CM

## 2022-12-05 DIAGNOSIS — E2839 Other primary ovarian failure: Secondary | ICD-10-CM | POA: Diagnosis not present

## 2022-12-05 DIAGNOSIS — Z0001 Encounter for general adult medical examination with abnormal findings: Secondary | ICD-10-CM | POA: Diagnosis not present

## 2022-12-05 DIAGNOSIS — Z136 Encounter for screening for cardiovascular disorders: Secondary | ICD-10-CM

## 2022-12-05 LAB — LIPID PANEL
Cholesterol: 213 mg/dL — ABNORMAL HIGH (ref 0–200)
HDL: 62.4 mg/dL (ref >39.00)
LDL Cholesterol: 125 mg/dL — ABNORMAL HIGH (ref 0–99)
NonHDL: 150.98
Total CHOL/HDL Ratio: 3
Triglycerides: 132 mg/dL (ref 0.0–149.0)
VLDL: 26.4 mg/dL (ref 0.0–40.0)

## 2022-12-05 LAB — COMPREHENSIVE METABOLIC PANEL
ALT: 42 U/L — ABNORMAL HIGH (ref 0–35)
AST: 33 U/L (ref 0–37)
Albumin: 4.6 g/dL (ref 3.5–5.2)
Alkaline Phosphatase: 104 U/L (ref 39–117)
BUN: 11 mg/dL (ref 6–23)
CO2: 29 mEq/L (ref 19–32)
Calcium: 10 mg/dL (ref 8.4–10.5)
Chloride: 104 mEq/L (ref 96–112)
Creatinine, Ser: 0.77 mg/dL (ref 0.40–1.20)
GFR: 78.69 mL/min (ref >60.00)
Glucose, Bld: 96 mg/dL (ref 70–99)
Potassium: 4.2 mEq/L (ref 3.5–5.1)
Sodium: 141 mEq/L (ref 135–145)
Total Bilirubin: 0.5 mg/dL (ref 0.2–1.2)
Total Protein: 7.3 g/dL (ref 6.0–8.3)

## 2022-12-05 LAB — CBC WITH DIFFERENTIAL/PLATELET
Basophils Absolute: 0 10*3/uL (ref 0.0–0.1)
Basophils Relative: 0.9 % (ref 0.0–3.0)
Eosinophils Absolute: 0.1 10*3/uL (ref 0.0–0.7)
Eosinophils Relative: 2.8 % (ref 0.0–5.0)
HCT: 39.1 % (ref 36.0–46.0)
Hemoglobin: 13 g/dL (ref 12.0–15.0)
Lymphocytes Relative: 31 % (ref 12.0–46.0)
Lymphs Abs: 1.6 10*3/uL (ref 0.7–4.0)
MCHC: 33.2 g/dL (ref 30.0–36.0)
MCV: 86.8 fl (ref 78.0–100.0)
Monocytes Absolute: 0.5 10*3/uL (ref 0.1–1.0)
Monocytes Relative: 10 % (ref 3.0–12.0)
Neutro Abs: 2.8 10*3/uL (ref 1.4–7.7)
Neutrophils Relative %: 55.3 % (ref 43.0–77.0)
Platelets: 160 10*3/uL (ref 150.0–400.0)
RBC: 4.51 Mil/uL (ref 3.87–5.11)
RDW: 13 % (ref 11.5–15.5)
WBC: 5.2 10*3/uL (ref 4.0–10.5)

## 2022-12-05 MED ORDER — ACETAMINOPHEN-CODEINE 300-30 MG PO TABS: 1.0000 | ORAL_TABLET | ORAL | 0 refills | Status: DC | PRN

## 2022-12-05 NOTE — Patient Instructions (Addendum)
It was great to see you!  Trial Psyllium capsules for your cholesterol as we discussed  Please schedule a bone density test with our front desk  If any worsening back pain, go to ER  A referral has been placed for you to see one of our fantastic providers at [facility]. Someone from their office will be in touch soon regarding scheduling your appointment.  Their location:  [facility]  [address] on the 1st floor Phone number [phone] Fax [phone].   This location is across the street from the entrance to [employer] and in the same complex as the [facility]   Please go to the lab for blood work.   Our office will call you with your results unless you have chosen to receive results via MyChart.  If your blood work is normal we will follow-up each year for physicals and as scheduled for chronic medical problems.  If anything is abnormal we will treat accordingly and get you in for a follow-up.  Take care,  Lelon Mast

## 2022-12-05 NOTE — Progress Notes (Signed)
Subjective:    Melinda [name] is a 72 y.o. female and is here for a comprehensive physical exam.  HPI  Health Maintenance Due  Topic Date Due   MAMMOGRAM  03/26/2014   DEXA SCAN  Never done    Acute Concerns: Back pain One week ago was bending over to tend to her cat and had sudden severe pain Pain is in right glute and feels like an intense spasm Last night didn't seem to ease up Putting CBD topical and biofreeze on area Taking tylenol prn Has hx of this in the past but usually on left side Denies: fever, bony tenderness, weakness in b/l legs, incontinence, saddle anesthesia  Chronic Issues: PAF Currently taking cardizem 120 mg daily and prn diltiazem 30 mg. At home blood pressure readings are: not regularly checked. Patient denies chest pain, SOB, blurred vision, dizziness, unusual headaches, lower leg swelling. Patient is compliant with medication. Denies excessive caffeine intake, stimulant usage, excessive alcohol intake, or increase in salt consumption.  BP Readings from Last 3 Encounters:  12/05/22 (!) 160/90  09/26/22 (!) 147/80  09/12/22 120/70    Health Maintenance: Immunizations -- UTD Colonoscopy -- UTD on fecal occult card Mammogram -- declines today - too painful PAP -- n/a Bone Density -- ok to order Diet -- overall balanced,  Exercise -- walking regularly  Sleep habits -- overall stable when  Mood -- stable overall  UTD with dentist? - yes UTD with eye doctor? - yes  Weight history: Wt Readings from Last 10 Encounters:  12/05/22 166 lb 6.1 oz (75.5 kg)  09/26/22 166 lb 9.6 oz (75.6 kg)  09/12/22 163 lb 8 oz (74.2 kg)  06/27/22 166 lb (75.3 kg)  06/17/22 164 lb (74.4 kg)  05/20/22 164 lb 14.5 oz (74.8 kg)  05/13/22 165 lb (74.8 kg)  02/28/22 162 lb 9.6 oz (73.8 kg)  02/16/22 162 lb 8 oz (73.7 kg)  12/28/21 161 lb 3.2 oz (73.1 kg)   Body mass index is 26.06 kg/m. Patient's last menstrual period was 08/04/2009.  Alcohol use:  reports  that she does not currently use alcohol after a past usage of about 1.0 standard drink of alcohol per week.  Tobacco use:  Tobacco Use: Low Risk  (12/05/2022)   Patient History    Smoking Tobacco Use: Never    Smokeless Tobacco Use: Never    Passive Exposure: Not on file   Eligible for lung cancer screening? no     12/05/2022    1:11 PM  Depression screen PHQ 2/9  Decreased Interest 0  Down, Depressed, Hopeless 0  PHQ - 2 Score 0     Other providers/specialists: Patient Care Team: Jarold Motto, [place] as PCP - General (Physician Assistant) End, Cristal Deer, MD as Consulting Physician (Cardiology) Newman Pies, MD as Consulting Physician (Otolaryngology) Edwinna Areola, DO as Consulting Physician (Obstetrics and Gynecology) Helane Rima, DO (Family Medicine)    PMHx, SurgHx, SocialHx, Medications, and Allergies were reviewed in the Visit Navigator and updated as appropriate.   Past Medical History:  Diagnosis Date   Arthritis    Back problem    Cataracts, bilateral    Chicken pox    Disorder of adrenal gland (HCC)    Disorders of sulfur-bearing amino-acid metabolism, unspecified (HCC)    Displaced fracture of base of unspecified metacarpal bone, initial encounter for closed fracture    Gallstones    Hyperlipidemia    Hypertension    Insomnia    Iron deficiency anemia,  unspecified    mostly vegetarian   Measles    Mumps    Sciatica    Sensitivity to medication    Sprain of unspecified ligament of right ankle, subsequent encounter    Vitamin D deficiency, unspecified     No past surgical history on file.   Family History  Problem Relation Age of Onset   Hypertension Mother    Hypothyroidism Mother    Heart failure Mother    Stroke Mother    Bone cancer Father    Heart disease Maternal Grandfather 76   Hypertension Maternal Grandfather    Glaucoma Maternal Grandmother    Mental illness Paternal Grandmother    Diabetes Paternal Grandfather    Angina  Paternal Grandfather    Heart disease Brother    Hypertension Brother    Melanoma Paternal Uncle 57   Mental illness Paternal Aunt    Autoimmune disease Maternal Uncle    Prostate cancer Maternal Uncle    Kidney Stones Maternal Uncle    Stroke Maternal Uncle    Parkinson's disease Maternal Aunt    Hypertension Maternal Aunt    Glaucoma Maternal Aunt    Parkinson's disease Brother     Social History   Tobacco Use   Smoking status: Never   Smokeless tobacco: Never   Tobacco comments:    Never smoke 12/28/21  Substance Use Topics   Alcohol use: Not Currently    Alcohol/week: 1.0 standard drink of alcohol    Types: 1 Standard drinks or equivalent per week    Comment: very small amount occasionally 09/29/21   Drug use: No    Review of Systems:   Review of Systems  Constitutional:  Negative for chills, fever, malaise/fatigue and weight loss.  HENT:  Negative for hearing loss, sinus pain and sore throat.   Respiratory:  Negative for cough and hemoptysis.   Cardiovascular:  Negative for chest pain, palpitations, leg swelling and PND.  Gastrointestinal:  Negative for abdominal pain, constipation, diarrhea, heartburn, nausea and vomiting.  Genitourinary:  Negative for dysuria, frequency and urgency.  Musculoskeletal:  Negative for back pain, myalgias and neck pain.  Skin:  Negative for itching and rash.  Neurological:  Negative for dizziness, tingling, seizures and headaches.  Endo/Heme/Allergies:  Negative for polydipsia.  Psychiatric/Behavioral:  Negative for depression. The patient is not nervous/anxious.     Objective:   BP (!) 160/90 (BP Location: Left Arm, Patient Position: Sitting, Cuff Size: Normal)   Pulse 72   Temp (!) 97.5 F (36.4 C) (Temporal)   Ht 5\' 7"  (1.702 m)   Wt 166 lb 6.1 oz (75.5 kg)   LMP 08/04/2009   SpO2 99%   BMI 26.06 kg/m  Body mass index is 26.06 kg/m.   General Appearance:    Alert, cooperative, no distress, appears stated age  Head:     Normocephalic, without obvious abnormality, atraumatic  Eyes:    PERRL, conjunctiva/corneas clear, EOM's intact, fundi    benign, both eyes  Ears:    Normal TM's and external ear canals, both ears  Nose:   Nares normal, septum midline, mucosa normal, no drainage    or sinus tenderness  Throat:   Lips, mucosa, and tongue normal; teeth and gums normal  Neck:   Supple, symmetrical, trachea midline, no adenopathy;    thyroid:  no enlargement/tenderness/nodules; no carotid   bruit or JVD  Back:     Symmetric, no curvature, ROM normal, no CVA tenderness Tenderness to paraspinal area of R  lumbar region and SI joint  Lungs:     Clear to auscultation bilaterally, respirations unlabored  Chest Wall:    No tenderness or deformity   Heart:    Regular rate and rhythm, S1 and S2 normal, no murmur, rub or gallop  Breast Exam:    Deferred  Abdomen:     Soft, non-tender, bowel sounds active all four quadrants,    no masses, no organomegaly  Genitalia:    Deferred  Extremities:   Extremities normal, atraumatic, no cyanosis or edema   Pulses:   2+ and symmetric all extremities  Skin:   Skin color, texture, turgor normal, no rashes or lesions  Lymph nodes:   Cervical, supraclavicular, and axillary nodes normal  Neurologic:   CNII-XII intact, normal strength, sensation and reflexes    throughout    Assessment/Plan:   Encounter for general adult medical examination with abnormal findings Today patient counseled on age appropriate routine health concerns for screening and prevention, each reviewed and up to date or declined. Immunizations reviewed and up to date or declined. Labs ordered and reviewed. Risk factors for depression reviewed and negative. Hearing function and visual acuity are intact. ADLs screened and addressed as needed. Functional ability and level of safety reviewed and appropriate. Education, counseling and referrals performed based on assessed risks today. Patient provided with a copy of  personalized plan for preventive services.  Acute right-sided low back pain without sciatica No red flags Suspect lumbar strain She is going to see sports medicine tomorrow for further evaluation Unable to tolerate NSAIDs due to blood thinner use Has tolerated Tylenol #3 in the past and did well with this - would like a short script of this to help manage severe pain, this is not unreasonable If any worsening sx in the meantime, she was instructed to go to the ER  Estrogen deficiency Agreeable to DEXA scan  Elevated blood pressure reading Above goal No evidence of end-organ damage Suspect elevated due to pain Continue cardizem as prescribed and recommend home monitoring  If new/worsening sx or lack of improvement, recommend she reach out to cardiology  Encounter for lipid screening for cardiovascular disease Update lipid panel    Jarold Motto, PA-C [facility]

## 2022-12-06 ENCOUNTER — Ambulatory Visit: Payer: Medicare HMO | Admitting: Sports Medicine

## 2022-12-06 ENCOUNTER — Ambulatory Visit (INDEPENDENT_AMBULATORY_CARE_PROVIDER_SITE_OTHER): Payer: Medicare HMO

## 2022-12-06 VITALS — HR 80 | Ht 67.0 in | Wt 166.0 lb

## 2022-12-06 DIAGNOSIS — M545 Low back pain, unspecified: Secondary | ICD-10-CM

## 2022-12-06 DIAGNOSIS — M5136 Other intervertebral disc degeneration, lumbar region: Secondary | ICD-10-CM | POA: Diagnosis not present

## 2022-12-06 MED ORDER — CYCLOBENZAPRINE HCL 5 MG PO TABS
5.0000 mg | ORAL_TABLET | Freq: Every day | ORAL | 0 refills | Status: DC
Start: 2022-12-06 — End: 2023-03-17

## 2022-12-06 NOTE — Progress Notes (Signed)
[name] D.[facility] [address] [place] [address] Phone: [phone]   Assessment and Plan:     1. Acute right-sided low back pain without sciatica -Acute, uncomplicated, initial sports medicine visit - Consistent with acute strain of lumbar musculature causing back spasms without radicular symptoms - X-ray viewed in clinic.  My interpretation: No acute fracture or dislocation.  Unremarkable imaging - Recommend starting Flexeril 5 to 10 mg 1-2 times a day as needed for muscle spasms - Recommend using topical Voltaren gel over areas of pain - Start Tylenol 500 to 1000 mg tablets 2-3 times a day for day-to-day pain relief - Do not recommend NSAID or prednisone use due to history of paroxysmal atrial fibrillation on chronic anticoagulation with Xarelto - Patient plays in the [employer].  Recommend rest for 1 week as prolonged sitting and performing could flare symptoms - DG Lumbar Spine 2-3 Views; Future    Pertinent previous records reviewed include family medicine note 12/06/2022   Follow Up: 1 week for reevaluation.  Could consider OMT if symptoms improving   Subjective:   I, Melinda [name], am serving as a Neurosurgeon for Doctor Richardean Sale  Chief Complaint: low back pain   HPI:   12/06/22 Patient is a 72 year old female complaining of low back pain. Patient states One week ago was bending over to tend to her cat and had sudden severe pain , pain is in right glute and feels like an intense spasm, Last night didn't seem to ease up Putting CBD topical and biofreeze on area, Taking tylenol prn. Has hx of this in the past but usually on left side,tylenol 3 helped a lot , bilateral low back pain , pain radiates down to her butt cheek she wasn't able to bow    Relevant Historical Information: Paroxysmal atrial fibrillation on chronic anticoagulation with Xarelto  Additional pertinent review of systems negative.   Current Outpatient  Medications:    acetaminophen-codeine (TYLENOL #3) 300-30 MG tablet, Take 1 tablet by mouth every 4 (four) hours as needed for moderate pain., Disp: 30 tablet, Rfl: 0   Acetylcysteine (NAC) 600 MG CAPS, Take 1 capsule by mouth daily in the afternoon., Disp: , Rfl:    Ascorbic Acid (VITAMIN C PO), Take 1 tablet by mouth daily in the afternoon. Vitamin C and Bio-Quercetin Phytosome, Disp: , Rfl:    Cholecalciferol (VITAMIN D) 125 MCG (5000 UT) CAPS, Take 1 capsule by mouth. Five times a week, Disp: , Rfl:    Digestive Enzymes (DIGESTIVE ENZYME PO), Take 1 tablet by mouth daily in the afternoon. Primary Digest- Brand, Disp: , Rfl:    diltiazem (CARDIZEM CD) 120 MG 24 hr capsule, Take 1 capsule by mouth once daily, Disp: 30 capsule, Rfl: 6   diltiazem (CARDIZEM) 30 MG tablet, Take 1 tablet every 4 hours AS NEEDED for heart rate >100, Disp: 30 tablet, Rfl: 1   Ferrous Sulfate (IRON PO), Take 1 tablet by mouth daily in the afternoon., Disp: , Rfl:    GLYCINE PO, Take 500 mg by mouth. Take 4 tabs at bedtime on an empty stomach or open and melt . May repeat in the middle of the night if needed, Disp: , Rfl:    magnesium gluconate (MAGONATE) 500 MG tablet, Take 500 mg by mouth daily in the afternoon., Disp: , Rfl:    MAGNESIUM GLYCINATE PO, Take 1 tablet by mouth daily., Disp: , Rfl:    Multiple Vitamins-Minerals (MULTIVITAMIN WOMEN  PO), Take 1 tablet by mouth daily in the afternoon. Vitamin Code 50 and wiser, Disp: , Rfl:    NON FORMULARY, P 5 P 50 mg  -Taking 1 tablet by mouth once a day, Disp: , Rfl:    Nutritional Supplements (DETOX ENZYME FORMULA PO), Take by mouth. Reported on 01/21/2016, Disp: , Rfl:    OVER THE COUNTER MEDICATION, Take 4 tablets by mouth daily., Disp: , Rfl:    OVER THE COUNTER MEDICATION, Take 1 capsule by mouth daily in the afternoon. Unique E-, Disp: , Rfl:    OVER THE COUNTER MEDICATION, Take 1 tablet by mouth daily in the afternoon. MacuGuard Ocular Support, Disp: , Rfl:    OVER  THE COUNTER MEDICATION, Take 1 tablet by mouth daily in the afternoon. LV-GB Complex, Disp: , Rfl:    OVER THE COUNTER MEDICATION, Take 1 tablet by mouth daily. Hair Skin and Nails, Disp: , Rfl:    potassium chloride (KLOR-CON) 10 MEQ tablet, Take 1 tablet by mouth twice daily (Patient taking differently: daily.), Disp: 60 tablet, Rfl: 0   Pyridoxine HCl (VITAMIN B-6 PO), Take 1 capsule by mouth daily in the afternoon. P5P, Disp: , Rfl:    QUERCETIN PO, Take 1 tablet by mouth daily in the afternoon., Disp: , Rfl:    UNABLE TO FIND, CBD oil 1000mg , Disp: , Rfl:    VITAMIN K PO, Take 1 tablet by mouth daily. Potassium, Disp: , Rfl:    XARELTO 20 MG TABS tablet, TAKE 1 TABLET BY MOUTH ONCE DAILY WITH SUPPER, Disp: 30 tablet, Rfl: 6   Objective:     Vitals:   12/06/22 1420  Pulse: 80  SpO2: 99%  Weight: 166 lb (75.3 kg)  Height: 5\' 7"  (1.702 m)      Body mass index is 26 kg/m.    Physical Exam:    Gen: Appears well, nad, nontoxic and pleasant Psych: Alert and oriented, appropriate mood and affect Neuro: sensation intact, strength is 5/5 in upper and lower extremities, muscle tone wnl Skin: no susupicious lesions or rashes  Back - Normal skin, Spine with normal alignment and no deformity.   No tenderness to vertebral process palpation.   Bilateral lumbar paraspinous muscles are moderately tender and without spasm NTTP gluteal musculature Straight leg raise negative Trendelenberg negative Piriformis Test negative  Low back pain worse with lumbar flexion and relieved with extension  Electronically signed by:  [name] [facility] 2:45 PM 12/06/22

## 2022-12-06 NOTE — Patient Instructions (Addendum)
Good to see you  Flexeril 5-10 mg 1-2 time per day as needed for muscle spasm Voltaren gel over areas of pain  Low back HEP  Tylenol [phone] mg 2-3 times a day for pain relief  1 week follow up

## 2022-12-12 DIAGNOSIS — H6123 Impacted cerumen, bilateral: Secondary | ICD-10-CM | POA: Diagnosis not present

## 2022-12-12 NOTE — Progress Notes (Unsigned)
[name] D.[facility] [address] [place] [address] Phone: [phone]   Assessment and Plan:     There are no diagnoses linked to this encounter.  ***   Pertinent previous records reviewed include ***   Follow Up: ***     Subjective:   I, Melinda [name], am serving as a Neurosurgeon for Doctor Richardean Sale   Chief Complaint: low back pain    HPI:    12/06/22 Patient is a 72 year old female complaining of low back pain. Patient states One week ago was bending over to tend to her cat and had sudden severe pain , pain is in right glute and feels like an intense spasm, Last night didn't seem to ease up Putting CBD topical and biofreeze on area, Taking tylenol prn. Has hx of this in the past but usually on left side,tylenol 3 helped a lot , bilateral low back pain , pain radiates down to her butt cheek she wasn't able to bow    12/13/2022 Patient states    Relevant Historical Information: Paroxysmal atrial fibrillation on chronic anticoagulation with Xarelto  Additional pertinent review of systems negative.   Current Outpatient Medications:    acetaminophen-codeine (TYLENOL #3) 300-30 MG tablet, Take 1 tablet by mouth every 4 (four) hours as needed for moderate pain., Disp: 30 tablet, Rfl: 0   Acetylcysteine (NAC) 600 MG CAPS, Take 1 capsule by mouth daily in the afternoon., Disp: , Rfl:    Ascorbic Acid (VITAMIN C PO), Take 1 tablet by mouth daily in the afternoon. Vitamin C and Bio-Quercetin Phytosome, Disp: , Rfl:    Cholecalciferol (VITAMIN D) 125 MCG (5000 UT) CAPS, Take 1 capsule by mouth. Five times a week, Disp: , Rfl:    cyclobenzaprine (FLEXERIL) 5 MG tablet, Take 1 tablet (5 mg total) by mouth at bedtime., Disp: 40 tablet, Rfl: 0   Digestive Enzymes (DIGESTIVE ENZYME PO), Take 1 tablet by mouth daily in the afternoon. Primary Digest- Brand, Disp: , Rfl:    diltiazem (CARDIZEM CD) 120 MG 24 hr capsule, Take 1 capsule by  mouth once daily, Disp: 30 capsule, Rfl: 6   diltiazem (CARDIZEM) 30 MG tablet, Take 1 tablet every 4 hours AS NEEDED for heart rate >100, Disp: 30 tablet, Rfl: 1   Ferrous Sulfate (IRON PO), Take 1 tablet by mouth daily in the afternoon., Disp: , Rfl:    GLYCINE PO, Take 500 mg by mouth. Take 4 tabs at bedtime on an empty stomach or open and melt . May repeat in the middle of the night if needed, Disp: , Rfl:    magnesium gluconate (MAGONATE) 500 MG tablet, Take 500 mg by mouth daily in the afternoon., Disp: , Rfl:    MAGNESIUM GLYCINATE PO, Take 1 tablet by mouth daily., Disp: , Rfl:    Multiple Vitamins-Minerals (MULTIVITAMIN WOMEN PO), Take 1 tablet by mouth daily in the afternoon. Vitamin Code 50 and wiser, Disp: , Rfl:    NON FORMULARY, P 5 P 50 mg  -Taking 1 tablet by mouth once a day, Disp: , Rfl:    Nutritional Supplements (DETOX ENZYME FORMULA PO), Take by mouth. Reported on 01/21/2016, Disp: , Rfl:    OVER THE COUNTER MEDICATION, Take 4 tablets by mouth daily., Disp: , Rfl:    OVER THE COUNTER MEDICATION, Take 1 capsule by mouth daily in the afternoon. Unique E-, Disp: , Rfl:    OVER THE COUNTER MEDICATION, Take 1 tablet  by mouth daily in the afternoon. MacuGuard Ocular Support, Disp: , Rfl:    OVER THE COUNTER MEDICATION, Take 1 tablet by mouth daily in the afternoon. LV-GB Complex, Disp: , Rfl:    OVER THE COUNTER MEDICATION, Take 1 tablet by mouth daily. Hair Skin and Nails, Disp: , Rfl:    potassium chloride (KLOR-CON) 10 MEQ tablet, Take 1 tablet by mouth twice daily (Patient taking differently: daily.), Disp: 60 tablet, Rfl: 0   Pyridoxine HCl (VITAMIN B-6 PO), Take 1 capsule by mouth daily in the afternoon. P5P, Disp: , Rfl:    QUERCETIN PO, Take 1 tablet by mouth daily in the afternoon., Disp: , Rfl:    UNABLE TO FIND, CBD oil 1000mg , Disp: , Rfl:    VITAMIN K PO, Take 1 tablet by mouth daily. Potassium, Disp: , Rfl:    XARELTO 20 MG TABS tablet, TAKE 1 TABLET BY MOUTH ONCE DAILY  WITH SUPPER, Disp: 30 tablet, Rfl: 6   Objective:     There were no vitals filed for this visit.    There is no height or weight on file to calculate BMI.    Physical Exam:    ***   Electronically signed by:  [name] [facility] 12:10 PM 12/12/22

## 2022-12-13 ENCOUNTER — Ambulatory Visit: Payer: Medicare HMO | Admitting: Sports Medicine

## 2022-12-13 VITALS — BP 130/78 | HR 76 | Ht 67.0 in | Wt 161.0 lb

## 2022-12-13 DIAGNOSIS — M9904 Segmental and somatic dysfunction of sacral region: Secondary | ICD-10-CM | POA: Diagnosis not present

## 2022-12-13 DIAGNOSIS — M9905 Segmental and somatic dysfunction of pelvic region: Secondary | ICD-10-CM | POA: Diagnosis not present

## 2022-12-13 DIAGNOSIS — M545 Low back pain, unspecified: Secondary | ICD-10-CM

## 2022-12-13 DIAGNOSIS — M9903 Segmental and somatic dysfunction of lumbar region: Secondary | ICD-10-CM | POA: Diagnosis not present

## 2022-12-13 NOTE — Patient Instructions (Addendum)
Good to see you PT referral As needed follow up

## 2022-12-20 DIAGNOSIS — M79601 Pain in right arm: Secondary | ICD-10-CM | POA: Diagnosis not present

## 2022-12-20 DIAGNOSIS — M79602 Pain in left arm: Secondary | ICD-10-CM | POA: Diagnosis not present

## 2022-12-20 DIAGNOSIS — M545 Low back pain, unspecified: Secondary | ICD-10-CM | POA: Diagnosis not present

## 2022-12-22 ENCOUNTER — Ambulatory Visit (INDEPENDENT_AMBULATORY_CARE_PROVIDER_SITE_OTHER)
Admission: RE | Admit: 2022-12-22 | Discharge: 2022-12-22 | Disposition: A | Discharge status: Home / Self Care | Payer: Medicare HMO | Source: Ambulatory Visit | Attending: Physician Assistant | Admitting: Physician Assistant

## 2022-12-22 ENCOUNTER — Other Ambulatory Visit (HOSPITAL_COMMUNITY): Payer: Self-pay | Admitting: Physician Assistant

## 2022-12-22 DIAGNOSIS — E2839 Other primary ovarian failure: Secondary | ICD-10-CM | POA: Diagnosis not present

## 2022-12-29 ENCOUNTER — Encounter: Payer: Self-pay | Admitting: Physician Assistant

## 2022-12-29 ENCOUNTER — Ambulatory Visit (INDEPENDENT_AMBULATORY_CARE_PROVIDER_SITE_OTHER): Payer: Medicare HMO | Admitting: Physician Assistant

## 2022-12-29 ENCOUNTER — Other Ambulatory Visit: Payer: Self-pay | Admitting: Physician Assistant

## 2022-12-29 VITALS — BP 120/80 | HR 57 | Temp 97.7°F | Ht 67.0 in | Wt 160.0 lb

## 2022-12-29 DIAGNOSIS — M810 Age-related osteoporosis without current pathological fracture: Secondary | ICD-10-CM | POA: Diagnosis not present

## 2022-12-29 DIAGNOSIS — R748 Abnormal levels of other serum enzymes: Secondary | ICD-10-CM

## 2022-12-29 LAB — COMPREHENSIVE METABOLIC PANEL
ALT: 79 U/L — ABNORMAL HIGH (ref 0–35)
AST: 81 U/L — ABNORMAL HIGH (ref 0–37)
Albumin: 4.4 g/dL (ref 3.5–5.2)
Alkaline Phosphatase: 103 U/L (ref 39–117)
BUN: 12 mg/dL (ref 6–23)
CO2: 27 mEq/L (ref 19–32)
Calcium: 9.9 mg/dL (ref 8.4–10.5)
Chloride: 105 mEq/L (ref 96–112)
Creatinine, Ser: 0.82 mg/dL (ref 0.40–1.20)
GFR: 72.93 mL/min (ref >60.00)
Glucose, Bld: 91 mg/dL (ref 70–99)
Potassium: 4 mEq/L (ref 3.5–5.1)
Sodium: 139 mEq/L (ref 135–145)
Total Bilirubin: 0.5 mg/dL (ref 0.2–1.2)
Total Protein: 7 g/dL (ref 6.0–8.3)

## 2022-12-29 LAB — VITAMIN D 25 HYDROXY (VIT D DEFICIENCY, FRACTURES): VITD: 67.64 ng/mL (ref 30.00–100.00)

## 2022-12-29 NOTE — Progress Notes (Signed)
Melinda [name] is a 72 y.o. female here for a follow up of a pre-existing problem.  History of Present Illness:   Chief Complaint  Patient presents with   Osteoporosis    Discuss results and medications    HPI  Osteoporosis: Her last bone density was 12/22/2022 and revealed osteoporosis.  She is taking Bone Up, 2 capsules 2x a day.  She has been going to PT for her back and is interested in trying weight training.  She prefers to try strength training first before starting medications.  She would like an referral to [facility] for strength training. Has a colleague that goes there and has good experience with him.  She continues to experience chronic back pain.  In PT she has been doing many back exercises that have been helping a bit.  Her next PT appointment is next week.   We discussed the option of starting Fosamax.   Past Medical History:  Diagnosis Date   Arthritis    Back problem    Cataracts, bilateral    Chicken pox    Disorder of adrenal gland (HCC)    Disorders of sulfur-bearing amino-acid metabolism, unspecified (HCC)    Displaced fracture of base of unspecified metacarpal bone, initial encounter for closed fracture    Gallstones    Hyperlipidemia    Hypertension    Insomnia    Iron deficiency anemia, unspecified    mostly vegetarian   Measles    Mumps    Sciatica    Sensitivity to medication    Sprain of unspecified ligament of right ankle, subsequent encounter    Vitamin D deficiency, unspecified      Social History   Tobacco Use   Smoking status: Never   Smokeless tobacco: Never   Tobacco comments:    Never smoke 12/28/21  Substance Use Topics   Alcohol use: Not Currently    Alcohol/week: 1.0 standard drink of alcohol    Types: 1 Standard drinks or equivalent per week    Comment: very small amount occasionally 09/29/21   Drug use: No    No past surgical history on file.  Family History  Problem Relation Age of Onset   Hypertension  Mother    Hypothyroidism Mother    Heart failure Mother    Stroke Mother    Bone cancer Father    Heart disease Maternal Grandfather 14   Hypertension Maternal Grandfather    Glaucoma Maternal Grandmother    Mental illness Paternal Grandmother    Diabetes Paternal Grandfather    Angina Paternal Grandfather    Heart disease Brother    Hypertension Brother    Melanoma Paternal Uncle 78   Mental illness Paternal Aunt    Autoimmune disease Maternal Uncle    Prostate cancer Maternal Uncle    Kidney Stones Maternal Uncle    Stroke Maternal Uncle    Parkinson's disease Maternal Aunt    Hypertension Maternal Aunt    Glaucoma Maternal Aunt    Parkinson's disease Brother     Allergies  Allergen Reactions   Pseudoephedrine Other (See Comments)    Hyper active     Current Medications:   Current Outpatient Medications:    acetaminophen-codeine (TYLENOL #3) 300-30 MG tablet, Take 1 tablet by mouth every 4 (four) hours as needed for moderate pain., Disp: 30 tablet, Rfl: 0   Acetylcysteine (NAC) 600 MG CAPS, Take 1 capsule by mouth daily in the afternoon., Disp: , Rfl:    Ascorbic Acid (VITAMIN  C PO), Take 1 tablet by mouth daily in the afternoon. Vitamin C and Bio-Quercetin Phytosome, Disp: , Rfl:    Cholecalciferol (VITAMIN D) 125 MCG (5000 UT) CAPS, Take 1 capsule by mouth. Five times a week, Disp: , Rfl:    cyclobenzaprine (FLEXERIL) 5 MG tablet, Take 1 tablet (5 mg total) by mouth at bedtime., Disp: 40 tablet, Rfl: 0   Digestive Enzymes (DIGESTIVE ENZYME PO), Take 1 tablet by mouth daily in the afternoon. Primary Digest- Brand, Disp: , Rfl:    diltiazem (CARDIZEM CD) 120 MG 24 hr capsule, Take 1 capsule by mouth once daily, Disp: 30 capsule, Rfl: 0   diltiazem (CARDIZEM) 30 MG tablet, Take 1 tablet every 4 hours AS NEEDED for heart rate >100, Disp: 30 tablet, Rfl: 1   Ferrous Sulfate (IRON PO), Take 1 tablet by mouth daily in the afternoon., Disp: , Rfl:    GLYCINE PO, Take 500 mg by  mouth. Take 4 tabs at bedtime on an empty stomach or open and melt . May repeat in the middle of the night if needed, Disp: , Rfl:    magnesium gluconate (MAGONATE) 500 MG tablet, Take 500 mg by mouth daily in the afternoon., Disp: , Rfl:    MAGNESIUM GLYCINATE PO, Take 1 tablet by mouth daily., Disp: , Rfl:    Multiple Vitamins-Minerals (MULTIVITAMIN WOMEN PO), Take 1 tablet by mouth daily in the afternoon. Vitamin Code 50 and wiser, Disp: , Rfl:    NON FORMULARY, P 5 P 50 mg  -Taking 1 tablet by mouth once a day, Disp: , Rfl:    Nutritional Supplements (DETOX ENZYME FORMULA PO), Take by mouth. Reported on 01/21/2016, Disp: , Rfl:    OVER THE COUNTER MEDICATION, Take 4 tablets by mouth daily., Disp: , Rfl:    OVER THE COUNTER MEDICATION, Take 1 capsule by mouth daily in the afternoon. Unique E-, Disp: , Rfl:    OVER THE COUNTER MEDICATION, Take 1 tablet by mouth daily in the afternoon. MacuGuard Ocular Support, Disp: , Rfl:    OVER THE COUNTER MEDICATION, Take 1 tablet by mouth daily in the afternoon. LV-GB Complex, Disp: , Rfl:    OVER THE COUNTER MEDICATION, Take 1 tablet by mouth daily. Hair Skin and Nails, Disp: , Rfl:    OVER THE COUNTER MEDICATION, Take 2 capsules by mouth 2 (two) times daily. Bone Up, Disp: , Rfl:    potassium chloride (KLOR-CON) 10 MEQ tablet, Take 1 tablet by mouth twice daily (Patient taking differently: daily.), Disp: 60 tablet, Rfl: 0   Pyridoxine HCl (VITAMIN B-6 PO), Take 1 capsule by mouth daily in the afternoon. P5P, Disp: , Rfl:    QUERCETIN PO, Take 1 tablet by mouth daily in the afternoon., Disp: , Rfl:    UNABLE TO FIND, CBD oil 1000mg , Disp: , Rfl:    VITAMIN K PO, Take 1 tablet by mouth daily. Potassium, Disp: , Rfl:    XARELTO 20 MG TABS tablet, TAKE 1 TABLET BY MOUTH ONCE DAILY WITH SUPPER, Disp: 30 tablet, Rfl: 0   Review of Systems:   Review of Systems  Musculoskeletal:  Positive for back pain.    Vitals:   Vitals:   12/29/22 0908  BP: 120/80   Pulse: (!) 57  Temp: 97.7 F (36.5 C)  TempSrc: Temporal  SpO2: 99%  Weight: 160 lb (72.6 kg)  Height: 5\' 7"  (1.702 m)     Body mass index is 25.06 kg/m.  Physical Exam:   Physical  Exam Constitutional:      Appearance: Normal appearance. She is well-developed.  HENT:     Head: Normocephalic and atraumatic.  Eyes:     General: Lids are normal.     Extraocular Movements: Extraocular movements intact.     Conjunctiva/sclera: Conjunctivae normal.  Pulmonary:     Effort: Pulmonary effort is normal.  Musculoskeletal:        General: Normal range of motion.     Cervical back: Normal range of motion and neck supple.  Skin:    General: Skin is warm and dry.  Neurological:     Mental Status: She is alert and oriented to person, place, and time.  Psychiatric:        Attention and Perception: Attention and perception normal.        Mood and Affect: Mood normal.        Behavior: Behavior normal.        Thought Content: Thought content normal.        Judgment: Judgment normal.     Assessment and Plan:   Age-related osteoporosis without current pathological fracture Discussed treatment options and reviewed DXA results She is hesitant to take prescription medications We will still update blood work today to assess Vitamin D I have placed referral for strength trainer as she has requested Handouts on osteoporosis and fosamax provided   I,Rachel Rivera,acting as a Neurosurgeon for [employer], PA.,have documented all relevant documentation on the behalf of Jarold Motto, PA,as directed by  Jarold Motto, PA while in the presence of Jarold Motto, [place].  I, Jarold Motto, [place], have reviewed all documentation for this visit. The documentation on 12/29/22 for the exam, diagnosis, procedures, and orders are all accurate and complete.  I spent a total of 34 minutes on this visit, today 12/29/22, which included reviewing DXA results, ordering tests, discussing plan of care with  patient and using shared-decision making on next steps, refilling medications, and documenting the findings in the note.   Jarold Motto, PA-C

## 2022-12-29 NOTE — Patient Instructions (Signed)
It was great to see you!  Consider Fosamax -- see handout -- I will be in touch with your lab results and we can decide at that time if you want to move forward with medications  I will send referral to the Strength [facility] you requested however I recommend that you go ahead and reach out to him as well to schedule an appointment  Please discuss with your physical therapist about your new diagnosis of osteoporosis  Take care,  Jarold Motto PA-C

## 2022-12-30 ENCOUNTER — Encounter: Payer: Self-pay | Admitting: Physician Assistant

## 2022-12-30 NOTE — Telephone Encounter (Signed)
Please see pt msg and advise if you would like pt to schedule VV or in office visit

## 2023-01-03 ENCOUNTER — Other Ambulatory Visit: Payer: Self-pay | Admitting: Physician Assistant

## 2023-01-03 DIAGNOSIS — M79602 Pain in left arm: Secondary | ICD-10-CM | POA: Diagnosis not present

## 2023-01-03 DIAGNOSIS — M545 Low back pain, unspecified: Secondary | ICD-10-CM | POA: Diagnosis not present

## 2023-01-03 DIAGNOSIS — M79601 Pain in right arm: Secondary | ICD-10-CM | POA: Diagnosis not present

## 2023-01-05 ENCOUNTER — Other Ambulatory Visit (INDEPENDENT_AMBULATORY_CARE_PROVIDER_SITE_OTHER): Payer: Medicare HMO

## 2023-01-05 DIAGNOSIS — R748 Abnormal levels of other serum enzymes: Secondary | ICD-10-CM

## 2023-01-05 LAB — HEPATIC FUNCTION PANEL
ALT: 35 U/L (ref 0–35)
AST: 26 U/L (ref 0–37)
Albumin: 4.5 g/dL (ref 3.5–5.2)
Alkaline Phosphatase: 104 U/L (ref 39–117)
Bilirubin, Direct: 0.1 mg/dL (ref 0.0–0.3)
Total Bilirubin: 0.6 mg/dL (ref 0.2–1.2)
Total Protein: 7 g/dL (ref 6.0–8.3)

## 2023-01-10 DIAGNOSIS — M545 Low back pain, unspecified: Secondary | ICD-10-CM | POA: Diagnosis not present

## 2023-01-10 DIAGNOSIS — M79602 Pain in left arm: Secondary | ICD-10-CM | POA: Diagnosis not present

## 2023-01-10 DIAGNOSIS — M79601 Pain in right arm: Secondary | ICD-10-CM | POA: Diagnosis not present

## 2023-01-19 ENCOUNTER — Other Ambulatory Visit (HOSPITAL_COMMUNITY): Payer: Self-pay | Admitting: Physician Assistant

## 2023-01-19 ENCOUNTER — Other Ambulatory Visit: Payer: Self-pay | Admitting: Physician Assistant

## 2023-01-19 DIAGNOSIS — E876 Hypokalemia: Secondary | ICD-10-CM

## 2023-01-20 ENCOUNTER — Other Ambulatory Visit (HOSPITAL_COMMUNITY): Payer: Self-pay | Admitting: Physician Assistant

## 2023-02-01 ENCOUNTER — Other Ambulatory Visit (HOSPITAL_COMMUNITY): Payer: Self-pay | Admitting: *Deleted

## 2023-02-01 MED ORDER — DILTIAZEM HCL ER COATED BEADS 120 MG PO CP24: 120.0000 mg | ORAL_CAPSULE | Freq: Every day | ORAL | 0 refills | Status: DC

## 2023-02-01 MED ORDER — DILTIAZEM HCL 30 MG PO TABS: ORAL_TABLET | ORAL | 1 refills | Status: DC

## 2023-02-02 DIAGNOSIS — H5213 Myopia, bilateral: Secondary | ICD-10-CM | POA: Diagnosis not present

## 2023-02-07 DIAGNOSIS — M79602 Pain in left arm: Secondary | ICD-10-CM | POA: Diagnosis not present

## 2023-02-07 DIAGNOSIS — M545 Low back pain, unspecified: Secondary | ICD-10-CM | POA: Diagnosis not present

## 2023-02-07 DIAGNOSIS — M79601 Pain in right arm: Secondary | ICD-10-CM | POA: Diagnosis not present

## 2023-02-17 ENCOUNTER — Encounter: Payer: Self-pay | Admitting: Physician Assistant

## 2023-02-23 DIAGNOSIS — M79602 Pain in left arm: Secondary | ICD-10-CM | POA: Diagnosis not present

## 2023-02-23 DIAGNOSIS — M79601 Pain in right arm: Secondary | ICD-10-CM | POA: Diagnosis not present

## 2023-02-23 DIAGNOSIS — M545 Low back pain, unspecified: Secondary | ICD-10-CM | POA: Diagnosis not present

## 2023-02-25 ENCOUNTER — Other Ambulatory Visit (HOSPITAL_COMMUNITY): Payer: Self-pay | Admitting: Physician Assistant

## 2023-02-27 ENCOUNTER — Other Ambulatory Visit: Payer: Self-pay | Admitting: Physician Assistant

## 2023-02-27 DIAGNOSIS — M810 Age-related osteoporosis without current pathological fracture: Secondary | ICD-10-CM

## 2023-02-27 DIAGNOSIS — I480 Paroxysmal atrial fibrillation: Secondary | ICD-10-CM

## 2023-02-28 ENCOUNTER — Other Ambulatory Visit: Payer: Self-pay | Admitting: Physician Assistant

## 2023-02-28 DIAGNOSIS — E876 Hypokalemia: Secondary | ICD-10-CM

## 2023-02-28 DIAGNOSIS — I480 Paroxysmal atrial fibrillation: Secondary | ICD-10-CM | POA: Diagnosis not present

## 2023-02-28 LAB — DHEA-SULFATE: DHEA-SO4: 33.1 ug/dL (ref 20.4–186.6)

## 2023-02-28 LAB — ESTRADIOL: Estradiol: 5.6 pg/mL (ref 0.0–54.7)

## 2023-02-28 LAB — VITAMIN D 25 HYDROXY (VIT D DEFICIENCY, FRACTURES): Vit D, 25-Hydroxy: 64.3 ng/mL (ref 30.0–100.0)

## 2023-02-28 LAB — TESTOSTERONE: Testosterone: 6 ng/dL (ref 3–67)

## 2023-02-28 NOTE — Telephone Encounter (Signed)
Melinda [name], is pt suppose to be taking Potassium 10 MEQ twice a day?

## 2023-03-01 LAB — HOMOCYSTEINE: Homocysteine: 7.3 umol/L (ref 0.0–17.2)

## 2023-03-07 DIAGNOSIS — M79601 Pain in right arm: Secondary | ICD-10-CM | POA: Diagnosis not present

## 2023-03-07 DIAGNOSIS — M79602 Pain in left arm: Secondary | ICD-10-CM | POA: Diagnosis not present

## 2023-03-07 DIAGNOSIS — M545 Low back pain, unspecified: Secondary | ICD-10-CM | POA: Diagnosis not present

## 2023-03-09 ENCOUNTER — Ambulatory Visit (HOSPITAL_COMMUNITY)
Admission: RE | Admit: 2023-03-09 | Discharge: 2023-03-09 | Disposition: A | Discharge status: Home / Self Care | Payer: Medicare HMO | Source: Ambulatory Visit | Attending: Physician Assistant | Admitting: Physician Assistant

## 2023-03-09 ENCOUNTER — Telehealth: Payer: Self-pay

## 2023-03-09 ENCOUNTER — Encounter (HOSPITAL_COMMUNITY): Payer: Self-pay | Admitting: Physician Assistant

## 2023-03-09 VITALS — BP 134/86 | HR 64 | Ht 67.0 in | Wt 157.4 lb

## 2023-03-09 DIAGNOSIS — I480 Paroxysmal atrial fibrillation: Secondary | ICD-10-CM | POA: Insufficient documentation

## 2023-03-09 DIAGNOSIS — E785 Hyperlipidemia, unspecified: Secondary | ICD-10-CM | POA: Diagnosis not present

## 2023-03-09 DIAGNOSIS — D6869 Other thrombophilia: Secondary | ICD-10-CM | POA: Diagnosis not present

## 2023-03-09 DIAGNOSIS — I4892 Unspecified atrial flutter: Secondary | ICD-10-CM | POA: Diagnosis not present

## 2023-03-09 DIAGNOSIS — I10 Essential (primary) hypertension: Secondary | ICD-10-CM | POA: Diagnosis not present

## 2023-03-09 DIAGNOSIS — Z7901 Long term (current) use of anticoagulants: Secondary | ICD-10-CM | POA: Insufficient documentation

## 2023-03-09 DIAGNOSIS — I2510 Atherosclerotic heart disease of native coronary artery without angina pectoris: Secondary | ICD-10-CM | POA: Insufficient documentation

## 2023-03-09 DIAGNOSIS — G4733 Obstructive sleep apnea (adult) (pediatric): Secondary | ICD-10-CM | POA: Diagnosis not present

## 2023-03-09 NOTE — Telephone Encounter (Signed)
Returned my call, explained PREP, she would like to attend at [facility], but has a lot of conflicts with her schedule; could possibly attend 8/27 class, but knows there is a waitlist; will contact if I have cancellations for that class; if not will let her know Sept/Oct schedule at [facility] once those dates are set.

## 2023-03-09 NOTE — Telephone Encounter (Signed)
Called re: PREP program referral, left voicemail requesting return call.  

## 2023-03-09 NOTE — Patient Instructions (Signed)
Multaq verses Tikosyn- (Dofetilide)

## 2023-03-09 NOTE — Progress Notes (Signed)
Primary Care Physician: Jarold Motto, [place] Primary Cardiologist: Dr Mayford Knife Primary Electrophysiologist: none Referring Physician: [facility]   Melinda [name] is a 72 y.o. female with a history of HLD, OSA, CAD, HTN, atrial fibrillation who presents for follow up in the [facility].  The patient was initially diagnosed with atrial fibrillation and atrial flutter on 02/04/21 when she presented to her PCP with palpitations. ECG showed rapid atrial flutter and she was sent to the ED. She was started on rate control and then her ECG showed afib. Patient was started on Xarelto for a CHADS2VASC score of 4.   On follow up today, patient reports that she has had more frequent episodes of afib over the past 2-3 months. Apple Watch strips personally reviewed today. She has made changes to her diet and activity and has been avoiding alcohol. The episodes do still respond to PRN diltiazem. No bleeding issues on anticoagulation.   Today, she denies symptoms of chest pain, shortness of breath, orthopnea, PND, lower extremity edema, dizziness, presyncope, syncope, bleeding, or neurologic sequela. The patient is tolerating medications without difficulties and is otherwise without complaint today.    Atrial Fibrillation Risk Factors:  she does have symptoms or diagnosis of sleep apnea. She is not using a CPAP she does not have a history of rheumatic fever. she does not have a history of alcohol use. The patient does not have a history of early familial atrial fibrillation or other arrhythmias.   Atrial Fibrillation Management history:  Previous antiarrhythmic drugs: none Previous cardioversions: none Previous ablations: none Anticoagulation history: Xarelto   Past Medical History:  Diagnosis Date   Arthritis    Back problem    Cataracts, bilateral    Chicken pox    Disorder of adrenal gland (HCC)    Disorders of sulfur-bearing amino-acid metabolism,  unspecified (HCC)    Displaced fracture of base of unspecified metacarpal bone, initial encounter for closed fracture    Gallstones    Hyperlipidemia    Hypertension    Insomnia    Iron deficiency anemia, unspecified    mostly vegetarian   Measles    Mumps    Sciatica    Sensitivity to medication    Sprain of unspecified ligament of right ankle, subsequent encounter    Vitamin D deficiency, unspecified    ROS- All systems are reviewed and negative except as per the HPI above.  Physical Exam: Vitals:   03/09/23 1051  BP: 134/86  Pulse: 64  Weight: 71.4 kg  Height: 5\' 7"  (1.702 m)     GEN: Well nourished, well developed in no acute distress NECK: No JVD; No carotid bruits CARDIAC: Regular rate and rhythm, no murmurs, rubs, gallops RESPIRATORY:  Clear to auscultation without rales, wheezing or rhonchi  ABDOMEN: Soft, non-tender, non-distended EXTREMITIES:  No edema; No deformity    Wt Readings from Last 3 Encounters:  03/09/23 71.4 kg  12/29/22 72.6 kg  12/13/22 73 kg    EKG today demonstrates  SR Vent. rate 64 BPM PR interval 136 ms QRS duration 78 ms QT/QTcB 426/439 ms  Echo 03/02/21  1. Left ventricular ejection fraction by 3D volume is 62 %. The left  ventricle has normal function. The left ventricle has no regional wall  motion abnormalities. Left ventricular diastolic parameters were normal.   2. Right ventricular systolic function is normal. The right ventricular  size is normal. There is normal pulmonary artery systolic pressure.   3. The  mitral valve is normal in structure. Mild mitral valve  regurgitation. No evidence of mitral stenosis.   4. The aortic valve is grossly normal. Aortic valve regurgitation is  trivial. No aortic stenosis is present.   Epic records are reviewed at length today  CHA2DS2-VASc Score = 4  The patient's score is based upon: CHF History: 0 HTN History: 1 Diabetes History: 0 Stroke History: 0 Vascular Disease History: 1  (calcification on CT) Age Score: 1 Gender Score: 1        ASSESSMENT AND PLAN: Paroxsymal Atrial Fibrillation/atrial flutter The patient's CHA2DS2-VASc score is 4, indicating a 4.8% annual risk of stroke.   Patient having more frequent episodes of symptomatic afib. We discussed rhythm control options today including Multaq, dofetilide, and ablation. Would like to avoid class IC with coronary calcifications although they are mild and nonobstructive. She would like to avoid more medication and is agreeable to consultation with EP to discuss ablation.  Continue diltiazem 30 mg PRN q 4 hours for heart racing/palpitations.  Continue Xarelto 20 mg daily Continue diltiazem 120 mg daily Apple watch for home monitoring. Will refer her to [facility]   Secondary Hypercoagulable State (ICD10:  [phone]) The patient is at significant risk for stroke/thromboembolism based upon her CHA2DS2-VASc Score of 4.  Continue Rivaroxaban (Xarelto).   HTN Stable on current regimen  OSA  Mild OSA, not currently on CPAP She has an upcoming appointment with Dr Mayford Knife 8/9  CAD CAC score 90.9, all in LAD Patient declined statin No anginal symptoms   Follow up with Dr Mayford Knife as scheduled. Will refer her to EP to establish care/discuss ablation.    Jorja Loa PA-C [facility] [address] [place], [place] [address] [phone] 03/09/2023 1:00 PM

## 2023-03-14 DIAGNOSIS — M5459 Other low back pain: Secondary | ICD-10-CM | POA: Diagnosis not present

## 2023-03-14 DIAGNOSIS — M549 Dorsalgia, unspecified: Secondary | ICD-10-CM | POA: Diagnosis not present

## 2023-03-14 DIAGNOSIS — G5700 Lesion of sciatic nerve, unspecified lower limb: Secondary | ICD-10-CM | POA: Diagnosis not present

## 2023-03-14 DIAGNOSIS — M9902 Segmental and somatic dysfunction of thoracic region: Secondary | ICD-10-CM | POA: Diagnosis not present

## 2023-03-14 DIAGNOSIS — M810 Age-related osteoporosis without current pathological fracture: Secondary | ICD-10-CM | POA: Diagnosis not present

## 2023-03-14 DIAGNOSIS — M9901 Segmental and somatic dysfunction of cervical region: Secondary | ICD-10-CM | POA: Diagnosis not present

## 2023-03-14 DIAGNOSIS — I4819 Other persistent atrial fibrillation: Secondary | ICD-10-CM | POA: Diagnosis not present

## 2023-03-16 ENCOUNTER — Encounter: Payer: Self-pay | Admitting: Cardiology

## 2023-03-16 DIAGNOSIS — R931 Abnormal findings on diagnostic imaging of heart and coronary circulation: Secondary | ICD-10-CM | POA: Insufficient documentation

## 2023-03-16 NOTE — Progress Notes (Signed)
Sleep medicine consult note:    Date:  03/17/2023   ID:  Melinda [name], DOB 1951-05-28, MRN [id number]  PCP:  Jarold Motto, PA   [facility] Providers Cardiologist:  None    Referring MD: Jarold Motto, PA    History of Present Illness:    Melinda [name] is a 72 y.o. female with a hx of HTN, HLD, paroxsymal Afib, and arthritis who was referred by Alphonzo Severance, PA for further evaluation of sleep apnea.  The patient is was followed by Laurance Flatten, MD for paroxysmal atrial fibrillation, hypertension and coronary calcium score.   Patient was initially diagnosed with Afib in 01/2021 when she presented to her PCP with palpitations. ECG showed rapid atrial flutter and she was sent to the ED. She was started on rate control and then her ECG showed afib. Patient was started on Xarelto for a CHADS2VASC score of 3. She since converted back to SR. During that visit, the patient had two episodes of Afib that resolved with prn Dilt. TTE 02/2021 with LVEF 62%, normal RV, mild MR.   She went a sleep study in December 2023 due to concern for obstructive sleep apnea which could be driving A-fib.  Patient has had her sleep study and has mild OSA. Was fitted for a nasal appliance and her O2 sats have been good on her home monitor.  Home sleep study showed mild obstructive sleep apnea with an AHI of 6.3/h and she was started on auto CPAP from 5 to 15 cm H2O.  She is now referred for sleep medicine consultation to establish sleep care and treatment of her obstructive sleep apnea.  She is here today for followup and is doing well.  She denies any chest pain or pressure, SOB, DOE, PND, orthopnea, LE edema, dizziness, palpitations or syncope. She is compliant with her meds and is tolerating meds with no SE.    She has not started CPAP therapy despite having PAF.  She was seen by Dr. Myrtis Ser who recommended a trial of oral device.  She has tried some silicone inserts.  She has always felt tired when  she gets up.  She does have times where she feels tired and does not have to nap unless she is in afib.  She is convinced that her afib is related to hormone problems.  Her accupuncturist told her that it is unlikely related to OSA.    Past Medical History:  Diagnosis Date   Agatston coronary artery calcium score less than 100    Coronary calcium score 90 in April 2024   Arthritis    Back problem    Cataracts, bilateral    Chicken pox    Disorder of adrenal gland (HCC)    Disorders of sulfur-bearing amino-acid metabolism, unspecified (HCC)    Displaced fracture of base of unspecified metacarpal bone, initial encounter for closed fracture    Gallstones    Hyperlipidemia    Hypertension    Insomnia    Iron deficiency anemia, unspecified    mostly vegetarian   Measles    Mumps    Sciatica    Sensitivity to medication    Sprain of unspecified ligament of right ankle, subsequent encounter    Vitamin D deficiency, unspecified     History reviewed. No pertinent surgical history.  Current Medications: Current Meds  Medication Sig   Acetylcysteine (NAC) 600 MG CAPS Take 1 capsule by mouth daily in the afternoon.   Ascorbic Acid (VITAMIN C PO)  Take 1 tablet by mouth daily in the afternoon. Vitamin C and Bio-Quercetin Phytosome   Cholecalciferol (VITAMIN D) 125 MCG (5000 UT) CAPS Take 1 capsule by mouth. Five times a week   Digestive Enzymes (DIGESTIVE ENZYME PO) Take 1 tablet by mouth daily in the afternoon. Primary Digest- Brand   diltiazem (CARDIZEM CD) 120 MG 24 hr capsule Take 1 capsule (120 mg total) by mouth daily.   diltiazem (CARDIZEM) 30 MG tablet Take 1 tablet every 4 hours AS NEEDED for heart rate >100   GLYCINE PO Take 500 mg by mouth. Take 4 tabs at bedtime on an empty stomach or open and melt . May repeat in the middle of the night if needed   Hydroxyapatite-Cholecal-Mg (OSSOPAN MD PO) Take 2.5 mcg by mouth.   magnesium gluconate (MAGONATE) 500 MG tablet Take 500 mg by  mouth daily in the afternoon.   MAGNESIUM GLYCINATE PO Take 1 tablet by mouth daily.   melatonin 3 MG TABS tablet Take 6 mg by mouth at bedtime. Take 2 tab at bedtime   Multiple Vitamins-Minerals (MULTIVITAMIN WOMEN PO) Take 1 tablet by mouth daily in the afternoon. Vitamin Code 50 and wiser   NON FORMULARY P 5 P 50 mg  -Taking 1 tablet by mouth once a day   OVER THE COUNTER MEDICATION Take 4 tablets by mouth daily.   OVER THE COUNTER MEDICATION Take 1 capsule by mouth daily in the afternoon. Unique E-   OVER THE COUNTER MEDICATION Take 1 tablet by mouth daily in the afternoon. MacuGuard Ocular Support   OVER THE COUNTER MEDICATION Take 1 tablet by mouth daily in the afternoon. LV-GB Complex   potassium chloride (KLOR-CON) 10 MEQ tablet Take 1 tablet by mouth twice daily   Pyridoxine HCl (VITAMIN B-6 PO) Take 1 capsule by mouth daily in the afternoon. P5P   QUERCETIN PO Take 1 tablet by mouth daily in the afternoon.   rivaroxaban (XARELTO) 20 MG TABS tablet Take 1 tablet (20 mg total) by mouth daily with supper.   UNABLE TO FIND CBD oil 1000mg    UNABLE TO FIND Med Name: Betaplex   VITAMIN K PO Take 1 tablet by mouth in the morning and at bedtime. Potassium     Allergies:   Pseudoephedrine   Social History   Socioeconomic History   Marital status: Married    Spouse name: Not on file   Number of children: Not on file   Years of education: Not on file   Highest education level: Not on file  Occupational History   Occupation: FREELANCE VIOLINIST   Occupation: TEACHER  Tobacco Use   Smoking status: Never   Smokeless tobacco: Never   Tobacco comments:    Never smoke 12/28/21  Substance and Sexual Activity   Alcohol use: Not Currently    Alcohol/week: 1.0 standard drink of alcohol    Types: 1 Standard drinks or equivalent per week    Comment: very small amount occasionally 09/29/21   Drug use: No   Sexual activity: Never  Other Topics Concern   Not on file  Social History  Narrative   Freelance violinist   Lives with husband   2 children -- remain local   No grandchildren      Prefers Integrative Medicine, but okay with a DO. Previously saw Dr. Alessandra Bevels. Pescatarian for the most part. Intermittent fasting with a 6 hour eating window. Hx of feeling sick after flu shot in the past, so declines flu shot. Hep C  in 2013. Hx of breast biopsy. Hx of hypokalemia, previously on KLC 10 mEQ daily. Patient wants to lose 20 pounds in order to get BP at goal rather than try medication. FamHx of HTN. Previous exercise included HIIT at the gym. During COVID, has started baking more and not exercising as much. 03/03/2019    Social Determinants of Health   Financial Resource Strain: Low Risk  (06/17/2022)   Overall Financial Resource Strain (CARDIA)    Difficulty of Paying Living Expenses: Not hard at all  Food Insecurity: No Food Insecurity (06/17/2022)   Hunger Vital Sign    Worried About Running Out of Food in the Last Year: Never true    Ran Out of Food in the Last Year: Never true  Transportation Needs: No Transportation Needs (06/17/2022)   PRAPARE - Administrator, Civil Service (Medical): No    Lack of Transportation (Non-Medical): No  Physical Activity: Sufficiently Active (06/17/2022)   Exercise Vital Sign    Days of Exercise per Week: 5 days    Minutes of Exercise per Session: 30 min  Stress: No Stress Concern Present (06/17/2022)   [employer] of Occupational Health - Occupational Stress Questionnaire    Feeling of Stress : Not at all  Social Connections: Moderately Integrated (06/17/2022)   Social Connection and Isolation Panel [NHANES]    Frequency of Communication with Friends and Family: Once a week    Frequency of Social Gatherings with Friends and Family: More than three times a week    Attends Religious Services: More than 4 times per year    Active Member of [employer] or Organizations: No    Attends Banker Meetings: Never     Marital Status: Married     Family History: The patient'sfamily history includes Angina in her paternal grandfather; Autoimmune disease in her maternal uncle; Bone cancer in her father; Diabetes in her paternal grandfather; Glaucoma in her maternal aunt and maternal grandmother; Heart disease in her brother; Heart disease (age of onset: 90) in her maternal grandfather; Heart failure in her mother; Hypertension in her brother, maternal aunt, maternal grandfather, and mother; Hypothyroidism in her mother; Kidney Stones in her maternal uncle; Melanoma (age of onset: 42) in her paternal uncle; Mental illness in her paternal aunt and paternal grandmother; Parkinson's disease in her brother and maternal aunt; Prostate cancer in her maternal uncle; Stroke in her maternal uncle and mother.  ROS:   Please see the history of present illness.     All other systems reviewed and are negative.  EKGs/Labs/Other Studies Reviewed:    The following studies were reviewed today: Echo 03/02/21  1. Left ventricular ejection fraction by 3D volume is 62 %. The left  ventricle has normal function. The left ventricle has no regional wall  motion abnormalities. Left ventricular diastolic parameters were normal.   2. Right ventricular systolic function is normal. The right ventricular  size is normal. There is normal pulmonary artery systolic pressure.   3. The mitral valve is normal in structure. Mild mitral valve  regurgitation. No evidence of mitral stenosis.   4. The aortic valve is grossly normal. Aortic valve regurgitation is  trivial. No aortic stenosis is present.   EKG:  EKG is  ordered today.  The ekg ordered today demonstrates NSR with HR 66  Recent Labs: 12/05/2022: Hemoglobin 13.0; Platelets 160.0 12/29/2022: BUN 12; Creatinine, Ser 0.82; Potassium 4.0; Sodium 139 01/05/2023: ALT 35  Recent Lipid Panel    Component Value  Date/Time   CHOL 213 (H) 12/05/2022 1339   TRIG 132.0 12/05/2022 1339   HDL  62.40 12/05/2022 1339   CHOLHDL 3 12/05/2022 1339   VLDL 26.4 12/05/2022 1339   LDLCALC 125 (H) 12/05/2022 1339     Risk Assessment/Calculations:    CHA2DS2-VASc Score = 4   This indicates a 4.8% annual risk of stroke. The patient's score is based upon: CHF History: 0 HTN History: 1 Diabetes History: 0 Stroke History: 0 Vascular Disease History: 1 (calcification on CT) Age Score: 1 Gender Score: 1    Physical Exam:    VS:  BP 132/84   Pulse 70   Ht 5\' 7"  (1.702 m)   Wt 158 lb 9.6 oz (71.9 kg)   LMP 08/04/2009   SpO2 98%   BMI 24.84 kg/m     Wt Readings from Last 3 Encounters:  03/17/23 158 lb 9.6 oz (71.9 kg)  03/09/23 157 lb 6.4 oz (71.4 kg)  12/29/22 160 lb (72.6 kg)     GEN: Well nourished, well developed in no acute distress HEENT: Normal NECK: No JVD; No carotid bruits LYMPHATICS: No lymphadenopathy CARDIAC:RRR, no murmurs, rubs, gallops RESPIRATORY:  Clear to auscultation without rales, wheezing or rhonchi  ABDOMEN: Soft, non-tender, non-distended MUSCULOSKELETAL:  No edema; No deformity  SKIN: Warm and dry NEUROLOGIC:  Alert and oriented x 3 PSYCHIATRIC:  Normal affect  ASSESSMENT:    1. Obstructive sleep apnea   2. Paroxysmal atrial fibrillation (HCC)   3. Primary hypertension   4. Agatston coronary artery calcium score less than 100     PLAN:    In order of problems listed above:  #OSA/Snoring: -She had a sleep study done in December 2023 showing mild obstructive sleep apnea with an AHI of 6.3/h and auto CPAP was recommended but she did not feel her OSA was triggering her afib -her afib has become more frequent -she saw Dr. Myrtis Ser with sleep dentistry and was fitted for an oral device but never called back to say she wanted to proceed -given her increased afib, recommend that she either try CPAP or proceed with the oral device -she has decided to try the oral device  #Paroxysmal Afib: -CHADs-vasc 3.  -She remains in normal sinus rhythm on  exam and denies any palpitations -Continue prescription management with Cardizem CD 120 mg daily and Xarelto 20 mg daily with as needed refills -She also has short acting Cardizem 30 mg to take as needed for breakthrough palpitations  #HTN: -She does have a history of whitecoat hypertension but blood pressure is stable -Continue prescription drug anginal with Cardizem CD 120 mg daily with as needed refills  # Coronary calcium/family History of CAD: -Coronary cath score was 90 on 11/07/2022 which is 70 percentile for age, sex and race matched controls -Prefers to not start cholesterol therapy if possible -Her last LDL was 125 on 12/05/2022 -I am going to repeat FLP and ALT as well as LP(a) -No aspirin due to DOAC   Medication Adjustments/Labs and Tests Ordered: Current medicines are reviewed at length with the patient today.  Concerns regarding medicines are outlined above.  No orders of the defined types were placed in this encounter.  No orders of the defined types were placed in this encounter.   There are no Patient Instructions on file for this visit.   Signed, Armanda Magic, MD  03/17/2023 9:15 AM    [facility]

## 2023-03-17 ENCOUNTER — Ambulatory Visit: Payer: Medicare HMO | Admitting: Cardiology

## 2023-03-17 ENCOUNTER — Encounter: Payer: Self-pay | Admitting: Cardiology

## 2023-03-17 VITALS — BP 132/84 | HR 70 | Ht 67.0 in | Wt 158.6 lb

## 2023-03-17 DIAGNOSIS — I480 Paroxysmal atrial fibrillation: Secondary | ICD-10-CM | POA: Diagnosis not present

## 2023-03-17 DIAGNOSIS — G4733 Obstructive sleep apnea (adult) (pediatric): Secondary | ICD-10-CM | POA: Diagnosis not present

## 2023-03-17 DIAGNOSIS — I10 Essential (primary) hypertension: Secondary | ICD-10-CM | POA: Diagnosis not present

## 2023-03-17 DIAGNOSIS — R931 Abnormal findings on diagnostic imaging of heart and coronary circulation: Secondary | ICD-10-CM | POA: Diagnosis not present

## 2023-03-17 DIAGNOSIS — Z79899 Other long term (current) drug therapy: Secondary | ICD-10-CM | POA: Diagnosis not present

## 2023-03-17 DIAGNOSIS — E785 Hyperlipidemia, unspecified: Secondary | ICD-10-CM | POA: Diagnosis not present

## 2023-03-17 NOTE — Addendum Note (Signed)
Addended by: Luellen Pucker on: 03/17/2023 09:33 AM   Modules accepted: Orders

## 2023-03-17 NOTE — Patient Instructions (Signed)
Medication Instructions:  Your physician recommends that you continue on your current medications as directed. Please refer to the Current Medication list given to you today.  *If you need a refill on your cardiac medications before your next appointment, please call your pharmacy*   Lab Work: Please complete a FASTING lipid panel, ALT and LP(a) in our lab before you leave today.  If you have labs (blood work) drawn today and your tests are completely normal, you will receive your results only by: MyChart Message (if you have MyChart) OR A paper copy in the mail If you have any lab test that is abnormal or we need to change your treatment, we will call you to review the results.   Testing/Procedures: None.   Follow-Up: At [facility], you and your health needs are our priority.  As part of our continuing mission to provide you with exceptional heart care, we have created designated Provider Care Teams.  These Care Teams include your primary Cardiologist (physician) and Advanced Practice Providers (APPs -  Physician Assistants and Nurse Practitioners) who all work together to provide you with the care you need, when you need it.  We recommend signing up for the patient portal called "MyChart".  Sign up information is provided on this After Visit Summary.  MyChart is used to connect with patients for Virtual Visits (Telemedicine).  Patients are able to view lab/test results, encounter notes, upcoming appointments, etc.  Non-urgent messages can be sent to your provider as well.   To learn more about what you can do with MyChart, go to [email or web address].    Your next appointment:   1 year(s)  Provider:   Dr. Armanda Magic, MD  Other Instructions Please let us know if you receive an oral appliance from Dr. Myrtis Ser.

## 2023-03-20 ENCOUNTER — Ambulatory Visit: Payer: Medicare HMO | Attending: Cardiology

## 2023-03-20 DIAGNOSIS — E785 Hyperlipidemia, unspecified: Secondary | ICD-10-CM | POA: Diagnosis not present

## 2023-03-20 DIAGNOSIS — R931 Abnormal findings on diagnostic imaging of heart and coronary circulation: Secondary | ICD-10-CM | POA: Diagnosis not present

## 2023-03-20 DIAGNOSIS — Z79899 Other long term (current) drug therapy: Secondary | ICD-10-CM | POA: Diagnosis not present

## 2023-03-22 ENCOUNTER — Encounter: Payer: Self-pay | Admitting: Physician Assistant

## 2023-03-23 ENCOUNTER — Other Ambulatory Visit: Payer: Self-pay | Admitting: Family Medicine

## 2023-03-23 ENCOUNTER — Telehealth (INDEPENDENT_AMBULATORY_CARE_PROVIDER_SITE_OTHER): Payer: Medicare HMO | Admitting: Family Medicine

## 2023-03-23 ENCOUNTER — Other Ambulatory Visit (HOSPITAL_COMMUNITY): Payer: Self-pay

## 2023-03-23 ENCOUNTER — Encounter: Payer: Self-pay | Admitting: Family Medicine

## 2023-03-23 VITALS — Temp 97.8°F | Ht 67.0 in | Wt 157.0 lb

## 2023-03-23 DIAGNOSIS — U071 COVID-19: Secondary | ICD-10-CM

## 2023-03-23 MED ORDER — MOLNUPIRAVIR EUA 200MG CAPSULE
4.0000 | ORAL_CAPSULE | Freq: Two times a day (BID) | ORAL | 0 refills | Status: AC
  Filled 2023-03-23: qty 40, 5d supply, fill #0

## 2023-03-23 MED ORDER — MOLNUPIRAVIR EUA 200MG CAPSULE: 4.0000 | ORAL_CAPSULE | Freq: Two times a day (BID) | ORAL | 0 refills | Status: DC

## 2023-03-23 NOTE — Progress Notes (Signed)
Patient was unable to self-report vitals due to a lack of equipment at home via telehealth

## 2023-03-23 NOTE — Telephone Encounter (Signed)
Pt requesting another medication, this molnupiravir EUA (LAGEVRIO) 200 mg CAPS capsule  one is too costly is there another substitution.

## 2023-03-23 NOTE — Telephone Encounter (Signed)
[facility] and rx not covered either. Discussed paxlovid with pt. Would have to adjust medications to do paxlovid. She decided to hold of on antiviral for now. Discussed symptomatic care and nasal saline. Again advised inperson care if worsening or any severe symptoms or not improving as expected.

## 2023-03-23 NOTE — Patient Instructions (Addendum)
HOME CARE TIPS:   -I sent the medication(s) we discussed to your pharmacy: Meds ordered this encounter  Medications   molnupiravir EUA (LAGEVRIO) 200 mg CAPS capsule    Sig: Take 4 capsules (800 mg total) by mouth 2 (two) times daily for 5 days.    Dispense:  40 capsule    Refill:  0     -I sent in the Covid19 treatment or referral you requested per our discussion. Please see the information provided below and discuss further with the pharmacist/treatment team.    -there is a chance of rebound illness with covid after improving. This can happen whether or not you take an antiviral treatment. If you become sick again with covid after getting better, please schedule a follow up virtual visit and isolate again.  -nasal saline sinus rinses twice daily  -stay hydrated, drink plenty of fluids and eat small healthy meals - avoid dairy  -follow up with your doctor in 2-3 days unless improving and feeling better  -stay home while sick, except to seek medical care. If you have COVID19, you will likely be contagious for 7-10 days. Flu or Influenza is likely contagious for about 7 days. Other respiratory viral infections remain contagious for 5-10+ days depending on the virus and many other factors. Wear a good mask that fits snugly (such as N95 or KN95) if around others to reduce the risk of transmission. If you wish to use tests to help gauge how long you are contagious - start testing on day 6 every other day. If you have 2 tests, 48 hours apart, both negative after day 5 you likely are no longer contagious.   It was nice to meet you today, and I really hope you are feeling better soon. I help [facility] out with telemedicine visits on Tuesdays and Thursdays and am happy to help if you need a follow up virtual visit on those days. Otherwise, if you have any concerns or questions following this visit please schedule a follow up visit with your Primary Care doctor or seek care at a local urgent care  clinic to avoid delays in care.    Seek in person care or schedule a follow up video visit promptly if your symptoms worsen, new concerns arise or you are not improving with treatment. Call 911 and/or seek emergency care if your symptoms are severe or life threatening.   Molnupiravir Capsules What is this medication? MOLNUPIRAVIR (MOL nue PIR a vir) treats mild to moderate COVID-19. It may help people who are at high risk of developing severe illness. This medication works by limiting the spread of the virus in your body. The FDA has allowed the emergency use of this medication. This medicine may be used for other purposes; ask your health care provider or pharmacist if you have questions. COMMON BRAND NAME(S): LAGEVRIO What should I tell my care team before I take this medication? They need to know if you have any of these conditions: Any allergies Any serious illness An unusual or allergic reaction to molnupiravir, other medications, foods, dyes, or preservatives Pregnant or trying to get pregnant Breast-feeding How should I use this medication? Take this medication by mouth with water. Take it as directed on the prescription label at the same time every day. Do not cut, crush, or chew this medication. Swallow the capsules whole. You can take it with or without food. If it upsets your stomach, take it with food. Take all of it unless your care team tells  you to stop it early. Keep taking it even if you think you are better. Talk to your care team about the use of this medication in children. Special care may be needed. Overdosage: If you think you have taken too much of this medicine contact a poison control center or emergency room at once. NOTE: This medicine is only for you. Do not share this medicine with others. What if I miss a dose? If you miss a dose, take it as soon as you can unless it is more than 10 hours late. If it is more than 10 hours late, skip the missed dose. Take the  next dose at the normal time. Do not take extra or 2 doses at the same time to make up for the missed dose. What may interact with this medication? Interactions have not been studied. This list may not describe all possible interactions. Give your health care provider a list of all the medicines, herbs, non-prescription drugs, or dietary supplements you use. Also tell them if you smoke, drink alcohol, or use illegal drugs. Some items may interact with your medicine. What should I watch for while using this medication? Your condition will be monitored carefully while you are receiving this medication. Visit your care team for regular checkups. Tell your care team if your symptoms do not start to get better or if they get worse. Do not become pregnant while taking this medication. You may need a pregnancy test before starting this medication. Women must use a reliable form of birth control while taking this medication and for 4 days after stopping the medication. Women should inform their care team if they wish to become pregnant or think they might be pregnant. Men should not father a child while taking this medication and for 3 months after stopping it. There is potential for serious harm to an unborn child. Talk to your care team for more information. Do not breast-feed an infant while taking this medication and for 4 days after stopping the medication. What side effects may I notice from receiving this medication? Side effects that you should report to your care team as soon as possible: Allergic reactions--skin rash, itching, hives, swelling of the face, lips, tongue, or throat Side effects that usually do not require medical attention (report these to your care team if they continue or are bothersome): Diarrhea Dizziness Nausea This list may not describe all possible side effects. Call your doctor for medical advice about side effects. You may report side effects to FDA at 1-800-FDA-1088. Where  should I keep my medication? Keep out of the reach of children and pets. Store at room temperature between 20 and 25 degrees C (68 and 77 degrees F). Get rid of any unused medication after the expiration date. To get rid of medications that are no longer needed or have expired: Take the medication to a medication take-back program. Check with your pharmacy or law enforcement to find a location. If you cannot return the medication, check the label or package insert to see if the medication should be thrown out in the garbage or flushed down the toilet. If you are not sure, ask your care team. If it is safe to put it in the trash, take the medication out of the container. Mix the medication with cat litter, dirt, coffee grounds, or other unwanted substance. Seal the mixture in a bag or container. Put it in the trash. NOTE: This sheet is a summary. It may not cover all possible  information. If you have questions about this medicine, talk to your doctor, pharmacist, or health care provider.  2024 Elsevier/Gold Standard (2021-09-20 00:00:00)

## 2023-03-23 NOTE — Progress Notes (Signed)
Virtual Visit via Video Note  I connected with Melinda [name]  on 03/23/23 at  1:00 PM EDT by a video enabled telemedicine application and verified that I am speaking with the correct person using two identifiers.  Location patient: [place] Location provider:work or home office Persons participating in the virtual visit: patient, provider  I discussed the limitations and requested verbal permission for telemedicine visit. The patient expressed understanding and agreed to proceed.   HPI:  Acute telemedicine visit for Covid: -Onset: 3 days ago, positive covid test 2 days ago -Symptoms include:headache initially, some muscle soreness, nasal congestion, fever, cough -Denies: CP, SOB, NVD -Has tried: musinex, saline spray -Pertinent past medical history: see below, egfr 74, had covid in the past -Pertinent medication allergies: last booster fall of 2023 Allergies  Allergen Reactions   Pseudoephedrine Other (See Comments)    Hyper active    -COVID-19 vaccine status:  Immunization History  Administered Date(s) Administered   Influenza-Unspecified 05/02/2022   PFIZER(Purple Top)SARS-COV-2 Vaccination 09/22/2019, 10/15/2019, 06/12/2020, 07/08/2021   Tdap 12/17/2021     ROS: See pertinent positives and negatives per HPI.  Past Medical History:  Diagnosis Date   Agatston coronary artery calcium score less than 100    Coronary calcium score 90 in April 2024   Arthritis    Back problem    Cataracts, bilateral    Chicken pox    Disorder of adrenal gland (HCC)    Disorders of sulfur-bearing amino-acid metabolism, unspecified (HCC)    Displaced fracture of base of unspecified metacarpal bone, initial encounter for closed fracture    Gallstones    Hyperlipidemia    Hypertension    Insomnia    Iron deficiency anemia, unspecified    mostly vegetarian   Measles    Mumps    Sciatica    Sensitivity to medication    Sprain of unspecified ligament of right ankle, subsequent encounter    Vitamin D  deficiency, unspecified     No past surgical history on file.   Current Outpatient Medications:    Acetylcysteine (NAC) 600 MG CAPS, Take 1 capsule by mouth daily in the afternoon., Disp: , Rfl:    Ascorbic Acid (VITAMIN C PO), Take 1 tablet by mouth daily in the afternoon. Vitamin C and Bio-Quercetin Phytosome, Disp: , Rfl:    Cholecalciferol (VITAMIN D) 125 MCG (5000 UT) CAPS, Take 1 capsule by mouth. Five times a week, Disp: , Rfl:    Digestive Enzymes (DIGESTIVE ENZYME PO), Take 1 tablet by mouth daily in the afternoon. Primary Digest- Brand, Disp: , Rfl:    diltiazem (CARDIZEM CD) 120 MG 24 hr capsule, Take 1 capsule (120 mg total) by mouth daily., Disp: 30 capsule, Rfl: 0   diltiazem (CARDIZEM) 30 MG tablet, Take 1 tablet every 4 hours AS NEEDED for heart rate >100, Disp: 30 tablet, Rfl: 1   GLYCINE PO, Take 500 mg by mouth. Take 4 tabs at bedtime on an empty stomach or open and melt . May repeat in the middle of the night if needed, Disp: , Rfl:    Hydroxyapatite-Cholecal-Mg (OSSOPAN MD PO), Take 2.5 mcg by mouth., Disp: , Rfl:    magnesium gluconate (MAGONATE) 500 MG tablet, Take 500 mg by mouth daily in the afternoon., Disp: , Rfl:    MAGNESIUM GLYCINATE PO, Take 1 tablet by mouth daily., Disp: , Rfl:    melatonin 3 MG TABS tablet, Take 6 mg by mouth at bedtime. Take 2 tab at bedtime, Disp: , Rfl:  molnupiravir EUA (LAGEVRIO) 200 mg CAPS capsule, Take 4 capsules (800 mg total) by mouth 2 (two) times daily for 5 days., Disp: 40 capsule, Rfl: 0   Multiple Vitamins-Minerals (MULTIVITAMIN WOMEN PO), Take 1 tablet by mouth daily in the afternoon. Vitamin Code 50 and wiser, Disp: , Rfl:    NON FORMULARY, P 5 P 50 mg  -Taking 1 tablet by mouth once a day, Disp: , Rfl:    OVER THE COUNTER MEDICATION, Take 4 tablets by mouth daily., Disp: , Rfl:    OVER THE COUNTER MEDICATION, Take 1 capsule by mouth daily in the afternoon. Unique E-, Disp: , Rfl:    OVER THE COUNTER MEDICATION, Take 1  tablet by mouth daily in the afternoon. MacuGuard Ocular Support, Disp: , Rfl:    OVER THE COUNTER MEDICATION, Take 1 tablet by mouth daily in the afternoon. LV-GB Complex, Disp: , Rfl:    potassium chloride (KLOR-CON) 10 MEQ tablet, Take 1 tablet by mouth twice daily, Disp: 60 tablet, Rfl: 0   Pyridoxine HCl (VITAMIN B-6 PO), Take 1 capsule by mouth daily in the afternoon. P5P, Disp: , Rfl:    QUERCETIN PO, Take 1 tablet by mouth daily in the afternoon., Disp: , Rfl:    rivaroxaban (XARELTO) 20 MG TABS tablet, Take 1 tablet (20 mg total) by mouth daily with supper., Disp: 30 tablet, Rfl: 0   UNABLE TO FIND, CBD oil 1000mg , Disp: , Rfl:    UNABLE TO FIND, Med Name: Betaplex, Disp: , Rfl:    VITAMIN K PO, Take 1 tablet by mouth in the morning and at bedtime. Potassium, Disp: , Rfl:   EXAM:  VITALS per patient if applicable:  GENERAL: alert, oriented, appears well and in no acute distress  HEENT: atraumatic, conjunttiva clear, no obvious abnormalities on inspection of external nose and ears  NECK: normal movements of the head and neck  LUNGS: on inspection no signs of respiratory distress, breathing rate appears normal, no obvious gross SOB, gasping or wheezing  CV: no obvious cyanosis  MS: moves all visible extremities without noticeable abnormality  PSYCH/NEURO: pleasant and cooperative, no obvious depression or anxiety, speech and thought processing grossly intact  ASSESSMENT AND PLAN:  Discussed the following assessment and plan:  COVID-19  -we discussed possible serious and likely etiologies, options for evaluation and workup, limitations of telemedicine visit vs in person visit, treatment, treatment risks and precautions. Pt is agreeable to treatment via telemedicine at this moment. She opted to take an antiviral and went with Legevrio due to concerns regarding potential interactions with Paxlovid. She had lots of questions about how long she will be contagious and using testing  to check. Advised most folks can be contagious from 5-10 days. Advised wearing a good tight fitting mask and good ventilation can reduce the risk of transmission to others. IF using testing to see if still contagious some experts have advise 2 negative test, 48 hours apart, after day 5.  Advised to seek prompt virtual visit or in person care if worsening, new symptoms arise, or if is not improving with treatment as expected per our conversation of expected course. Discussed options for follow up care. Did let this patient know that I do telemedicine on Tuesdays and Thursdays for [place] and those are the days I am logged into the system. Advised to schedule follow up visit with PCP, [place] virtual visits or [facility] if any further questions or concerns to avoid delays in care.   I discussed the  assessment and treatment plan with the patient. The patient was provided an opportunity to ask questions and all were answered. The patient agreed with the plan and demonstrated an understanding of the instructions.     Terressa Koyanagi, DO

## 2023-03-23 NOTE — Telephone Encounter (Signed)
Please see message does pt need a virtual visit?

## 2023-03-24 ENCOUNTER — Telehealth: Payer: Self-pay

## 2023-03-24 ENCOUNTER — Telehealth: Payer: Self-pay | Admitting: Cardiology

## 2023-03-24 DIAGNOSIS — E785 Hyperlipidemia, unspecified: Secondary | ICD-10-CM

## 2023-03-24 NOTE — Telephone Encounter (Signed)
Pt returning cal regarding lab results. Please advise

## 2023-03-24 NOTE — Telephone Encounter (Signed)
Called patient to discuss lab results. No answer, left detailed message per DPR advising that LDL and Lpa are elevated and Dr. Mayford Knife recommends referral to lipid clinic. Advised someone will call her to schedule, order placed.

## 2023-03-24 NOTE — Telephone Encounter (Signed)
Spoke with pt re results and at first was declining lipid referral and does not want take statins Encouraged pt to  make lipid clinic appt and discuss options at that time and then make a decision on best tx plan  Pt agrees with plan ./cy

## 2023-03-24 NOTE — Telephone Encounter (Signed)
-----   Message from Armanda Magic sent at 03/20/2023  5:17 PM EDT ----- Lipids still not at goal.  Please forward to lipid clinic for further recommendations.

## 2023-03-24 NOTE — Telephone Encounter (Signed)
Called to confirm participation in next PREP class at [facility] on August 27, every T/Th 12-1:15; assessment visit scheduled for 8/22 at 1pm.

## 2023-03-28 ENCOUNTER — Other Ambulatory Visit (HOSPITAL_COMMUNITY): Payer: Self-pay | Admitting: Physician Assistant

## 2023-03-30 ENCOUNTER — Other Ambulatory Visit (HOSPITAL_COMMUNITY): Payer: Self-pay | Admitting: Physician Assistant

## 2023-03-30 NOTE — Progress Notes (Signed)
[facility] PREP Evaluation  Patient Details  Name: Melinda [name] MRN: [id number] Date of Birth: Jul 22, 1951 Age: 72 y.o. PCP: Jarold Motto, PA  Vitals:   03/30/23 1306  BP: (!) 146/68  Pulse: 74  SpO2: 97%  Weight: 156 lb (70.8 kg)     [facility] Eval - 03/30/23 1300       [facility] "PREP" Location   [facility] "PREP" Location [facility]      Referral    Referring Provider [place]    Reason for referral Other   AIFB   Program Start Date 04/04/23      Measurement   Waist Circumference 35.5 inches    Hip Circumference 40.5 inches    Body fat 25.6 percent      Information for Trainer   Goals --   Lose 10 pounds by end of program; establish stength training program   Current Exercise --   PT   Orthopedic Concerns --   hx tendonitis, sciatica/piriformis; osteoporosis   Pertinent Medical History --   Afib, high cholesterol, OSA     Timed Up and Go (TUGS)   Timed Up and Go Low risk <9 seconds      Mobility and Daily Activities   I find it easy to walk up or down two or more flights of stairs. 4    I have no trouble taking out the trash. 4    I do housework such as vacuuming and dusting on my own without difficulty. 4    I can easily lift a gallon of milk (8lbs). 4    I can easily walk a mile. 4    I have no trouble reaching into high cupboards or reaching down to pick up something from the floor. 4    I do not have trouble doing out-door work such as Loss adjuster, chartered, raking leaves, or gardening. 4      Mobility and Daily Activities   I feel younger than my age. 3    I feel independent. 3    I feel energetic. 2    I live an active life.  2    I feel strong. 2    I feel healthy. 2    I feel active as other people my age. 3      How fit and strong are you.   Fit and Strong Total Score 45            Past Medical History:  Diagnosis Date   Agatston coronary artery calcium score less than 100    Coronary calcium score 90 in April 2024   Arthritis    Back problem     Cataracts, bilateral    Chicken pox    Disorder of adrenal gland (HCC)    Disorders of sulfur-bearing amino-acid metabolism, unspecified (HCC)    Displaced fracture of base of unspecified metacarpal bone, initial encounter for closed fracture    Gallstones    Hyperlipidemia    Hypertension    Insomnia    Iron deficiency anemia, unspecified    mostly vegetarian   Measles    Mumps    Sciatica    Sensitivity to medication    Sprain of unspecified ligament of right ankle, subsequent encounter    Vitamin D deficiency, unspecified    No past surgical history on file. Social History   Tobacco Use  Smoking Status Never  Smokeless Tobacco Never  Tobacco Comments   Never smoke 12/28/21  To begin PREP class  at [facility] on August 27, every T/Th 12-1:15  Sonia Baller 03/30/2023, 1:09 PM

## 2023-03-31 ENCOUNTER — Other Ambulatory Visit (HOSPITAL_COMMUNITY): Payer: Self-pay

## 2023-04-04 DIAGNOSIS — M545 Low back pain, unspecified: Secondary | ICD-10-CM | POA: Diagnosis not present

## 2023-04-04 DIAGNOSIS — M79602 Pain in left arm: Secondary | ICD-10-CM | POA: Diagnosis not present

## 2023-04-04 DIAGNOSIS — M79601 Pain in right arm: Secondary | ICD-10-CM | POA: Diagnosis not present

## 2023-04-04 NOTE — Progress Notes (Signed)
[facility] PREP Weekly Session  Patient Details  Name: Melinda [name] MRN: [id number] Date of Birth: March 11, 1951 Age: 72 y.o. PCP: Jarold Motto, PA  There were no vitals filed for this visit.   [facility] Weekly seesion - 04/04/23 1500       [facility] "PREP" Location   [facility] "PREP" Location [facility]      Weekly Session   Topic Discussed Goal setting and welcome to the program   Introductions, review of notebook, tour of facility; offered option for intro to cardio machine workout   Classes attended to date 1             Melinda [name] B Phil Michels 04/04/2023, 3:24 PM

## 2023-04-05 DIAGNOSIS — M9904 Segmental and somatic dysfunction of sacral region: Secondary | ICD-10-CM | POA: Diagnosis not present

## 2023-04-05 DIAGNOSIS — G5700 Lesion of sciatic nerve, unspecified lower limb: Secondary | ICD-10-CM | POA: Diagnosis not present

## 2023-04-05 DIAGNOSIS — M9905 Segmental and somatic dysfunction of pelvic region: Secondary | ICD-10-CM | POA: Diagnosis not present

## 2023-04-05 DIAGNOSIS — M810 Age-related osteoporosis without current pathological fracture: Secondary | ICD-10-CM | POA: Diagnosis not present

## 2023-04-05 DIAGNOSIS — M9903 Segmental and somatic dysfunction of lumbar region: Secondary | ICD-10-CM | POA: Diagnosis not present

## 2023-04-05 DIAGNOSIS — M9906 Segmental and somatic dysfunction of lower extremity: Secondary | ICD-10-CM | POA: Diagnosis not present

## 2023-04-05 DIAGNOSIS — I4891 Unspecified atrial fibrillation: Secondary | ICD-10-CM | POA: Diagnosis not present

## 2023-04-05 DIAGNOSIS — E785 Hyperlipidemia, unspecified: Secondary | ICD-10-CM | POA: Diagnosis not present

## 2023-04-06 DIAGNOSIS — Z6824 Body mass index (BMI) 24.0-24.9, adult: Secondary | ICD-10-CM | POA: Diagnosis not present

## 2023-04-06 DIAGNOSIS — R61 Generalized hyperhidrosis: Secondary | ICD-10-CM | POA: Diagnosis not present

## 2023-04-06 DIAGNOSIS — G479 Sleep disorder, unspecified: Secondary | ICD-10-CM | POA: Diagnosis not present

## 2023-04-06 DIAGNOSIS — Z780 Asymptomatic menopausal state: Secondary | ICD-10-CM | POA: Diagnosis not present

## 2023-04-11 DIAGNOSIS — M9901 Segmental and somatic dysfunction of cervical region: Secondary | ICD-10-CM | POA: Diagnosis not present

## 2023-04-11 DIAGNOSIS — M9902 Segmental and somatic dysfunction of thoracic region: Secondary | ICD-10-CM | POA: Diagnosis not present

## 2023-04-11 DIAGNOSIS — M546 Pain in thoracic spine: Secondary | ICD-10-CM | POA: Diagnosis not present

## 2023-04-11 DIAGNOSIS — M9903 Segmental and somatic dysfunction of lumbar region: Secondary | ICD-10-CM | POA: Diagnosis not present

## 2023-04-11 DIAGNOSIS — M542 Cervicalgia: Secondary | ICD-10-CM | POA: Diagnosis not present

## 2023-04-11 DIAGNOSIS — M545 Low back pain, unspecified: Secondary | ICD-10-CM | POA: Diagnosis not present

## 2023-04-11 NOTE — Progress Notes (Signed)
[facility] PREP Weekly Session  Patient Details  Name: DYNASTIE DEMAIO MRN: [id number] Date of Birth: 11-Feb-1951 Age: 72 y.o. PCP: Jarold Motto, PA  Vitals:   04/11/23 1314  Weight: 156 lb 12.8 oz (71.1 kg)     [facility] Weekly seesion - 04/11/23 1300       [facility] "PREP" Location   [facility] "PREP" Location [facility]      Weekly Session   Topic Discussed Importance of resistance training;Other ways to be active   Goal: work up to 150 minutes cardio/wk; strength training: 2-3 times/wk for 20-40 minutes.   Classes attended to date 3             Mariateresa Batra B Laquentin Loudermilk 04/11/2023, 1:15 PM

## 2023-04-13 ENCOUNTER — Other Ambulatory Visit: Payer: Self-pay | Admitting: Sports Medicine

## 2023-04-14 ENCOUNTER — Ambulatory Visit: Payer: Medicare HMO | Attending: Internal Medicine | Admitting: Pharmacist

## 2023-04-14 ENCOUNTER — Encounter: Payer: Self-pay | Admitting: Pharmacist

## 2023-04-14 DIAGNOSIS — E785 Hyperlipidemia, unspecified: Secondary | ICD-10-CM | POA: Diagnosis not present

## 2023-04-14 DIAGNOSIS — R931 Abnormal findings on diagnostic imaging of heart and coronary circulation: Secondary | ICD-10-CM

## 2023-04-14 NOTE — Patient Instructions (Addendum)
Your LDL cholesterol is 149 and your goal is < 70  Continue to work on increased physical activity and eating a heart-healthy diet  You can take berberine supplementation  Recheck fasting labs on November 18th, any time after 7:30am  Zetia (ezetimibe) is a once daily pill that is not a statin. It lowers your LDL cholesterol by about 20-25%. This can be a potential option to try depending on your upcoming lab results

## 2023-04-14 NOTE — Progress Notes (Signed)
Patient ID: Melinda [name]                 DOB: Apr 05, 1951                    MRN: [id number]     HPI: Melinda [name] is a 72 y.o. female patient referred to lipid clinic by Dr Mayford Knife. PMH is significant for HTN, HLD, PAF, OSA, and arthritis. Calcium score on 11/07/22 was elevated at 90.9 (73rd percentile for age, race, and sex-matched controls). Her Lp(a) is mildly elevated at 91.5.  Pt presents today for cholesterol management. She has never taken medication for her cholesterol before. Strongly opposes statins after her research of them. Prefers natural supplements instead and wishes to try berberine.   Current Medications: none Intolerances: none Risk Factors: elevated calcium score, family history of CAD, elevated Lp(a) LDL goal: 70mg /dL  Diet: Avoids red meat. Eats organic, loves avocado and salmon  Exercise: Exercise and strength training class at the [facility]  Family History: Angina in her paternal grandfather; Autoimmune disease in her maternal uncle; Bone cancer in her father; Diabetes in her paternal grandfather; Glaucoma in her maternal aunt and maternal grandmother; Heart disease in her brother; Heart disease (age of onset: 75) in her maternal grandfather; Heart failure in her mother; Hypertension in her brother, maternal aunt, maternal grandfather, and mother; Hypothyroidism in her mother; Kidney Stones in her maternal uncle; Melanoma (age of onset: 88) in her paternal uncle; Mental illness in her paternal aunt and paternal grandmother; Parkinson's disease in her brother and maternal aunt; Prostate cancer in her maternal uncle; Stroke in her maternal uncle and mother.   Social History: No tobacco or drug use, occasional alcohol use.  Labs: 03/20/23: TC 234, TG 129, HDL 62, LDL 149, Lp(a) 91.5  Past Medical History:  Diagnosis Date   Agatston coronary artery calcium score less than 100    Coronary calcium score 90 in April 2024   Arthritis    Back problem    Cataracts, bilateral     Chicken pox    Disorder of adrenal gland (HCC)    Disorders of sulfur-bearing amino-acid metabolism, unspecified (HCC)    Displaced fracture of base of unspecified metacarpal bone, initial encounter for closed fracture    Gallstones    Hyperlipidemia    Hypertension    Insomnia    Iron deficiency anemia, unspecified    mostly vegetarian   Measles    Mumps    Sciatica    Sensitivity to medication    Sprain of unspecified ligament of right ankle, subsequent encounter    Vitamin D deficiency, unspecified     Current Outpatient Medications on File Prior to Visit  Medication Sig Dispense Refill   Acetylcysteine (NAC) 600 MG CAPS Take 1 capsule by mouth daily in the afternoon.     Ascorbic Acid (VITAMIN C PO) Take 1 tablet by mouth daily in the afternoon. Vitamin C and Bio-Quercetin Phytosome     Cholecalciferol (VITAMIN D) 125 MCG (5000 UT) CAPS Take 1 capsule by mouth. Five times a week     Digestive Enzymes (DIGESTIVE ENZYME PO) Take 1 tablet by mouth daily in the afternoon. Primary Digest- Brand     diltiazem (CARDIZEM CD) 120 MG 24 hr capsule Take 1 capsule by mouth once daily 30 capsule 6   diltiazem (CARDIZEM) 30 MG tablet Take 1 tablet every 4 hours AS NEEDED for heart rate >100 30 tablet 1   GLYCINE PO Take  500 mg by mouth. Take 4 tabs at bedtime on an empty stomach or open and melt . May repeat in the middle of the night if needed     Hydroxyapatite-Cholecal-Mg (OSSOPAN MD PO) Take 2.5 mcg by mouth.     magnesium gluconate (MAGONATE) 500 MG tablet Take 500 mg by mouth daily in the afternoon.     MAGNESIUM GLYCINATE PO Take 1 tablet by mouth daily.     melatonin 3 MG TABS tablet Take 6 mg by mouth at bedtime. Take 2 tab at bedtime     Multiple Vitamins-Minerals (MULTIVITAMIN WOMEN PO) Take 1 tablet by mouth daily in the afternoon. Vitamin Code 50 and wiser     NON FORMULARY P 5 P 50 mg  -Taking 1 tablet by mouth once a day     OVER THE COUNTER MEDICATION Take 4 tablets by  mouth daily.     OVER THE COUNTER MEDICATION Take 1 capsule by mouth daily in the afternoon. Unique E-     OVER THE COUNTER MEDICATION Take 1 tablet by mouth daily in the afternoon. MacuGuard Ocular Support     OVER THE COUNTER MEDICATION Take 1 tablet by mouth daily in the afternoon. LV-GB Complex     potassium chloride (KLOR-CON) 10 MEQ tablet Take 1 tablet by mouth twice daily 60 tablet 0   Pyridoxine HCl (VITAMIN B-6 PO) Take 1 capsule by mouth daily in the afternoon. P5P     QUERCETIN PO Take 1 tablet by mouth daily in the afternoon.     rivaroxaban (XARELTO) 20 MG TABS tablet TAKE 1 TABLET BY MOUTH ONCE DAILY WITH SUPPER 30 tablet 6   UNABLE TO FIND CBD oil 1000mg      UNABLE TO FIND Med Name: Betaplex     VITAMIN K PO Take 1 tablet by mouth in the morning and at bedtime. Potassium     No current facility-administered medications on file prior to visit.    Allergies  Allergen Reactions   Pseudoephedrine Other (See Comments)    Hyper active     Assessment/Plan:  1. Hyperlipidemia - LDL 149 above goal < 70 due to elevated calcium score, FHx of CAD, and mildly elevated Lp(a). Reviewed options for lowering LDL cholesterol, including statins, ezetimibe, and injectable therapy. Discussed less regulation of OTC products which are not required to show safety or efficacy data prior to marketing. Pt is very opposed to taking a statin. She wishes to try berberine for her cholesterol. Has also been increasing physical activity. She already eats a heart healthy diet. Will recheck lipids in 2 months. Requires 50% LDL lowering to reach her goal. Prescription medication option would be limited to Zetia as insurance would not cover any branded meds without her taking a statin first.  Melinda [name] E. Urho Rio, PharmD, BCACP, CPP [facility] [address] [place], [place] [address] Phone: [phone]; Fax: [phone] 04/14/2023 11:23 AM

## 2023-04-15 DIAGNOSIS — M545 Low back pain, unspecified: Secondary | ICD-10-CM | POA: Diagnosis not present

## 2023-04-15 DIAGNOSIS — M79602 Pain in left arm: Secondary | ICD-10-CM | POA: Diagnosis not present

## 2023-04-15 DIAGNOSIS — M79601 Pain in right arm: Secondary | ICD-10-CM | POA: Diagnosis not present

## 2023-04-18 ENCOUNTER — Telehealth: Payer: Self-pay

## 2023-04-18 ENCOUNTER — Other Ambulatory Visit: Payer: Self-pay

## 2023-04-18 DIAGNOSIS — M810 Age-related osteoporosis without current pathological fracture: Secondary | ICD-10-CM | POA: Insufficient documentation

## 2023-04-18 NOTE — Telephone Encounter (Signed)
Auth Submission: PENDING Site of care: Site of care: [facility] Payer: [employer] medicare Medication & CPT/J Code(s) submitted: Prolia (Denosumab) [id number] Route of submission (phone, fax, portal): portal Phone # Fax # Auth type:  Units/visits requested: 60mg , q63months Reference number: [id number]

## 2023-04-18 NOTE — Progress Notes (Signed)
[facility] PREP Weekly Session  Patient Details  Name: Melinda [name] MRN: [id number] Date of Birth: Dec 23, 1950 Age: 72 y.o. PCP: Jarold Motto, PA  Vitals:   04/18/23 1320  Weight: 157 lb (71.2 kg)     [facility] Weekly seesion - 04/18/23 1300       [facility] "PREP" Location   [facility] "PREP" Location [facility]      Weekly Session   Topic Discussed Healthy eating tips   Foods to reduce, foods to increase; introduced YUKA app   Minutes exercised this week 225 minutes    Classes attended to date 5             Zyliah Schier B Fawaz Borquez 04/18/2023, 1:21 PM

## 2023-04-25 NOTE — Progress Notes (Signed)
[facility] PREP Weekly Session  Patient Details  Name: Melinda [name] MRN: [id number] Date of Birth: 1951-01-16 Age: 72 y.o. PCP: Jarold Motto, PA  Vitals:   04/25/23 1322  Weight: 157 lb (71.2 kg)     [facility] Weekly seesion - 04/25/23 1300       [facility] "PREP" Location   [facility]PREP[facility] Location [facility]      Weekly Session   Topic Discussed Health habits   Tips for maintaining healthly lifestyle; sugar demo; encouraged to limit added sugars to 24 gms/day   Minutes exercised this week 175 minutes    Classes attended to date 7             Hinata Diener B Makai Dumond 04/25/2023, 1:23 PM

## 2023-04-28 ENCOUNTER — Ambulatory Visit (INDEPENDENT_AMBULATORY_CARE_PROVIDER_SITE_OTHER): Payer: Medicare HMO

## 2023-04-28 VITALS — BP 130/73 | HR 65 | Temp 97.6°F | Resp 16 | Ht 67.0 in | Wt 158.0 lb

## 2023-04-28 DIAGNOSIS — M810 Age-related osteoporosis without current pathological fracture: Secondary | ICD-10-CM | POA: Diagnosis not present

## 2023-04-28 MED ORDER — DENOSUMAB 60 MG/ML ~~LOC~~ SOSY
60.0000 mg | PREFILLED_SYRINGE | Freq: Once | SUBCUTANEOUS | Status: AC
  Administered 2023-04-28: 60 mg via SUBCUTANEOUS
  Filled 2023-04-28: qty 1

## 2023-04-28 NOTE — Progress Notes (Signed)
Diagnosis: Osteoporosis  Provider:  Chilton Greathouse MD  Procedure: Injection  Prolia (Denosumab), Dose: 60 mg, Site: subcutaneous, Number of injections: 1  Post Care: Observation period completed  Discharge: Condition: Good, Destination: Home . AVS Provided  Performed by:  Loney Hering, LPN

## 2023-05-02 NOTE — Progress Notes (Signed)
[facility] PREP Weekly Session  Patient Details  Name: Melinda [name] MRN: [id number] Date of Birth: November 13, 1950 Age: 72 y.o. PCP: Jarold Motto, PA  Vitals:   05/02/23 1323  Weight: 157 lb (71.2 kg)     [facility] Weekly seesion - 05/02/23 1300       [facility] "PREP" Location   [facility]PREP[facility] Location [facility]      Weekly Session   Topic Discussed Restaurant Eating   Salt demo; limit sodium intake to 1500-2300 mg/day   Minutes exercised this week 175 minutes    Classes attended to date 16             Journey Castonguay B Kateria Cutrona 05/02/2023, 1:24 PM

## 2023-05-04 ENCOUNTER — Encounter: Payer: Self-pay | Admitting: Cardiovascular Disease

## 2023-05-04 ENCOUNTER — Ambulatory Visit: Payer: Medicare HMO | Attending: Cardiovascular Disease | Admitting: Cardiovascular Disease

## 2023-05-04 VITALS — BP 136/88 | HR 71 | Ht 67.0 in | Wt 157.6 lb

## 2023-05-04 DIAGNOSIS — I480 Paroxysmal atrial fibrillation: Secondary | ICD-10-CM | POA: Diagnosis not present

## 2023-05-04 NOTE — Progress Notes (Signed)
Electrophysiology Office Note:    Date:  05/04/2023   ID:  Melinda [name], Melinda [name] 05/07/1951, MRN [id number]  PCP:  Jarold Motto, PA   [facility] Providers Cardiologist:  None     Referring MD: Danice Goltz, PA   History of Present Illness:    Melinda [name] is a 72 y.o. female with a medical history significant for paroxysmal atrial fibrillation, sleep apnea, hypertension, hyperlipidemia, referred for atrial fibrillation management.     She was initially diagnosed with atrial fibrillation and flutter in June 2022 after presenting to her PCP with palpitations.  ECG showed rapid atrial flutter initially, but eventually atrial fibrillation was detected as well.  She has noticed increasing frequency and duration of palpitations, associated with fatigue.  Sometimes she is unable to work.  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     Today, she reports that she is feeling well  EKGs/Labs/Other Studies Reviewed Today:     Echocardiogram:  TTE March 02, 2021 Left ventricular ejection fraction 60%.  RV systolic function normal.  Atria are normal in size   Monitors:   Stress testing:   Advanced imaging:  Cardiac CT 11/26/2022 Coronary calcium score of 90.9, 73rd percentile for age and race and sex matched controls  Cardiac catherization   EKG:   EKG Interpretation Date/Time:  Thursday May 04 2023 15:26:31 EDT Ventricular Rate:  61 PR Interval:  148 QRS Duration:  78 QT Interval:  434 QTC Calculation: 436 R Axis:   34  Text Interpretation: Normal sinus rhythm Normal ECG When compared with ECG of 09-Mar-2023 11:13, No significant change was found Confirmed by York Pellant [phone]) on 05/04/2023 3:33:02 PM     Physical Exam:    VS:  BP 136/88   Pulse 71   Ht 5\' 7"  (1.702 m)   Wt 157 lb 9.6 oz (71.5 kg)   LMP 08/04/2009   SpO2 98%   BMI 24.68 kg/m     Wt Readings from Last 3 Encounters:   05/04/23 157 lb 9.6 oz (71.5 kg)  05/02/23 157 lb (71.2 kg)  04/28/23 158 lb (71.7 kg)     GEN:  Well nourished, well developed in no acute distress CARDIAC: RRR, no murmurs, rubs, gallops RESPIRATORY:  Normal work of breathing MUSCULOSKELETAL: no edema    ASSESSMENT & PLAN:      Paroxysmal atrial fibrillation Symptomatic rates not well-controlled I think rhythm control is indicated because she is symptomatic We discussed options.  I recommended ablation She would like to continue weight loss efforts and continue evaluation for sleep apnea We are going to go ahead and schedule her for the A-fib ablation procedure, and reevaluate closure of the procedure time Continue diltiazem 120 with diltiazem 30 as needed for heart rates greater than 100 Continue home monitoring with Apple Watch  Mild sleep apnea She has tried and manages this with with a weight loss approach  Secondary hypercoagulable state Continue Xarelto 20  Coronary artery disease Coronary score of 90 She is declining statin at this time   Signed, Maurice Small, MD  05/04/2023 3:34 PM    [facility]

## 2023-05-04 NOTE — Patient Instructions (Signed)
Medication Instructions:  Your physician recommends that you continue on your current medications as directed. Please refer to the Current Medication list given to you today.  *If you need a refill on your cardiac medications before your next appointment, please call your pharmacy*   Lab Work: None ordered.  If you have labs (blood work) drawn today and your tests are completely normal, you will receive your results only by: MyChart Message (if you have MyChart) OR A paper copy in the mail If you have any lab test that is abnormal or we need to change your treatment, we will call you to review the results.   Testing/Procedures: Your physician has recommended that you have an ablation. Catheter ablation is a medical procedure used to treat some cardiac arrhythmias (irregular heartbeats). During catheter ablation, a long, thin, flexible tube is put into a blood vessel in your groin (upper thigh), or neck. This tube is called an ablation catheter. It is then guided to your heart through the blood vessel. Radio frequency waves destroy small areas of heart tissue where abnormal heartbeats may cause an arrhythmia to start. Please see the instruction sheet given to you today.     Follow-Up: At [facility], you and your health needs are our priority.  As part of our continuing mission to provide you with exceptional heart care, we have created designated Provider Care Teams.  These Care Teams include your primary Cardiologist (physician) and Advanced Practice Providers (APPs -  Physician Assistants and Nurse Practitioners) who all work together to provide you with the care you need, when you need it.  We recommend signing up for the patient portal called "MyChart".  Sign up information is provided on this After Visit Summary.  MyChart is used to connect with patients for Virtual Visits (Telemedicine).  Patients are able to view lab/test results, encounter notes, upcoming appointments, etc.   Non-urgent messages can be sent to your provider as well.   To learn more about what you can do with MyChart, go to [email or web address].    Your next appointment:   To be scheduled

## 2023-05-15 DIAGNOSIS — I4891 Unspecified atrial fibrillation: Secondary | ICD-10-CM | POA: Diagnosis not present

## 2023-05-15 DIAGNOSIS — M810 Age-related osteoporosis without current pathological fracture: Secondary | ICD-10-CM | POA: Diagnosis not present

## 2023-05-15 DIAGNOSIS — M5430 Sciatica, unspecified side: Secondary | ICD-10-CM | POA: Diagnosis not present

## 2023-05-15 DIAGNOSIS — R42 Dizziness and giddiness: Secondary | ICD-10-CM | POA: Diagnosis not present

## 2023-05-16 NOTE — Progress Notes (Signed)
[facility] PREP Weekly Session  Patient Details  Name: Melinda [name] MRN: [id number] Date of Birth: [birth year] Age: 72 y.o. PCP: Jarold Motto, PA  Vitals:   05/16/23 1258  Weight: 159 lb (72.1 kg)     [facility] Weekly seesion - 05/16/23 1200       [facility] "PREP" Location   [facility] "PREP" Location [facility]      Weekly Session   Topic Discussed Expectations and non-scale victories   Halfway through program, revisit/re-state/review goals; Staying positive   Minutes exercised this week 370 minutes    Classes attended to date 62             Lakin Rhine B Metztli Sachdev 05/16/2023, 1:00 PM

## 2023-05-17 DIAGNOSIS — Z85828 Personal history of other malignant neoplasm of skin: Secondary | ICD-10-CM | POA: Diagnosis not present

## 2023-05-17 DIAGNOSIS — D2262 Melanocytic nevi of left upper limb, including shoulder: Secondary | ICD-10-CM | POA: Diagnosis not present

## 2023-05-17 DIAGNOSIS — L814 Other melanin hyperpigmentation: Secondary | ICD-10-CM | POA: Diagnosis not present

## 2023-05-17 DIAGNOSIS — L821 Other seborrheic keratosis: Secondary | ICD-10-CM | POA: Diagnosis not present

## 2023-05-17 DIAGNOSIS — D1801 Hemangioma of skin and subcutaneous tissue: Secondary | ICD-10-CM | POA: Diagnosis not present

## 2023-05-17 DIAGNOSIS — D225 Melanocytic nevi of trunk: Secondary | ICD-10-CM | POA: Diagnosis not present

## 2023-05-17 DIAGNOSIS — D2271 Melanocytic nevi of right lower limb, including hip: Secondary | ICD-10-CM | POA: Diagnosis not present

## 2023-05-17 DIAGNOSIS — L570 Actinic keratosis: Secondary | ICD-10-CM | POA: Diagnosis not present

## 2023-05-17 DIAGNOSIS — D2272 Melanocytic nevi of left lower limb, including hip: Secondary | ICD-10-CM | POA: Diagnosis not present

## 2023-05-23 NOTE — Progress Notes (Signed)
[facility] PREP Weekly Session  Patient Details  Name: Melinda [name] MRN: [id number] Date of Birth: [birth year] Age: 72 y.o. PCP: Jarold Motto, PA  Vitals:   05/23/23 1319  Weight: 160 lb (72.6 kg)     [facility] Weekly seesion - 05/23/23 1300       [facility] "PREP" Location   [facility] "PREP" Location [facility]      Weekly Session   Topic Discussed Other   Portion control, visualize your portion size demo; review of Red Sugar Craisins nutrition label.   Minutes exercised this week 230 minutes    Classes attended to date 33             Benay Pomeroy B Blima Jaimes 05/23/2023, 1:19 PM

## 2023-05-24 ENCOUNTER — Telehealth: Payer: Self-pay

## 2023-05-24 NOTE — Telephone Encounter (Signed)
Attempted to contact patient to schedule ablation, patient driving but will call office back at a better time for her.

## 2023-05-30 DIAGNOSIS — R61 Generalized hyperhidrosis: Secondary | ICD-10-CM | POA: Diagnosis not present

## 2023-05-30 DIAGNOSIS — Z6824 Body mass index (BMI) 24.0-24.9, adult: Secondary | ICD-10-CM | POA: Diagnosis not present

## 2023-05-30 DIAGNOSIS — N951 Menopausal and female climacteric states: Secondary | ICD-10-CM | POA: Diagnosis not present

## 2023-05-30 DIAGNOSIS — G479 Sleep disorder, unspecified: Secondary | ICD-10-CM | POA: Diagnosis not present

## 2023-06-06 NOTE — Progress Notes (Signed)
[facility] PREP Weekly Session  Patient Details  Name: Melinda [name] MRN: [id number] Date of Birth: [birth year] Age: 72 y.o. PCP: Jarold Motto, PA  Vitals:   06/06/23 1310  Weight: 157 lb (71.2 kg)     [facility] Weekly seesion - 06/06/23 1300       [facility] "PREP" Location   [facility] "PREP" Location [facility]      Weekly Session   Topic Discussed Calorie breakdown   Carbs, proteins, fats---review of current USDA's recommendations; simple vs. complex carbs   Minutes exercised this week 300 minutes    Classes attended to date 70             Atiya Yera B Margrett Kalb 06/06/2023, 1:11 PM

## 2023-06-13 NOTE — Progress Notes (Signed)
[name] PREP Weekly Session  Patient Details  Name: Melinda [name] MRN: [id number] Date of Birth: [birth year] Age: 72 y.o. PCP: Jarold Motto, PA  Vitals:   06/13/23 1330  Weight: 156 lb 9.6 oz (71 kg)     [name] Weekly seesion - 06/13/23 1300       [name] "PREP" Location   [name] "PREP" Location Melinda [name]      Weekly Session   Topic Discussed Hitting roadblocks   Membership talk with Melinda [name] and Melinda [name]; completing goals and activity plan for next 90 days   Minutes exercised this week 160 minutes    Classes attended to date 29             Melinda [name] Melinda [name] 06/13/2023, 1:31 PM

## 2023-06-20 NOTE — Progress Notes (Signed)
[facility] PREP Weekly Session  Patient Details  Name: Melinda [name] MRN: [id number] Date of Birth: 1950-12-19 Age: 72 y.o. PCP: Jarold Motto, PA  Vitals:   06/20/23 1303  Weight: 158 lb 3.2 oz (71.8 kg)     [facility] Weekly seesion - 06/20/23 1300       [facility] "PREP" Location   [facility] "PREP" Location [facility]      Weekly Session   Topic Discussed Other   How fit and strong survey completed; fit testing completed; final assessment visit scheduled, review of goals and activity plan and PREP survey to bring to final visit.   Minutes exercised this week 300 minutes    Classes attended to date 40             Garnell Phenix B [name] 06/20/2023, 1:04 PM

## 2023-06-22 ENCOUNTER — Other Ambulatory Visit: Payer: Self-pay | Admitting: Pharmacist

## 2023-06-22 DIAGNOSIS — E785 Hyperlipidemia, unspecified: Secondary | ICD-10-CM

## 2023-06-22 NOTE — Progress Notes (Signed)
[facility] PREP Evaluation  Patient Details  Name: Melinda [name] MRN: [id number] Date of Birth: Jun 12, 1951 Age: 72 y.o. PCP: Jarold Motto, PA  Vitals:   06/22/23 1210  BP: (!) 140/76  Pulse: 90  SpO2: 98%  Weight: 156 lb 12.8 oz (71.1 kg)     [facility] Eval - 06/22/23 1200       [facility] "PREP" Location   [facility] "PREP" Location [facility]      Referral    Program Start Date 04/04/23    Program End Date 06/22/23      Measurement   Waist Circumference 35.5 inches    Waist Circumference End Program 34.5 inches    Hip Circumference 40.5 inches    Hip Circumference End Program 40 inches    Body fat 34.1 percent      Mobility and Daily Activities   I find it easy to walk up or down two or more flights of stairs. 4    I have no trouble taking out the trash. 4    I do housework such as vacuuming and dusting on my own without difficulty. 4    I can easily lift a gallon of milk (8lbs). 4    I can easily walk a mile. 4    I have no trouble reaching into high cupboards or reaching down to pick up something from the floor. 4    I do not have trouble doing out-door work such as Loss adjuster, chartered, raking leaves, or gardening. 4      Mobility and Daily Activities   I feel younger than my age. 4    I feel independent. 4    I feel energetic. 3    I live an active life.  3    I feel strong. 4    I feel healthy. 3    I feel active as other people my age. 4      How fit and strong are you.   Fit and Strong Total Score 53            Past Medical History:  Diagnosis Date   Agatston coronary artery calcium score less than 100    Coronary calcium score 90 in April 2024   Arthritis    Back problem    Cataracts, bilateral    Chicken pox    Disorder of adrenal gland (HCC)    Disorders of sulfur-bearing amino-acid metabolism, unspecified (HCC)    Displaced fracture of base of unspecified metacarpal bone, initial encounter for closed fracture    Gallstones    Hyperlipidemia     Hypertension    Insomnia    Iron deficiency anemia, unspecified    mostly vegetarian   Measles    Mumps    Sciatica    Sensitivity to medication    Sprain of unspecified ligament of right ankle, subsequent encounter    Vitamin D deficiency, unspecified    No past surgical history on file. Social History   Tobacco Use  Smoking Status Never  Smokeless Tobacco Never  Tobacco Comments   Never smoke 12/28/21  Inches lost: 1.5 How fit and strong survey: 03/30/23: 45 05/22/23: 53 Education sessions completed: 10 Strength training sessions completed: 9   Sharryn Belding B Quetzal Meany 06/22/2023, 12:11 PM

## 2023-06-26 ENCOUNTER — Ambulatory Visit: Payer: Medicare HMO | Attending: Internal Medicine

## 2023-06-26 DIAGNOSIS — E785 Hyperlipidemia, unspecified: Secondary | ICD-10-CM

## 2023-06-26 LAB — HEPATIC FUNCTION PANEL
ALT: 23 [IU]/L (ref 0–32)
AST: 25 [IU]/L (ref 0–40)
Albumin: 4.3 g/dL (ref 3.9–4.9)
Alkaline Phosphatase: 70 [IU]/L (ref 44–121)
Bilirubin Total: 0.5 mg/dL (ref 0.0–1.2)
Bilirubin, Direct: 0.14 mg/dL (ref 0.00–0.40)
Total Protein: 6.4 g/dL (ref 6.0–8.5)

## 2023-06-26 LAB — LIPID PANEL
Chol/HDL Ratio: 3.4 ratio (ref 0.0–4.4)
Cholesterol, Total: 206 mg/dL — ABNORMAL HIGH (ref 100–199)
HDL: 60 mg/dL (ref >39)
LDL Chol Calc (NIH): 132 mg/dL — ABNORMAL HIGH (ref 0–99)
Triglycerides: 76 mg/dL (ref 0–149)
VLDL Cholesterol Cal: 14 mg/dL (ref 5–40)

## 2023-06-27 ENCOUNTER — Telehealth: Payer: Self-pay | Admitting: Pharmacist

## 2023-06-27 NOTE — Telephone Encounter (Signed)
Melinda [name], PharmD saw patient on 04/14/2023. Patient agreed on taking ezetimibe 10 mg. F/u lipid and hepatic lab done yesterday. Call patient to discuss lipid lab result. Patient reports she is not taking Zetia, she has only been taking Dihydroberberine 100 mg once daily. Her LDL  was 149 -3 months ago and now it is 132 mg/dl - still above goal (<59 mg/dl). Patient refuse to take any statins or any cholesterol medication other than Dihydroberberine. She want [place] to let Dr. Mayford Knife know about this too.

## 2023-07-13 ENCOUNTER — Telehealth: Payer: Self-pay | Admitting: Internal Medicine

## 2023-07-13 NOTE — Telephone Encounter (Signed)
Pt c/o medication issue:  1. Name of Medication: Low dose beta blocker  2. How are you currently taking this medication (dosage and times per day)? Not currently taking   3. Are you having a reaction (difficulty breathing--STAT)? No   4. What is your medication issue? Patient states she has a performance Saturday morning that she feels she may need an additional low dose beta blocker prescribed for incase of her having an episode due to it. Please advise.

## 2023-07-14 MED ORDER — METOPROLOL TARTRATE 25 MG PO TABS: 25.0000 mg | ORAL_TABLET | Freq: Four times a day (QID) | ORAL | 1 refills | Status: AC | PRN

## 2023-07-14 NOTE — Telephone Encounter (Signed)
Discussed with Jorja Loa PA - pt inquired whether PRN beta blocker may help control breakthrough afib better than PRN cardizem as the pt usually requires multiples doses to convert her. Pt has never been prescribed a Beta blocker before but has friends on [place] which prompted her call. PRN metoprolol tartrate 25mg  to use every 6 hours as needed per Jorja Loa. Pt in agreement.

## 2023-07-28 ENCOUNTER — Telehealth: Payer: Self-pay | Admitting: Cardiovascular Disease

## 2023-07-28 ENCOUNTER — Telehealth: Payer: Self-pay

## 2023-07-28 DIAGNOSIS — I480 Paroxysmal atrial fibrillation: Secondary | ICD-10-CM

## 2023-07-28 NOTE — Telephone Encounter (Signed)
Patient called to say that she needs to have her ablation done the week of 24th in March. Please advise

## 2023-07-28 NOTE — Telephone Encounter (Signed)
Spoke with patient AF ablation scheduled for 10/20/22, office visit with Mealor on 09/29/23 - no further needs at this time

## 2023-08-01 NOTE — Telephone Encounter (Signed)
Spoke with patient concerning ablation, would like to move ablation further back to 11/01/23. Rescheduled office visit with Mealor on 10/06/23 and sent message to have CT rescheduled. No further needs at this time

## 2023-08-04 DIAGNOSIS — N951 Menopausal and female climacteric states: Secondary | ICD-10-CM | POA: Diagnosis not present

## 2023-08-04 DIAGNOSIS — G479 Sleep disorder, unspecified: Secondary | ICD-10-CM | POA: Diagnosis not present

## 2023-08-04 DIAGNOSIS — Z6825 Body mass index (BMI) 25.0-25.9, adult: Secondary | ICD-10-CM | POA: Diagnosis not present

## 2023-08-04 DIAGNOSIS — R61 Generalized hyperhidrosis: Secondary | ICD-10-CM | POA: Diagnosis not present

## 2023-09-18 ENCOUNTER — Ambulatory Visit (INDEPENDENT_AMBULATORY_CARE_PROVIDER_SITE_OTHER): Payer: Medicare HMO

## 2023-09-18 VITALS — Wt 156.0 lb

## 2023-09-18 DIAGNOSIS — Z0000 Encounter for general adult medical examination without abnormal findings: Secondary | ICD-10-CM

## 2023-09-18 NOTE — Progress Notes (Signed)
 Subjective:   Melinda [name] is a 73 y.o. female who presents for Medicare Annual (Subsequent) preventive examination.  Visit Complete: Virtual I connected with  IYMONA [name] on 09/18/23 by a audio enabled telemedicine application and verified that I am speaking with the correct person using two identifiers.  Patient Location: Home  Provider Location: Office/Clinic  I discussed the limitations of evaluation and management by telemedicine. The patient expressed understanding and agreed to proceed.  Vital Signs: Because this visit was a virtual/telehealth visit, some criteria may be missing or patient reported. Any vitals not documented were not able to be obtained and vitals that have been documented are patient reported.  Patient Medicare AWV questionnaire was completed by the patient on 09/18/23; I have confirmed that all information answered by patient is correct and no changes since this date.  Cardiac Risk Factors include: advanced age (>36men, >50 women)     Objective:    Today's Vitals   09/18/23 1008  Weight: 156 lb (70.8 kg)   Body mass index is 24.43 kg/m.     09/18/2023   10:16 AM 06/17/2022   12:10 PM 05/20/2022    7:41 PM 02/04/2021    5:33 PM 02/12/2019   10:02 AM  Advanced Directives  Does Patient Have a Medical Advance Directive? No Yes No No No  Type of Special educational needs teacher of [place];Living will     Copy of Healthcare Power of Attorney in Chart?  No - copy requested     Would patient like information on creating a medical advance directive? No - Patient declined  No - Patient declined  No - Patient declined    Current Medications (verified) Outpatient Encounter Medications as of 09/18/2023  Medication Sig   Acetylcysteine (NAC) 600 MG CAPS Take 1 capsule by mouth daily in the afternoon.   Ascorbic Acid (VITAMIN C PO) Take 1 tablet by mouth daily in the afternoon. Vitamin C and Bio-Quercetin Phytosome   Cholecalciferol (VITAMIN D ) 125 MCG  (5000 UT) CAPS Take 1 capsule by mouth. Five times a week   denosumab  (PROLIA ) 60 MG/ML SOSY injection Inject 60 mg into the skin every 6 (six) months.   Digestive Enzymes (DIGESTIVE ENZYME PO) Take 1 tablet by mouth daily in the afternoon. Primary Digest- Brand   DIHYDROBERBERINE PO Take 1 tablet by mouth daily.   diltiazem  (CARDIZEM  CD) 120 MG 24 hr capsule Take 1 capsule by mouth once daily   GLYCINE PO Take 500 mg by mouth. Take 4 tabs at bedtime on an empty stomach or open and melt . May repeat in the middle of the night if needed   magnesium gluconate (MAGONATE) 500 MG tablet Take 500 mg by mouth daily in the afternoon.   MAGNESIUM GLYCINATE PO Take 1 tablet by mouth daily.   melatonin 3 MG TABS tablet Take 6 mg by mouth at bedtime. Take 2 tab at bedtime   metoprolol  tartrate (LOPRESSOR ) 25 MG tablet Take 1 tablet (25 mg total) by mouth every 6 (six) hours as needed (breakthrough afib).   Multiple Vitamins-Minerals (MULTIVITAMIN WOMEN PO) Take 1 tablet by mouth daily in the afternoon. Vitamin Code 50 and wiser   NON FORMULARY P 5 P 50 mg  -Taking 1 tablet by mouth once a day   OVER THE COUNTER MEDICATION Take 4 tablets by mouth daily.   OVER THE COUNTER MEDICATION Take 1 capsule by mouth daily in the afternoon. Unique E-   OVER THE COUNTER MEDICATION  Take 1 tablet by mouth daily in the afternoon. MacuGuard Ocular Support   OVER THE COUNTER MEDICATION Take 1 tablet by mouth daily in the afternoon. LV-GB Complex   progesterone (PROMETRIUM) 100 MG capsule Take 100-300 mg by mouth at bedtime.   Pyridoxine HCl (VITAMIN B-6 PO) Take 1 capsule by mouth daily in the afternoon. P5P   QUERCETIN PO Take 1 tablet by mouth daily in the afternoon.   rivaroxaban  (XARELTO ) 20 MG TABS tablet TAKE 1 TABLET BY MOUTH ONCE DAILY WITH SUPPER   UNABLE TO FIND CBD oil 1000mg    UNABLE TO FIND Med Name: Betaplex   VITAMIN K PO Take 1 tablet by mouth in the morning and at bedtime. Potassium   [DISCONTINUED]  Hydroxyapatite-Cholecal-Mg (OSSOPAN MD PO) Take 2.5 mcg by mouth.   [DISCONTINUED] potassium chloride  (KLOR-CON ) 10 MEQ tablet Take 1 tablet by mouth twice daily   No facility-administered encounter medications on file as of 09/18/2023.    Allergies (verified) Pseudoephedrine   History: Past Medical History:  Diagnosis Date   Agatston coronary artery calcium score less than 100    Coronary calcium score 90 in April 2024   Arthritis    Back problem    Cataracts, bilateral    Chicken pox    Disorder of adrenal gland (HCC)    Disorders of sulfur-bearing amino-acid metabolism, unspecified (HCC)    Displaced fracture of base of unspecified metacarpal bone, initial encounter for closed fracture    Gallstones    Hyperlipidemia    Hypertension    Insomnia    Iron deficiency anemia, unspecified    mostly vegetarian   Measles    Mumps    Sciatica    Sensitivity to medication    Sprain of unspecified ligament of right ankle, subsequent encounter    Vitamin D  deficiency, unspecified    History reviewed. No pertinent surgical history. Family History  Problem Relation Age of Onset   Hypertension Mother    Hypothyroidism Mother    Heart failure Mother    Stroke Mother    Bone cancer Father    Heart disease Maternal Grandfather 71   Hypertension Maternal Grandfather    Glaucoma Maternal Grandmother    Mental illness Paternal Grandmother    Diabetes Paternal Grandfather    Angina Paternal Grandfather    Heart disease Brother    Hypertension Brother    Melanoma Paternal Uncle 75   Mental illness Paternal Aunt    Autoimmune disease Maternal Uncle    Prostate cancer Maternal Uncle    Kidney Stones Maternal Uncle    Stroke Maternal Uncle    Parkinson's disease Maternal Aunt    Hypertension Maternal Aunt    Glaucoma Maternal Aunt    Parkinson's disease Brother    Social History   Socioeconomic History   Marital status: Married    Spouse name: Not on file   Number of  children: Not on file   Years of education: Not on file   Highest education level: Not on file  Occupational History   Occupation: FREELANCE VIOLINIST   Occupation: TEACHER  Tobacco Use   Smoking status: Never   Smokeless tobacco: Never   Tobacco comments:    Never smoke 12/28/21  Substance and Sexual Activity   Alcohol use: Not Currently    Alcohol/week: 1.0 standard drink of alcohol    Types: 1 Standard drinks or equivalent per week    Comment: very small amount occasionally 09/29/21   Drug use: No  Sexual activity: Never  Other Topics Concern   Not on file  Social History Narrative   Freelance violinist   Lives with husband   2 children -- remain local   No grandchildren      Prefers Integrative Medicine, but okay with a DO. Previously saw Dr. Vaughn. Pescatarian for the most part. Intermittent fasting with a 6 hour eating window. Hx of feeling sick after flu shot in the past, so declines flu shot. Hep C in 2013. Hx of breast biopsy. Hx of hypokalemia, previously on KLC 10 mEQ daily. Patient wants to lose 20 pounds in order to get BP at goal rather than try medication. FamHx of HTN. Previous exercise included HIIT at the gym. During COVID, has started baking more and not exercising as much. 03/03/2019    Social Drivers of Health   Financial Resource Strain: Low Risk  (09/18/2023)   Overall Financial Resource Strain (CARDIA)    Difficulty of Paying Living Expenses: Not hard at all  Food Insecurity: No Food Insecurity (09/18/2023)   Hunger Vital Sign    Worried About Running Out of Food in the Last Year: Never true    Ran Out of Food in the Last Year: Never true  Transportation Needs: No Transportation Needs (09/18/2023)   PRAPARE - Administrator, Civil Service (Medical): No    Lack of Transportation (Non-Medical): No  Physical Activity: Insufficiently Active (09/18/2023)   Exercise Vital Sign    Days of Exercise per Week: 3 days    Minutes of Exercise per  Session: 30 min  Stress: No Stress Concern Present (09/18/2023)   [employer] of Occupational Health - Occupational Stress Questionnaire    Feeling of Stress : Not at all  Social Connections: Moderately Integrated (09/18/2023)   Social Connection and Isolation Panel [NHANES]    Frequency of Communication with Friends and Family: More than three times a week    Frequency of Social Gatherings with Friends and Family: More than three times a week    Attends Religious Services: Never    Database administrator or Organizations: No    Attends Engineer, structural: 1 to 4 times per year    Marital Status: Married    Tobacco Counseling Counseling given: Not Answered Tobacco comments: Never smoke 12/28/21   Clinical Intake:  Pre-visit preparation completed: Yes  Pain : No/denies pain     BMI - recorded: 24.43 Nutritional Status: BMI of 19-24  Normal Nutritional Risks: None Diabetes: No  How often do you need to have someone help you when you read instructions, pamphlets, or other written materials from your doctor or pharmacy?: 1 - Never  Interpreter Needed?: No  Information entered by :: Lamont Pilsner, LPN   Activities of Daily Living    09/18/2023    9:48 AM  In your present state of health, do you have any difficulty performing the following activities:  Hearing? 0  Vision? 0  Difficulty concentrating or making decisions? 0  Walking or climbing stairs? 0  Dressing or bathing? 0  Doing errands, shopping? 0  Preparing Food and eating ? N  Using the Toilet? N  In the past six months, have you accidently leaked urine? N  Do you have problems with loss of bowel control? N  Managing your Medications? N  Managing your Finances? N  Housekeeping or managing your Housekeeping? N    Patient Care Team: Alexander Iba, [place] as PCP - General (Physician Assistant) End, Veryl Gottron,  MD as Consulting Physician (Cardiology) Reynold Caves, MD as Consulting Physician  (Otolaryngology) Loa Riling, DO as Consulting Physician (Obstetrics and Gynecology) Jonn Nett, DO (Family Medicine)  Indicate any recent Medical Services you may have received from other than [facility] providers in the past year (date may be approximate).     Assessment:   This is a routine wellness examination for Jemmie.  Hearing/Vision screen Hearing Screening - Comments:: Pt denies any hearing issues  Vision Screening - Comments:: Pt follows up with Dr Nelva Bang for annual eye exams    Goals Addressed             This Visit's Progress    Patient Stated       Patient stated to lose 15 lbs        Depression Screen    09/18/2023   10:14 AM 03/23/2023    1:12 PM 12/29/2022    9:06 AM 12/05/2022    1:11 PM 06/17/2022   12:09 PM 11/01/2021   10:24 AM 09/15/2021    2:27 PM  PHQ 2/9 Scores  PHQ - 2 Score 0 0 0 0 0 0 0  PHQ- 9 Score  2 2        Fall Risk    09/18/2023    9:48 AM 03/23/2023    1:11 PM 12/29/2022    9:07 AM 12/05/2022    1:11 PM 06/17/2022   12:11 PM  Fall Risk   Falls in the past year? 0 0 1 1 1   Number falls in past yr: 0 0 1 1 1   Injury with Fall? 0 0 1 1 1   Comment     left side wrist and hand  Risk for fall due to : No Fall Risks  History of fall(s) Impaired balance/gait Impaired vision  Follow up Falls prevention discussed  Falls evaluation completed Falls evaluation completed Falls prevention discussed    MEDICARE RISK AT HOME: Medicare Risk at Home Any stairs in or around the home?: (Patient-Rptd) No Home free of loose throw rugs in walkways, pet beds, electrical cords, etc?: (Patient-Rptd) No Adequate lighting in your home to reduce risk of falls?: (Patient-Rptd) Yes Life alert?: (Patient-Rptd) No Use of a cane, walker or w/c?: (Patient-Rptd) No Grab bars in the bathroom?: (Patient-Rptd) No Shower chair or bench in shower?: (Patient-Rptd) No Elevated toilet seat or a handicapped toilet?: (Patient-Rptd) No  TIMED UP AND GO:  Was  the test performed?  No    Cognitive Function:    02/12/2019   10:03 AM  MMSE - Mini Mental State Exam  Orientation to time 5  Orientation to Place 5  Registration 3  Attention/ Calculation 5  Recall 3  Language- name 2 objects 2  Language- repeat 1  Language- follow 3 step command 3  Language- read & follow direction 1  Write a sentence 1  Copy design 1  Total score 30        09/18/2023   10:18 AM 06/17/2022   12:12 PM  6CIT Screen  What Year? 0 points 0 points  What month? 0 points 0 points  What time? 0 points 0 points  Count back from 20 0 points 0 points  Months in reverse 0 points 0 points  Repeat phrase 0 points 0 points  Total Score 0 points 0 points    Immunizations Immunization History  Administered Date(s) Administered   Influenza-Unspecified 05/02/2022, 06/05/2023   PFIZER(Purple Top)SARS-COV-2 Vaccination 09/22/2019, 10/15/2019, 06/12/2020, 07/08/2021   Tdap 12/17/2021  TDAP status: Up to date  Flu Vaccine status: Up to date  Pneumococcal vaccine status: Due, Education has been provided regarding the importance of this vaccine. Advised may receive this vaccine at local pharmacy or Health Dept. Aware to provide a copy of the vaccination record if obtained from local pharmacy or Health Dept. Verbalized acceptance and understanding.  Covid-19 vaccine status: Information provided on how to obtain vaccines.   Qualifies for Shingles Vaccine? Yes   Zostavax completed No   Shingrix Completed?: No.    Education has been provided regarding the importance of this vaccine. Patient has been advised to call insurance company to determine out of pocket expense if they have not yet received this vaccine. Advised may also receive vaccine at local pharmacy or Health Dept. Verbalized acceptance and understanding.  Screening Tests Health Maintenance  Topic Date Due   Zoster Vaccines- Shingrix (1 of 2) Never done   COVID-19 Vaccine (5 - 2024-25 season) 04/09/2023    Pneumonia Vaccine 22+ Years old (1 of 2 - PCV) 12/05/2023 (Originally 06/23/1957)   MAMMOGRAM  12/29/2023 (Originally 03/26/2014)   Medicare Annual Wellness (AWV)  09/17/2024   DTaP/Tdap/Td (2 - Td or Tdap) 12/18/2031   INFLUENZA VACCINE  Completed   DEXA SCAN  Completed   Hepatitis C Screening  Completed   HPV VACCINES  Aged Out    Health Maintenance  Health Maintenance Due  Topic Date Due   Zoster Vaccines- Shingrix (1 of 2) Never done   COVID-19 Vaccine (5 - 2024-25 season) 04/09/2023    Colorectal cancer screening: Type of screening: Cologuard. Completed 08/30/22. Repeat every 3 years  Mammogram status: Completed 03/26/12 declined at this time . Repeat every year  Bone Density status: Completed 12/22/22. Results reflect: Bone density results: OSTEOPOROSIS. Repeat every 2 years.   Additional Screening:  Hepatitis C Screening:  Completed 03/30/12  Vision Screening: Recommended annual ophthalmology exams for early detection of glaucoma and other disorders of the eye. Is the patient up to date with their annual eye exam?  Yes  Who is the provider or what is the name of the office in which the patient attends annual eye exams? Dr Nelva Bang  If pt is not established with a provider, would they like to be referred to a provider to establish care? No .   Dental Screening: Recommended annual dental exams for proper oral hygiene   Community Resource Referral / Chronic Care Management: CRR required this visit?  No   CCM required this visit?  No     Plan:     I have personally reviewed and noted the following in the patient's chart:   Medical and social history Use of alcohol, tobacco or illicit drugs  Current medications and supplements including opioid prescriptions. Patient is not currently taking opioid prescriptions. Functional ability and status Nutritional status Physical activity Advanced directives List of other physicians Hospitalizations, surgeries, and ER  visits in previous 12 months Vitals Screenings to include cognitive, depression, and falls Referrals and appointments  In addition, I have reviewed and discussed with patient certain preventive protocols, quality metrics, and best practice recommendations. A written personalized care plan for preventive services as well as general preventive health recommendations were provided to patient.     Bruno Capri, LPN   1/61/0960   After Visit Summary: (MyChart) Due to this being a telephonic visit, the after visit summary with patients personalized plan was offered to patient via MyChart   Nurse Notes: none

## 2023-09-18 NOTE — Patient Instructions (Signed)
 [name] , Thank you for taking time to come for your Medicare Wellness Visit. I appreciate your ongoing commitment to your health goals. Please review the following plan we discussed and let me know if I can assist you in the future.   Referrals/Orders/Follow-Ups/Clinician Recommendations: Aim for 30 minutes of exercise or brisk walking, 6-8 glasses of water, and 5 servings of fruits and vegetables each day. Continue to work on losing 15 lbs as discussed      Mammograms are recommended yearly  Information provided  [facility] [address] [place], [place] [address] [phone]  [facility] [address] [place], [place] [address] [phone]  [facility] [address] [place], [place] [address] [phone]  [facility] [address] [place], [place] [address] [phone]  [facility] [address] [place], [place] [address] [phone]  Schedule your [place] screening mammogram through MyChart!   Log into your MyChart account.  Go to 'Visit' (or 'Appointments' if on mobile App) --> Schedule an Appointment  Under 'Select a Reason for Visit' choose the Mammogram Screening option.  Complete the pre-visit questions and select the time and place that best fits your schedule.    This is a list of the screening recommended  Health Maintenance  Topic Date Due   Zoster (Shingles) Vaccine (1 of 2) Never done   COVID-19 Vaccine (5 - 2024-25 season) 04/09/2023   Pneumonia Vaccine (1 of 2 - PCV) 12/05/2023*   Mammogram  12/29/2023*   Medicare Annual Wellness Visit  09/17/2024   DTaP/Tdap/Td vaccine (2 - Td or Tdap) 12/18/2031   Flu Shot  Completed   DEXA scan (bone density measurement)  Completed   Hepatitis C Screening  Completed   HPV Vaccine  Aged Out  *Topic was postponed. The date shown is not the original due date.     Advanced directives: (Declined) Advance directive discussed with you today. Even though you declined this today, please call our office should you change your mind, and we can give you the proper paperwork for you to fill out.  Next Medicare Annual Wellness Visit scheduled for next year: Yes

## 2023-09-25 ENCOUNTER — Other Ambulatory Visit (HOSPITAL_COMMUNITY): Payer: Medicare HMO

## 2023-09-29 ENCOUNTER — Ambulatory Visit: Payer: Medicare HMO | Admitting: Cardiovascular Disease

## 2023-10-02 ENCOUNTER — Ambulatory Visit: Payer: Medicare HMO | Admitting: Cardiovascular Disease

## 2023-10-05 ENCOUNTER — Telehealth (HOSPITAL_COMMUNITY): Payer: Self-pay

## 2023-10-05 NOTE — Telephone Encounter (Signed)
 Spoke with patient to complete one month pre-procedure call.     New medical conditions?  No Recent hospitalizations or surgeries? No Started any new medications? Progesterone, Omega 3 Patient made aware to contact office to inform of any new medications started. Any changes in activities of daily living? No  Pre-procedure testing scheduled: CT on 10/11/23 and lab work on 10/06/23.  Confirmed patient is taking Xarelto and will continue taking medication before procedure or it may need to be rescheduled.  Confirmed patient is scheduled for Atrial Fibrillation Ablation on Wednesday, March 26 with Dr. York Pellant. Instructed patient to arrive at the Main Entrance A at [facility]: [address] [place], [place] [address] and check in at Admitting at 11:30 AM  Advised of plan to go home the same day and will only stay overnight if medically necessary. You MUST have a responsible adult to drive you home and MUST be with you the first 24 hours after you arrive home or your procedure could be cancelled.  All questions answered. Patient verbalized understanding to information provided and is agreeable to proceed with procedure.

## 2023-10-06 ENCOUNTER — Ambulatory Visit: Payer: Medicare HMO | Attending: Cardiovascular Disease | Admitting: Cardiovascular Disease

## 2023-10-06 ENCOUNTER — Encounter: Payer: Self-pay | Admitting: Cardiovascular Disease

## 2023-10-06 VITALS — BP 150/84 | HR 59 | Ht 67.0 in | Wt 157.4 lb

## 2023-10-06 DIAGNOSIS — I480 Paroxysmal atrial fibrillation: Secondary | ICD-10-CM

## 2023-10-06 DIAGNOSIS — I4729 Other ventricular tachycardia: Secondary | ICD-10-CM | POA: Diagnosis not present

## 2023-10-06 DIAGNOSIS — R002 Palpitations: Secondary | ICD-10-CM | POA: Diagnosis not present

## 2023-10-06 NOTE — Patient Instructions (Signed)
 Medication Instructions:  Your physician recommends that you continue on your current medications as directed. Please refer to the Current Medication list given to you today. *If you need a refill on your cardiac medications before your next appointment, please call your pharmacy*   Lab Work: CBC and BMET today If you have labs (blood work) drawn today and your tests are completely normal, you will receive your results only by: MyChart Message (if you have MyChart) OR A paper copy in the mail If you have any lab test that is abnormal or we need to change your treatment, we will call you to review the results.   Testing/Procedures: Cardiac CT - see instruction letter Your physician has requested that you have cardiac CT. Cardiac computed tomography (CT) is a painless test that uses an x-ray machine to take clear, detailed pictures of your heart. For further information please visit [email or web address] Please follow instruction sheet as given.  Atrial Fibrillation Ablation - see instruction letter Your physician has recommended that you have an ablation. Catheter ablation is a medical procedure used to treat some cardiac arrhythmias (irregular heartbeats). During catheter ablation, a long, thin, flexible tube is put into a blood vessel in your groin (upper thigh), or neck. This tube is called an ablation catheter. It is then guided to your heart through the blood vessel. Radio frequency waves destroy small areas of heart tissue where abnormal heartbeats may cause an arrhythmia to start. Please see the instruction sheet given to you today.   Follow-Up: At [facility], you and your health needs are our priority.  As part of our continuing mission to provide you with exceptional heart care, we have created designated Provider Care Teams.  These Care Teams include your primary Cardiologist (physician) and Advanced Practice Providers (APPs -  Physician Assistants and Nurse Practitioners)  who all work together to provide you with the care you need, when you need it.  We recommend signing up for the patient portal called "MyChart".  Sign up information is provided on this After Visit Summary.  MyChart is used to connect with patients for Virtual Visits (Telemedicine).  Patients are able to view lab/test results, encounter notes, upcoming appointments, etc.  Non-urgent messages can be sent to your provider as well.   To learn more about what you can do with MyChart, go to [email or web address].    Your next appointment:   We will schedule follow up after your ablation   Provider:   York Pellant, MD

## 2023-10-06 NOTE — Progress Notes (Signed)
 Electrophysiology Office Note:    Date:  10/06/2023   ID:  [name], Melinda [birth year], MRN [id number]  PCP:  Jarold Motto, PA   [facility] Providers Cardiologist:  None     Referring MD: Jarold Motto, PA   History of Present Illness:    Melinda [name] is a 73 y.o. female with a medical history significant for paroxysmal atrial fibrillation, sleep apnea, hypertension, hyperlipidemia, referred for atrial fibrillation management.     She was initially diagnosed with atrial fibrillation and flutter in June 2022 after presenting to her PCP with palpitations.  ECG showed rapid atrial flutter initially, but eventually atrial fibrillation was detected as well.  She has noticed increasing frequency and duration of palpitations, associated with fatigue.  Sometimes she is unable to work.  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     Today, she reports that she is feeling well. She has continued to have episodes of atrial fibrillation, most recently yesterday.  EKGs/Labs/Other Studies Reviewed Today:     Echocardiogram:  TTE March 02, 2021 Left ventricular ejection fraction 60%.  RV systolic function normal.  Atria are normal in size   Monitors:   Stress testing:   Advanced imaging:  Cardiac CT 11/26/2022 Coronary calcium score of 90.9, 73rd percentile for age and race and sex matched controls  Cardiac catherization   EKG:   EKG Interpretation Date/Time:  Friday October 06 2023 11:45:28 EST Ventricular Rate:  59 PR Interval:  132 QRS Duration:  82 QT Interval:  440 QTC Calculation: 435 R Axis:   66  Text Interpretation: Sinus bradycardia When compared with ECG of 04-May-2023 15:26, No significant change was found Confirmed by York Pellant [phone]) on 10/06/2023 11:52:43 AM     Physical Exam:    VS:  BP (!) 150/84 (BP Location: Left Arm, Patient Position: Sitting, Cuff Size: Normal)   Pulse (!)  59   Ht 5\' 7"  (1.702 m)   Wt 157 lb 6.4 oz (71.4 kg)   LMP 08/04/2009   SpO2 99%   BMI 24.65 kg/m     Wt Readings from Last 3 Encounters:  10/06/23 157 lb 6.4 oz (71.4 kg)  09/18/23 156 lb (70.8 kg)  06/22/23 156 lb 12.8 oz (71.1 kg)     GEN:  Well nourished, well developed in no acute distress CARDIAC: RRR, no murmurs, rubs, gallops RESPIRATORY:  Normal work of breathing MUSCULOSKELETAL: no edema    ASSESSMENT & PLAN:      Paroxysmal atrial fibrillation Symptomatic, rates not well-controlled I think rhythm control is indicated because she is symptomatic We discussed options.  I recommended ablation She would like to proceed with ablation Continue home monitoring with Apple Watch  We discussed the indication, rationale, logistics, anticipated benefits, and potential risks of the ablation procedure which include but are not limited to -- bleed at the groin access site, chest pain, damage to nearby organs such as the diaphragm, lungs, or esophagus, need for a drainage tube, or prolonged hospitalization. I explained that the risk for stroke, heart attack, need for open chest surgery, or even death is very low but not zero. she  expressed understanding and wishes to proceed.   Mild sleep apnea She has tried and manages this with with a weight loss approach  Secondary hypercoagulable state Continue Xarelto 20  Coronary artery disease Coronary score of 90 She is declining statin at this time   Signed, Maurice Small, MD  10/06/2023 12:16 PM    [facility]

## 2023-10-06 NOTE — H&P (View-Only) (Signed)
 Electrophysiology Office Note:    Date:  10/06/2023   ID:  Melinda, [name] [birth year], MRN [id number]  PCP:  Jarold Motto, PA   [facility] Providers Cardiologist:  None     Referring MD: Jarold Motto, PA   History of Present Illness:    Melinda [name] is a 73 y.o. female with a medical history significant for paroxysmal atrial fibrillation, sleep apnea, hypertension, hyperlipidemia, referred for atrial fibrillation management.     She was initially diagnosed with atrial fibrillation and flutter in June 2022 after presenting to her PCP with palpitations.  ECG showed rapid atrial flutter initially, but eventually atrial fibrillation was detected as well.  She has noticed increasing frequency and duration of palpitations, associated with fatigue.  Sometimes she is unable to work.  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     Today, she reports that she is feeling well. She has continued to have episodes of atrial fibrillation, most recently yesterday.  EKGs/Labs/Other Studies Reviewed Today:     Echocardiogram:  TTE March 02, 2021 Left ventricular ejection fraction 60%.  RV systolic function normal.  Atria are normal in size   Monitors:   Stress testing:   Advanced imaging:  Cardiac CT 11/26/2022 Coronary calcium score of 90.9, 73rd percentile for age and race and sex matched controls  Cardiac catherization   EKG:   EKG Interpretation Date/Time:  Friday October 06 2023 11:45:28 EST Ventricular Rate:  59 PR Interval:  132 QRS Duration:  82 QT Interval:  440 QTC Calculation: 435 R Axis:   66  Text Interpretation: Sinus bradycardia When compared with ECG of 04-May-2023 15:26, No significant change was found Confirmed by York Pellant [phone]) on 10/06/2023 11:52:43 AM     Physical Exam:    VS:  BP (!) 150/84 (BP Location: Left Arm, Patient Position: Sitting, Cuff Size: Normal)   Pulse (!)  59   Ht 5\' 7"  (1.702 m)   Wt 157 lb 6.4 oz (71.4 kg)   LMP 08/04/2009   SpO2 99%   BMI 24.65 kg/m     Wt Readings from Last 3 Encounters:  10/06/23 157 lb 6.4 oz (71.4 kg)  09/18/23 156 lb (70.8 kg)  06/22/23 156 lb 12.8 oz (71.1 kg)     GEN:  Well nourished, well developed in no acute distress CARDIAC: RRR, no murmurs, rubs, gallops RESPIRATORY:  Normal work of breathing MUSCULOSKELETAL: no edema    ASSESSMENT & PLAN:      Paroxysmal atrial fibrillation Symptomatic, rates not well-controlled I think rhythm control is indicated because she is symptomatic We discussed options.  I recommended ablation She would like to proceed with ablation Continue home monitoring with Apple Watch  We discussed the indication, rationale, logistics, anticipated benefits, and potential risks of the ablation procedure which include but are not limited to -- bleed at the groin access site, chest pain, damage to nearby organs such as the diaphragm, lungs, or esophagus, need for a drainage tube, or prolonged hospitalization. I explained that the risk for stroke, heart attack, need for open chest surgery, or even death is very low but not zero. she  expressed understanding and wishes to proceed.   Mild sleep apnea She has tried and manages this with with a weight loss approach  Secondary hypercoagulable state Continue Xarelto 20  Coronary artery disease Coronary score of 90 She is declining statin at this time   Signed, Maurice Small, MD  10/06/2023 12:16 PM    [facility]

## 2023-10-07 LAB — BASIC METABOLIC PANEL
BUN/Creatinine Ratio: 19 (ref 12–28)
BUN: 16 mg/dL (ref 8–27)
CO2: 25 mmol/L (ref 20–29)
Calcium: 9.8 mg/dL (ref 8.7–10.3)
Chloride: 104 mmol/L (ref 96–106)
Creatinine, Ser: 0.83 mg/dL (ref 0.57–1.00)
Glucose: 119 mg/dL — ABNORMAL HIGH (ref 70–99)
Potassium: 4.2 mmol/L (ref 3.5–5.2)
Sodium: 144 mmol/L (ref 134–144)
eGFR: 76 mL/min/{1.73_m2} (ref >59)

## 2023-10-07 LAB — CBC
Hematocrit: 40.2 % (ref 34.0–46.6)
Hemoglobin: 13.1 g/dL (ref 11.1–15.9)
MCH: 28.7 pg (ref 26.6–33.0)
MCHC: 32.6 g/dL (ref 31.5–35.7)
MCV: 88 fL (ref 79–97)
Platelets: 181 10*3/uL (ref 150–450)
RBC: 4.57 x10E6/uL (ref 3.77–5.28)
RDW: 12 % (ref 11.7–15.4)
WBC: 6.1 10*3/uL (ref 3.4–10.8)

## 2023-10-08 ENCOUNTER — Encounter: Payer: Self-pay | Admitting: Cardiovascular Disease

## 2023-10-11 ENCOUNTER — Ambulatory Visit (HOSPITAL_COMMUNITY)
Admission: RE | Admit: 2023-10-11 | Discharge: 2023-10-11 | Disposition: A | Discharge status: Home / Self Care | Payer: Medicare HMO | Source: Ambulatory Visit | Attending: Cardiovascular Disease | Admitting: Cardiovascular Disease

## 2023-10-11 DIAGNOSIS — I480 Paroxysmal atrial fibrillation: Secondary | ICD-10-CM | POA: Diagnosis present

## 2023-10-11 MED ORDER — IOHEXOL 350 MG/ML SOLN
95.0000 mL | Freq: Once | INTRAVENOUS | Status: AC | PRN
  Administered 2023-10-11: 95 mL via INTRAVENOUS

## 2023-10-15 ENCOUNTER — Encounter: Payer: Self-pay | Admitting: Cardiovascular Disease

## 2023-10-20 ENCOUNTER — Other Ambulatory Visit (HOSPITAL_COMMUNITY): Payer: Self-pay | Admitting: Physician Assistant

## 2023-10-25 ENCOUNTER — Telehealth (HOSPITAL_COMMUNITY): Payer: Self-pay

## 2023-10-25 LAB — HM MAMMOGRAPHY

## 2023-10-25 NOTE — Telephone Encounter (Signed)
 Call placed to patient to discuss upcoming procedure.   CT: completed.  Labs: completed.   Any recent signs of acute illness or been started on antibiotics? No Any new medications started? No Any medications to hold? No Any missed doses of blood thinner?  No Advised patient to continue taking ANTICOAGULANT: Xarelto (Rivaroxaban) without missing any doses.  Medication instructions:  On the morning of your procedure DO NOT take any medication., including Xarelto or the procedure may be rescheduled. Nothing to eat or drink after midnight prior to your procedure.  Confirmed patient is scheduled for Atrial Fibrillation Ablation on Wednesday, March 26 with Dr. York Pellant. Instructed patient to arrive at the Main Entrance A at [facility]: [address] [place], [place] [address] and check in at Admitting at 11:00 AM.  Advised of plan to go home the same day and will only stay overnight if medically necessary. You MUST have a responsible adult to drive you home and MUST be with you the first 24 hours after you arrive home or your procedure could be cancelled.  Patient verbalized understanding to all instructions provided and agreed to proceed with procedure.

## 2023-10-27 ENCOUNTER — Telehealth: Payer: Self-pay | Admitting: Cardiovascular Disease

## 2023-10-27 NOTE — Telephone Encounter (Signed)
 Spoke with pt who reports she received a steroid injection from Dr Berline Chough, Sports Medicine yesterday for knee pain.  Pt complaining of vomiting and feeling off balance this morning.  Pt states she has contacted Dr Janeece Riggers office who suggested electrolyte replacement as well as a bland diet.  Pt reports she has done this with improvement in her symptoms.   Pt advised will forward to Dr Nelly Laurence for his review.  Pt verbalizes understanding and thanked Charity fundraiser for the call.

## 2023-10-27 NOTE — Telephone Encounter (Signed)
 Pt called in stating she had a steroid injection due to fluid in her knee causing her pain. She states today her head felt heavy, having balance issues, and she threw up this morning. She states her throwing up made her feel better. She would like to know if she is still okay to have ablation next week. Please advise.

## 2023-10-30 ENCOUNTER — Ambulatory Visit (HOSPITAL_COMMUNITY): Payer: Medicare HMO | Admitting: Physician Assistant

## 2023-10-31 NOTE — Pre-Procedure Instructions (Signed)
 Attempted to call patient regarding procedure instructions.  Left voicemail on the following items: Arrival time 1000, new time Nothing to eat or drink after midnight No meds AM of procedure Responsible person to drive you home and stay with you for 24 hrs  Have you missed any doses of anti-coagulant Xarelto-  should be taken once a day, if you have missed any doses please let us know.  Don't take any medications morning of procedure.

## 2023-10-31 NOTE — Telephone Encounter (Signed)
 Spoke with patient, states she is feeling MUCH better and is looking forward to her ablation tomorrow. Answered pre-procedural questions. No further needs at this time

## 2023-11-01 ENCOUNTER — Other Ambulatory Visit: Payer: Self-pay

## 2023-11-01 ENCOUNTER — Ambulatory Visit (HOSPITAL_BASED_OUTPATIENT_CLINIC_OR_DEPARTMENT_OTHER)

## 2023-11-01 ENCOUNTER — Ambulatory Visit (HOSPITAL_COMMUNITY)
Admission: RE | Disposition: A | Discharge status: Home / Self Care | Payer: Self-pay | Source: Home / Self Care | Attending: Cardiovascular Disease

## 2023-11-01 ENCOUNTER — Ambulatory Visit (HOSPITAL_COMMUNITY)

## 2023-11-01 ENCOUNTER — Ambulatory Visit (HOSPITAL_COMMUNITY)
Admission: RE | Admit: 2023-11-01 | Discharge: 2023-11-01 | Disposition: A | Discharge status: Home / Self Care | Payer: Medicare HMO | Attending: Cardiovascular Disease | Admitting: Cardiovascular Disease

## 2023-11-01 DIAGNOSIS — G4730 Sleep apnea, unspecified: Secondary | ICD-10-CM | POA: Diagnosis not present

## 2023-11-01 DIAGNOSIS — D6869 Other thrombophilia: Secondary | ICD-10-CM | POA: Diagnosis not present

## 2023-11-01 DIAGNOSIS — I10 Essential (primary) hypertension: Secondary | ICD-10-CM | POA: Insufficient documentation

## 2023-11-01 DIAGNOSIS — Z7901 Long term (current) use of anticoagulants: Secondary | ICD-10-CM | POA: Insufficient documentation

## 2023-11-01 DIAGNOSIS — I480 Paroxysmal atrial fibrillation: Secondary | ICD-10-CM | POA: Diagnosis present

## 2023-11-01 DIAGNOSIS — I2510 Atherosclerotic heart disease of native coronary artery without angina pectoris: Secondary | ICD-10-CM | POA: Insufficient documentation

## 2023-11-01 HISTORY — PX: ATRIAL FIBRILLATION ABLATION: EP1191

## 2023-11-01 LAB — POCT ACTIVATED CLOTTING TIME: Activated Clotting Time: 297 s

## 2023-11-01 SURGERY — ATRIAL FIBRILLATION ABLATION
Anesthesia: General

## 2023-11-01 MED ORDER — DEXAMETHASONE SODIUM PHOSPHATE 10 MG/ML IJ SOLN
INTRAMUSCULAR | Status: DC | PRN
  Administered 2023-11-01: 10 mg via INTRAVENOUS

## 2023-11-01 MED ORDER — FENTANYL CITRATE (PF) 100 MCG/2ML IJ SOLN
INTRAMUSCULAR | Status: AC
  Filled 2023-11-01: qty 2

## 2023-11-01 MED ORDER — SODIUM CHLORIDE 0.9% FLUSH: 3.0000 mL | Freq: Two times a day (BID) | INTRAVENOUS | Status: DC

## 2023-11-01 MED ORDER — PROPOFOL 1000 MG/100ML IV EMUL
INTRAVENOUS | Status: AC
  Filled 2023-11-01: qty 100

## 2023-11-01 MED ORDER — ONDANSETRON HCL 4 MG/2ML IJ SOLN
INTRAMUSCULAR | Status: DC | PRN
  Administered 2023-11-01: 4 mg via INTRAVENOUS

## 2023-11-01 MED ORDER — SCOPOLAMINE 1 MG/3DAYS TD PT72
MEDICATED_PATCH | TRANSDERMAL | Status: DC | PRN
  Administered 2023-11-01: 1 mg via TRANSDERMAL

## 2023-11-01 MED ORDER — FENTANYL CITRATE (PF) 250 MCG/5ML IJ SOLN
INTRAMUSCULAR | Status: DC | PRN
  Administered 2023-11-01 (×2): 25 ug via INTRAVENOUS
  Administered 2023-11-01: 50 ug via INTRAVENOUS

## 2023-11-01 MED ORDER — SCOPOLAMINE 1 MG/3DAYS TD PT72
MEDICATED_PATCH | TRANSDERMAL | Status: AC
  Filled 2023-11-01: qty 1

## 2023-11-01 MED ORDER — PHENYLEPHRINE HCL-NACL 20-0.9 MG/250ML-% IV SOLN
INTRAVENOUS | Status: DC | PRN
Start: 2023-11-01 — End: 2023-11-01
  Administered 2023-11-01: 20 ug/min via INTRAVENOUS

## 2023-11-01 MED ORDER — PROPOFOL 10 MG/ML IV BOLUS
INTRAVENOUS | Status: DC | PRN
  Administered 2023-11-01 (×2): 40 mg via INTRAVENOUS
  Administered 2023-11-01 (×2): 50 mg via INTRAVENOUS
  Administered 2023-11-01: 30 mg via INTRAVENOUS
  Administered 2023-11-01: 90 mg via INTRAVENOUS
  Administered 2023-11-01: 20 mg via INTRAVENOUS

## 2023-11-01 MED ORDER — ONDANSETRON HCL 4 MG/2ML IJ SOLN: 4.0000 mg | Freq: Four times a day (QID) | INTRAMUSCULAR | Status: DC | PRN

## 2023-11-01 MED ORDER — PROPOFOL 500 MG/50ML IV EMUL
INTRAVENOUS | Status: DC | PRN
  Administered 2023-11-01: 70 ug/kg/min via INTRAVENOUS

## 2023-11-01 MED ORDER — HEPARIN (PORCINE) IN NACL 1000-0.9 UT/500ML-% IV SOLN
INTRAVENOUS | Status: DC | PRN
  Administered 2023-11-01 (×3): 500 mL

## 2023-11-01 MED ORDER — HEPARIN SODIUM (PORCINE) 1000 UNIT/ML IJ SOLN
INTRAMUSCULAR | Status: DC | PRN
  Administered 2023-11-01: 12000 [IU] via INTRAVENOUS
  Administered 2023-11-01: 1000 [IU] via INTRAVENOUS

## 2023-11-01 MED ORDER — ATROPINE SULFATE 1 MG/10ML IJ SOSY
PREFILLED_SYRINGE | INTRAMUSCULAR | Status: DC | PRN
Start: 2023-11-01 — End: 2023-11-01
  Administered 2023-11-01: 1 mg via INTRAVENOUS

## 2023-11-01 MED ORDER — SODIUM CHLORIDE 0.9% FLUSH: 3.0000 mL | INTRAVENOUS | Status: DC | PRN

## 2023-11-01 MED ORDER — PROTAMINE SULFATE 10 MG/ML IV SOLN
INTRAVENOUS | Status: DC | PRN
  Administered 2023-11-01: 50 mg via INTRAVENOUS

## 2023-11-01 MED ORDER — SODIUM CHLORIDE 0.9 % IV SOLN: 250.0000 mL | INTRAVENOUS | Status: DC | PRN

## 2023-11-01 MED ORDER — SODIUM CHLORIDE 0.9 % IV SOLN: INTRAVENOUS | Status: DC

## 2023-11-01 MED ORDER — ROCURONIUM BROMIDE 10 MG/ML (PF) SYRINGE
PREFILLED_SYRINGE | INTRAVENOUS | Status: DC | PRN
  Administered 2023-11-01: 60 mg via INTRAVENOUS
  Administered 2023-11-01: 20 mg via INTRAVENOUS

## 2023-11-01 MED ORDER — LIDOCAINE 2% (20 MG/ML) 5 ML SYRINGE
INTRAMUSCULAR | Status: DC | PRN
  Administered 2023-11-01: 60 mg via INTRAVENOUS

## 2023-11-01 MED ORDER — PHENYLEPHRINE 80 MCG/ML (10ML) SYRINGE FOR IV PUSH (FOR BLOOD PRESSURE SUPPORT)
PREFILLED_SYRINGE | INTRAVENOUS | Status: DC | PRN
  Administered 2023-11-01 (×3): 80 ug via INTRAVENOUS

## 2023-11-01 MED ORDER — ACETAMINOPHEN 325 MG PO TABS: 650.0000 mg | ORAL_TABLET | ORAL | Status: DC | PRN

## 2023-11-01 MED ORDER — SUGAMMADEX SODIUM 200 MG/2ML IV SOLN
INTRAVENOUS | Status: DC | PRN
  Administered 2023-11-01: 200 mg via INTRAVENOUS

## 2023-11-01 SURGICAL SUPPLY — 20 items
BAG SNAP BAND KOVER 36X36 (MISCELLANEOUS) IMPLANT
CABLE PFA RX CATH CONN (CABLE) IMPLANT
CATH FARAWAVE ABLATION 31 (CATHETERS) IMPLANT
CATH OCTARAY 2.0 F 3-3-3-3-3 (CATHETERS) IMPLANT
CATH SOUNDSTAR ECO 8FR (CATHETERS) IMPLANT
CATH WEBSTER BI DIR CS D-F CRV (CATHETERS) IMPLANT
CLOSURE PERCLOSE PROSTYLE (VASCULAR PRODUCTS) IMPLANT
COVER SWIFTLINK CONNECTOR (BAG) ×1 IMPLANT
DEVICE CLOSURE MYNXGRIP 6/7F (Vascular Products) IMPLANT
DILATOR VESSEL 38 20CM 16FR (INTRODUCER) IMPLANT
GUIDEWIRE INQWIRE 1.5J.035X260 (WIRE) IMPLANT
INQWIRE 1.5J .035X260CM (WIRE) ×1 IMPLANT
KIT VERSACROSS CNCT FARADRIVE (KITS) IMPLANT
PACK EP LF (CUSTOM PROCEDURE TRAY) ×1 IMPLANT
PAD DEFIB RADIO PHYSIO CONN (PAD) ×1 IMPLANT
PATCH CARTO3 (PAD) IMPLANT
SHEATH FARADRIVE STEERABLE (SHEATH) IMPLANT
SHEATH PINNACLE 8F 10CM (SHEATH) IMPLANT
SHEATH PINNACLE 9F 10CM (SHEATH) IMPLANT
SHEATH PROBE COVER 6X72 (BAG) IMPLANT

## 2023-11-01 NOTE — Discharge Instructions (Signed)

## 2023-11-01 NOTE — Anesthesia Procedure Notes (Signed)
 Procedure Name: Intubation Date/Time: 11/01/2023 12:22 PM  Performed by: Vena Austria, CRNAPre-anesthesia Checklist: Patient identified, Emergency Drugs available, Suction available, Patient being monitored and Timeout performed Patient Re-evaluated:Patient Re-evaluated prior to induction Oxygen Delivery Method: Circle system utilized Preoxygenation: Pre-oxygenation with 100% oxygen Induction Type: IV induction Ventilation: Mask ventilation without difficulty Laryngoscope Size: McGrath and 3 Grade View: Grade I Tube type: Oral Tube size: 6.5 mm Number of attempts: 1 Airway Equipment and Method: Stylet and Video-laryngoscopy Placement Confirmation: ETT inserted through vocal cords under direct vision, positive ETCO2, CO2 detector and breath sounds checked- equal and bilateral Secured at: 21 cm Tube secured with: Tape Dental Injury: Teeth and Oropharynx as per pre-operative assessment

## 2023-11-01 NOTE — Progress Notes (Signed)
 Patient walked to the bathroom without difficulties. Bilateral groins level 0, clean, dry, and intact.

## 2023-11-01 NOTE — Anesthesia Preprocedure Evaluation (Signed)
 Anesthesia Evaluation  Patient identified by MRN, date of birth, ID band Patient awake    Reviewed: Allergy & Precautions, NPO status , Patient's Chart, lab work & pertinent test results  History of Anesthesia Complications Negative for: history of anesthetic complications  Airway Mallampati: II  TM Distance: >3 FB Neck ROM: Full    Dental  (+) Teeth Intact, Dental Advisory Given   Pulmonary neg shortness of breath, neg sleep apnea, neg COPD, neg recent URI   breath sounds clear to auscultation       Cardiovascular hypertension, + dysrhythmias Atrial Fibrillation  Rhythm:Regular     Neuro/Psych neg Seizures  Neuromuscular disease  negative psych ROS   GI/Hepatic negative GI ROS, Neg liver ROS,,,  Endo/Other  negative endocrine ROS    Renal/GU negative Renal ROSLab Results      Component                Value               Date                      NA                       144                 10/06/2023                K                        4.2                 10/06/2023                CO2                      25                  10/06/2023                GLUCOSE                  119 (H)             10/06/2023                BUN                      16                  10/06/2023                CREATININE               0.83                10/06/2023                CALCIUM                  9.8                 10/06/2023                GFR                      72.93  12/29/2022                EGFR                     76                  10/06/2023                GFRNONAA                 >60                 02/04/2021                Musculoskeletal   Abdominal   Peds  Hematology Lab Results      Component                Value               Date                      WBC                      6.1                 10/06/2023                HGB                      13.1                10/06/2023                 HCT                      40.2                10/06/2023                MCV                      88                  10/06/2023                PLT                      181                 10/06/2023              Anesthesia Other Findings   Reproductive/Obstetrics                             Anesthesia Physical Anesthesia Plan  ASA: 2  Anesthesia Plan: General   Post-op Pain Management: Minimal or no pain anticipated   Induction: Intravenous  PONV Risk Score and Plan: 3 and Ondansetron, Dexamethasone, Propofol infusion, TIVA, Midazolam and Scopolamine patch - Pre-op  Airway Management Planned: Oral ETT  Additional Equipment: None  Intra-op Plan:   Post-operative Plan: Extubation in OR  Informed Consent: I have reviewed the patients History and Physical, chart, labs and discussed the procedure including the risks, benefits and alternatives for the proposed anesthesia with the patient or authorized representative who has indicated his/her understanding and  acceptance.     Dental advisory given  Plan Discussed with: CRNA  Anesthesia Plan Comments:        Anesthesia Quick Evaluation

## 2023-11-01 NOTE — Interval H&P Note (Signed)
 History and Physical Interval Note:  11/01/2023 11:31 AM  Melinda [name]  has presented today for surgery, with the diagnosis of a fib.  The various methods of treatment have been discussed with the patient and family. After consideration of risks, benefits and other options for treatment, the patient has consented to  Procedure(s): ATRIAL FIBRILLATION ABLATION (N/A) as a surgical intervention.  The patient's history has been reviewed, patient examined, no change in status, stable for surgery.  I have reviewed the patient's chart and labs.  Questions were answered to the patient's satisfaction.    I reviewed the patient's CT and labs. There was no LAA thrombus. she  has not missed any doses of anticoagulation, and she took her dose last night. There have been no changes in the patient's diagnoses, medications, or condition since our recent clinic visit.    Roberts Gaudy Yordan Martindale

## 2023-11-01 NOTE — Transfer of Care (Signed)
 Immediate Anesthesia Transfer of Care Note  Patient: Melinda [name]  Procedure(s) Performed: ATRIAL FIBRILLATION ABLATION  Patient Location: PACU and Cath Lab  Anesthesia Type:General  Level of Consciousness: awake and alert   Airway & Oxygen Therapy: Patient Spontanous Breathing and Patient connected to nasal cannula oxygen  Post-op Assessment: Report given to RN and Post -op Vital signs reviewed and stable  Post vital signs: stable  Last Vitals:  Vitals Value Taken Time  BP 117/62 11/01/23 1400  Temp    Pulse 81 11/01/23 1401  Resp 19 11/01/23 1401  SpO2 100 % 11/01/23 1401  Vitals shown include unfiled device data.  Last Pain:  Vitals:   11/01/23 1023  TempSrc: Oral         Complications: No notable events documented.

## 2023-11-02 ENCOUNTER — Telehealth (HOSPITAL_COMMUNITY): Payer: Self-pay

## 2023-11-02 ENCOUNTER — Encounter (HOSPITAL_COMMUNITY): Payer: Self-pay | Admitting: Cardiovascular Disease

## 2023-11-02 MED FILL — Fentanyl Citrate Preservative Free (PF) Inj 100 MCG/2ML: INTRAMUSCULAR | Qty: 2 | Status: AC

## 2023-11-02 MED FILL — Propofol IV Emul 1000 MG/100ML (10 MG/ML): INTRAVENOUS | Qty: 50 | Status: AC

## 2023-11-02 NOTE — Anesthesia Postprocedure Evaluation (Signed)
 Anesthesia Post Note  Patient: Melinda [name]  Procedure(s) Performed: ATRIAL FIBRILLATION ABLATION     Patient location during evaluation: Cath Lab Anesthesia Type: General Level of consciousness: awake and alert Pain management: pain level controlled Vital Signs Assessment: post-procedure vital signs reviewed and stable Respiratory status: spontaneous breathing, nonlabored ventilation and respiratory function stable Cardiovascular status: blood pressure returned to baseline and stable Postop Assessment: no apparent nausea or vomiting Anesthetic complications: no   No notable events documented.                 Kamika Goodloe

## 2023-11-02 NOTE — Telephone Encounter (Signed)
 Spoke with patient to complete post procedure follow up call.  Patient reports no complications with groin sites.   Instructions reviewed with patient:  Remove large bandage at puncture site after 24 hours. It is normal to have bruising, tenderness and a pea or marble sized lump/knot at the groin site which can take up to three months to resolve.  Get help right away if you notice sudden swelling at the puncture site.  Check your puncture site every day for signs of infection: fever, redness, swelling, pus drainage, warmth, foul odor or excessive pain. If this occurs, please call the office at [phone], to speak with the nurse. Get help right away if your puncture site is bleeding and the bleeding does not stop after applying firm pressure to the area.  You may continue to have skipped beats/ atrial fibrillation during the first several months after your procedure.  It is very important not to miss any doses of your blood thinner Xarelto. Patient restarted taking this medication on yesterday, 11/01/23.   You will follow up with the Afib clinic on 11/27/23 and follow up with the APP on 02/02/24.   Patient verbalized understanding to all instructions provided.

## 2023-11-03 ENCOUNTER — Ambulatory Visit: Payer: Medicare HMO | Admitting: *Deleted

## 2023-11-03 VITALS — BP 126/73 | HR 63 | Temp 98.1°F | Resp 16 | Ht 66.0 in | Wt 157.8 lb

## 2023-11-03 DIAGNOSIS — M810 Age-related osteoporosis without current pathological fracture: Secondary | ICD-10-CM

## 2023-11-03 MED ORDER — DENOSUMAB 60 MG/ML ~~LOC~~ SOSY
60.0000 mg | PREFILLED_SYRINGE | Freq: Once | SUBCUTANEOUS | Status: AC
  Administered 2023-11-03: 60 mg via SUBCUTANEOUS

## 2023-11-03 NOTE — Progress Notes (Signed)
 Diagnosis: Osteoporosis  Provider:  Chilton Greathouse MD  Procedure: Injection  Prolia (Denosumab), Dose: 60 mg, Site: subcutaneous, Number of injections: 1  Injection Site(s): Left upper quad. gluteus  Post Care: Observation period completed  Discharge: Condition: Good, Destination: Home . AVS Provided  Performed by:  Forrest Moron, RN

## 2023-11-06 ENCOUNTER — Telehealth: Payer: Self-pay | Admitting: Cardiovascular Disease

## 2023-11-06 NOTE — Telephone Encounter (Signed)
 STAT if HR is under 50 or over 120  (normal HR is 60-100 beats per minute)  What is your heart rate? 92  Do you have a log of your heart rate readings (document readings)? N/A  Do you have any other symptoms? Patient stated she felt awful yesterday.  Patient stated she had an ablation last Wednesday. Since then, her watch alerted her that her respiratory rate and HR has been higher while she is sleeping at night compared to the average rate. Patient is concerned about the increased in HR since having the procedure. Please advise.

## 2023-11-06 NOTE — Telephone Encounter (Signed)
 Spoke with patient and she states her HR has been elevated since ablation. She states even while asleep her HR is elevated. She felt fine after ablation but yesterday she felt tired all day.    Informed patient that it is normal to go back into afib after abaltion. She states she did EKG with watch and it did not indicate AFIB. She states she would like to know if she should be taking it easy or she able to do her daily activities. Being that her heart rate has been elevated she states she does not want to do to much. Even at rest and asleep its been in the 90's. She has not missed any doses of her xarelto

## 2023-11-07 NOTE — Telephone Encounter (Addendum)
 Spoke with patient and  he is scheduled for NV on Thursday. She can call and cancel if symptoms does not resolve

## 2023-11-09 ENCOUNTER — Ambulatory Visit

## 2023-11-13 ENCOUNTER — Telehealth: Payer: Self-pay | Admitting: *Deleted

## 2023-11-13 ENCOUNTER — Telehealth: Payer: Self-pay | Admitting: Cardiovascular Disease

## 2023-11-13 NOTE — Telephone Encounter (Signed)
   Pre-operative Risk Assessment    Patient Name: Melinda [name]  DOB: 1950/09/12 MRN: [id number]   Date of last office visit: 10/06/23 DR. MEALOR Date of next office visit: 02/02/24 Otilio Saber, [facility]   Request for Surgical Clearance    Procedure:   LEFT BREAST STEREOTACTIC Bx  Date of Surgery:  Clearance 11/23/23                                Surgeon:  NOT LISTED Surgeon's Group or Practice Name:  [facility] [place] Phone number:  [phone] Fax number:  [phone]   Type of Clearance Requested:   - Pharmacy:  Hold Rivaroxaban (Xarelto) x 3 DAYS PRIOR AND RESUME 1 DAYS POST PROCEDURE. (PER CLEARANCE FORM: MAMMOGRAPHY ANTICOAGULATION DISCONTINUATION REQUEST)   Type of Anesthesia:  Not Indicated   Additional requests/questions:    Elpidio Anis   11/13/2023, 8:46 AM

## 2023-11-13 NOTE — Telephone Encounter (Signed)
 The patient attempted to cx the appointment by responding to text message. That does not work.  The appointment was cancelled.

## 2023-11-13 NOTE — Telephone Encounter (Signed)
 Pt called in about no show on 11/09/23. She selected the option "no" when reminder text came through, however appt was not cancelled. Please advise if the status of that appt can be changed to just cancelled.

## 2023-11-13 NOTE — Telephone Encounter (Signed)
 Please advise holding Xarelto prior to left breast stereotactic biopsy on 4/17  Thank you!  DW

## 2023-11-14 ENCOUNTER — Telehealth: Payer: Self-pay | Admitting: *Deleted

## 2023-11-14 NOTE — Telephone Encounter (Signed)
 Pt has been scheduled tele preop appt 11/16/23. Med rec and consent are done.

## 2023-11-14 NOTE — Telephone Encounter (Signed)
 Patient with diagnosis of afib on Xarelto for anticoagulation.    Procedure:  LEFT BREAST STEREOTACTIC Bx  Date of procedure: 11/23/23   CHA2DS2-VASc Score = 4   This indicates a 4.8% annual risk of stroke. The patient's score is based upon: CHF History: 0 HTN History: 1 Diabetes History: 0 Stroke History: 0 Vascular Disease History: 1 (calcification on CT) Age Score: 1 Gender Score: 1      CrCl 61 ml/min Platelet count 181  Per office protocol, patient can hold Xarelto for 3 days prior to procedure.    **This guidance is not considered finalized until pre-operative APP has relayed final recommendations.**

## 2023-11-14 NOTE — Telephone Encounter (Signed)
 Pt has been scheduled tele preop appt 11/16/23. Med rec and consent are done.      Patient Consent for Virtual Visit        Melinda [name] has provided verbal consent on 11/14/2023 for a virtual visit (video or telephone).   CONSENT FOR VIRTUAL VISIT FOR:  Melinda [name]  By participating in this virtual visit I agree to the following:  I hereby voluntarily request, consent and authorize [facility] and its employed or contracted physicians, physician assistants, nurse practitioners or other licensed health care professionals (the Practitioner), to provide me with telemedicine health care services (the "Services") as deemed necessary by the treating Practitioner. I acknowledge and consent to receive the Services by the Practitioner via telemedicine. I understand that the telemedicine visit will involve communicating with the Practitioner through live audiovisual communication technology and the disclosure of certain medical information by electronic transmission. I acknowledge that I have been given the opportunity to request an in-person assessment or other available alternative prior to the telemedicine visit and am voluntarily participating in the telemedicine visit.  I understand that I have the right to withhold or withdraw my consent to the use of telemedicine in the course of my care at any time, without affecting my right to future care or treatment, and that the Practitioner or I may terminate the telemedicine visit at any time. I understand that I have the right to inspect all information obtained and/or recorded in the course of the telemedicine visit and may receive copies of available information for a reasonable fee.  I understand that some of the potential risks of receiving the Services via telemedicine include:  Delay or interruption in medical evaluation due to technological equipment failure or disruption; Information transmitted may not be sufficient (e.g. poor  resolution of images) to allow for appropriate medical decision making by the Practitioner; and/or  In rare instances, security protocols could fail, causing a breach of personal health information.  Furthermore, I acknowledge that it is my responsibility to provide information about my medical history, conditions and care that is complete and accurate to the best of my ability. I acknowledge that Practitioner's advice, recommendations, and/or decision may be based on factors not within their control, such as incomplete or inaccurate data provided by me or distortions of diagnostic images or specimens that may result from electronic transmissions. I understand that the practice of medicine is not an exact science and that Practitioner makes no warranties or guarantees regarding treatment outcomes. I acknowledge that a copy of this consent can be made available to me via my patient portal [facility] MyChart), or I can request a printed copy by calling the office of [facility].    I understand that my insurance will be billed for this visit.   I have read or had this consent read to me. I understand the contents of this consent, which adequately explains the benefits and risks of the Services being provided via telemedicine.  I have been provided ample opportunity to ask questions regarding this consent and the Services and have had my questions answered to my satisfaction. I give my informed consent for the services to be provided through the use of telemedicine in my medical care

## 2023-11-14 NOTE — Telephone Encounter (Signed)
   Name: Melinda [name]  DOB: Apr 02, 1951  MRN: [id number]  Primary Cardiologist: Armanda Magic, MD   Preoperative team, please contact this patient and set up a phone call appointment for further preoperative risk assessment. Please obtain consent and complete medication review. Thank you for your help.   Needs VV due to recent ablation on 11/01/2023.   I confirm that guidance regarding antiplatelet and oral anticoagulation therapy has been completed and, if necessary, noted below.  Per office protocol, patient can hold Xarelto for 3 days prior to procedure.    I also confirmed the patient resides in the state of [place]. As per [facility] Medical Board telemedicine laws, the patient must reside in the state in which the provider is licensed.   Denyce Robert, NP 11/14/2023, 9:29 AM [facility]

## 2023-11-15 NOTE — Telephone Encounter (Signed)
 Spoke with patient, expressed gratitude for the offer from Dr Nelly Laurence but he already has tickets. Patient laughed stated she felt like he would enjoy that.   No needs

## 2023-11-16 ENCOUNTER — Ambulatory Visit: Attending: Cardiovascular Disease

## 2023-11-16 DIAGNOSIS — Z01810 Encounter for preprocedural cardiovascular examination: Secondary | ICD-10-CM

## 2023-11-16 NOTE — Progress Notes (Signed)
 Virtual Visit via Telephone Note   Because of [name] co-morbid illnesses, she is at least at moderate risk for complications without adequate follow up.  This format is felt to be most appropriate for this patient at this time.  Due to technical limitations with video connection (technology), today's appointment will be conducted as an audio only telehealth visit, and Melinda [name] verbally agreed to proceed in this manner.   All issues noted in this document were discussed and addressed.  No physical exam could be performed with this format.  Evaluation Performed:  Preoperative cardiovascular risk assessment _____________   Date:  11/16/2023   Patient ID:  Melinda, [name] August 16, 1950, MRN [id number] Patient Location:  Home Provider location:   Office  Primary Care Provider:  Jarold Motto, [place] Primary Cardiologist:  Armanda Magic, MD  Chief Complaint / Patient Profile   73 y.o. y/o female with a h/o paroxysmal AF, HLD, HTN, CAD, arthritis, OSA who is pending left breast biopsy and presents today for telephonic preoperative cardiovascular risk assessment.  History of Present Illness    Melinda [name] is a 73 y.o. female who presents via audio/video conferencing for a telehealth visit today.  Pt was last seen in cardiology clinic on 03/17/2023 by Dr. Mayford Knife and was last seen by Dr. Nelly Laurence on 10/06/2023 but still having episodes of AF.  She underwent AF ablation on 11/01/2023 and contacted office 11/06/2023 with elevated heart rate. She is scheduled to follow-up with AF clinic 11/27/2023 and EP on 02/02/2024.   The patient is now pending procedure as outlined above. Since her last visit, she reports doing well with no new cardiac complaints.  She has been doing normal ADLs as well as working outdoors without any difficulties.  She also reports no episodes of tachycardia or atrial fibrillation since her ablation.  She denies chest pain, shortness of breath, lower extremity edema,  fatigue, palpitations, melena, hematuria, hemoptysis, diaphoresis, weakness, presyncope, syncope, orthopnea, and PND.   Past Medical History    Past Medical History:  Diagnosis Date   Agatston coronary artery calcium score less than 100    Coronary calcium score 90 in April 2024   Arthritis    Back problem    Cataracts, bilateral    Chicken pox    Disorder of adrenal gland (HCC)    Disorders of sulfur-bearing amino-acid metabolism, unspecified (HCC)    Displaced fracture of base of unspecified metacarpal bone, initial encounter for closed fracture    Gallstones    Hyperlipidemia    Hypertension    Insomnia    Iron deficiency anemia, unspecified    mostly vegetarian   Measles    Mumps    Sciatica    Sensitivity to medication    Sprain of unspecified ligament of right ankle, subsequent encounter    Vitamin D deficiency, unspecified    Past Surgical History:  Procedure Laterality Date   ATRIAL FIBRILLATION ABLATION N/A 11/01/2023   Procedure: ATRIAL FIBRILLATION ABLATION;  Surgeon: Maurice Small, MD;  Location: [facility] INV[facility]IVE CV [facility];  Service: Cardiovascular;  Laterality: N/A;    Allergies  Allergies  Allergen Reactions   Pseudoephedrine Other (See Comments)    Hyper active    Estradiol Dermatitis    Home Medications    Prior to Admission medications   Medication Sig Start Date End Date Taking? Authorizing Provider  Acetylcysteine (NAC) 600 MG CAPS Take 600 mg by mouth at bedtime.    [provider]  Ascorbic Acid (VITAMIN C PO) Take 1 tablet by mouth daily in the afternoon. Vitamin C and Bio-Quercetin Phytosome    [provider]  Cholecalciferol (VITAMIN D-3) 125 MCG (5000 UT) TABS Take 10,000 Units by mouth See admin instructions. Monday -Friday at lunch    [provider]  denosumab (PROLIA) 60 MG/ML SOSY injection Inject 60 mg into the skin every 6 (six) months.    [provider]  DIHYDROBERBERINE PO Take 100 mg by mouth  daily. 15 min before meal    [provider]  diltiazem (CARDIZEM CD) 120 MG 24 hr capsule Take 1 capsule by mouth once daily 10/20/23   Fenton, Clint R, PA  diltiazem (CARDIZEM) 30 MG tablet Take 30 mg by mouth every 4 (four) hours as needed (HR >90).    [provider]  fish oil-omega-3 fatty acids 1000 MG capsule Take 2 g by mouth daily.    [provider]  GLYCINE PO Take 2,000 mg by mouth See admin instructions. Pure glycine powder 1000 mg per scoop at bedtime on an empty stomach or open and melt .    [provider]  L-Tryptophan 500 MG CAPS Take 500 mg by mouth at bedtime.    [provider]  MAGNESIUM GLUCONATE PO Take 120 mg by mouth at bedtime.    [provider]  melatonin 3 MG TABS tablet Take 6 mg by mouth at bedtime as needed (Sleep). Patient not taking: Reported on 11/14/2023    [provider]  metoprolol tartrate (LOPRESSOR) 25 MG tablet Take 1 tablet (25 mg total) by mouth every 6 (six) hours as needed (breakthrough afib). 07/14/23   Fenton, Clint R, PA  Multiple Vitamins-Minerals (MULTIVITAMIN WITH MINERALS) tablet Take 2 tablets by mouth every evening. Garden of life Vitamin code 66 and Wiser Woman    [provider]  Multiple Vitamins-Minerals (MULTIVITAMIN WOMEN PO) Take 1 tablet by mouth daily in the afternoon. Vitamin Code 50 and wiser    [provider]  NON FORMULARY Take 50 mg by mouth daily. P 5 P Patient not taking: Reported on 11/14/2023    [provider]  OVER THE COUNTER MEDICATION Take 2 g by mouth daily. Chlorella (2 tab)1 g Patient not taking: Reported on 11/14/2023    [provider]  OVER THE COUNTER MEDICATION Take 1 capsule by mouth daily in the afternoon. Unique E- Patient not taking: Reported on 11/14/2023    [provider]  OVER THE COUNTER MEDICATION Take 1 tablet by mouth daily in the afternoon. MacuGuard Ocular Support    [provider]  OVER  THE COUNTER MEDICATION Take 1 tablet by mouth daily in the afternoon. LV-GB Complex    [provider]  OVER THE COUNTER MEDICATION Take 1 capsule by mouth at bedtime. Magtein    [provider]  progesterone (PROMETRIUM) 100 MG capsule Take 100 mg by mouth at bedtime. 08/23/23   [provider]  QUERCETIN PO Take 475 mg of amoxicillin by mouth daily in the afternoon. Patient not taking: Reported on 11/14/2023    [provider]  TAURINE PO Take 2,000 mg by mouth at bedtime. Power (1 scoop)    [provider]  triamcinolone cream (KENALOG) 0.1 % Apply 1 Application topically daily as needed (Eczema). 10/05/23   [provider]  UNABLE TO FIND Take 1 tablet by mouth every evening. Betaplus    [provider]  VITAMIN K PO Take 1 tablet by  mouth daily. Koncentrated K    [provider]  XARELTO 20 MG TABS tablet TAKE 1 TABLET BY MOUTH ONCE DAILY WITH SUPPER 10/20/23   Danice Goltz, [place]    Physical Exam    Vital Signs:  Melinda [name] does not have vital signs available for review today  Given telephonic nature of communication, physical exam is limited. AAOx3. NAD. Normal affect.  Speech and respirations are unlabored.  Accessory Clinical Findings    None  Assessment & Plan    1.  Preoperative Cardiovascular Risk Assessment:  -Patient's RCRI score is 0.9%  The patient affirms she has been doing well without any new cardiac symptoms. They are able to achieve 7 METS without cardiac limitations. Therefore, based on ACC/AHA guidelines, the patient would be at acceptable risk for the planned procedure without further cardiovascular testing. The patient was advised that if she develops new symptoms prior to surgery to contact our office to arrange for a follow-up visit, and she verbalized understanding.   The patient was advised that if she develops new symptoms prior to surgery to contact our office to arrange for a  follow-up visit, and she verbalized understanding.   A copy of this note will be routed to requesting surgeon.  Time:   Today, I have spent 6 minutes with the patient with telehealth technology discussing medical history, symptoms, and management plan.     Napoleon Form, Leodis Rains, NP  11/16/2023, 7:30 AM

## 2023-11-17 ENCOUNTER — Other Ambulatory Visit (HOSPITAL_COMMUNITY): Payer: Self-pay | Admitting: Physician Assistant

## 2023-11-19 ENCOUNTER — Other Ambulatory Visit (HOSPITAL_COMMUNITY): Payer: Self-pay | Admitting: Physician Assistant

## 2023-11-20 NOTE — Telephone Encounter (Signed)
 Prescription refill request for Xarelto received.  Indication:afib Last office visit:2/25 Weight:71.6  kg Age:73 Scr:0.83  2/25 CrCl:71.29  ml/min  Prescription refilled

## 2023-11-27 ENCOUNTER — Encounter (HOSPITAL_COMMUNITY): Payer: Self-pay | Admitting: Physician Assistant

## 2023-11-27 ENCOUNTER — Ambulatory Visit (HOSPITAL_COMMUNITY)
Admission: RE | Admit: 2023-11-27 | Discharge: 2023-11-27 | Disposition: A | Discharge status: Home / Self Care | Source: Ambulatory Visit | Attending: Physician Assistant | Admitting: Physician Assistant

## 2023-11-27 VITALS — BP 124/60 | HR 71 | Ht 66.0 in | Wt 154.8 lb

## 2023-11-27 DIAGNOSIS — D6869 Other thrombophilia: Secondary | ICD-10-CM | POA: Diagnosis not present

## 2023-11-27 DIAGNOSIS — I2510 Atherosclerotic heart disease of native coronary artery without angina pectoris: Secondary | ICD-10-CM | POA: Insufficient documentation

## 2023-11-27 DIAGNOSIS — I10 Essential (primary) hypertension: Secondary | ICD-10-CM | POA: Diagnosis not present

## 2023-11-27 DIAGNOSIS — Z7901 Long term (current) use of anticoagulants: Secondary | ICD-10-CM | POA: Diagnosis not present

## 2023-11-27 DIAGNOSIS — G4733 Obstructive sleep apnea (adult) (pediatric): Secondary | ICD-10-CM | POA: Insufficient documentation

## 2023-11-27 DIAGNOSIS — I480 Paroxysmal atrial fibrillation: Secondary | ICD-10-CM | POA: Diagnosis not present

## 2023-11-27 DIAGNOSIS — E785 Hyperlipidemia, unspecified: Secondary | ICD-10-CM | POA: Diagnosis not present

## 2023-11-27 DIAGNOSIS — I4892 Unspecified atrial flutter: Secondary | ICD-10-CM | POA: Diagnosis not present

## 2023-11-27 DIAGNOSIS — I4891 Unspecified atrial fibrillation: Secondary | ICD-10-CM | POA: Diagnosis present

## 2023-11-27 NOTE — Progress Notes (Signed)
 Primary Care Physician: Alexander Iba, [place] Primary Cardiologist: Dr Micael Adas Primary Electrophysiologist: Dr Arlester Ladd  Referring Physician: [facility]   Melinda [name] is a 73 y.o. female with a history of HLD, OSA, CAD, HTN, atrial fibrillation who presents for follow up in the [facility].  The patient was initially diagnosed with atrial fibrillation and atrial flutter on 02/04/21 when she presented to her PCP with palpitations. ECG showed rapid atrial flutter and she was sent to the ED. She was started on rate control and then her ECG showed afib. Patient was started on Xarelto  for stroke prevention. She was seen by Dr Arlester Ladd and underwent afib ablation on 11/01/23.  Patient returns for follow up for atrial fibrillation. She reports that she has done well from a cardiac standpoint. She has not had any interim afib symptoms. She denies chest pain or groin issues. She has had issues with vertigo and also had a breast biopsy on 4/17.   Today, she  denies symptoms of palpitations, chest pain, shortness of breath, orthopnea, PND, lower extremity edema, dizziness, presyncope, syncope, snoring, daytime somnolence, bleeding, or neurologic sequela. The patient is tolerating medications without difficulties and is otherwise without complaint today.    Atrial Fibrillation Risk Factors:  she does have symptoms or diagnosis of sleep apnea. she does not have a history of rheumatic fever. she does not have a history of alcohol use. The patient does not have a history of early familial atrial fibrillation or other arrhythmias.   Atrial Fibrillation Management history:  Previous antiarrhythmic drugs: none Previous cardioversions: none Previous ablations: 11/01/23 Anticoagulation history: Xarelto    Past Medical History:  Diagnosis Date   Agatston coronary artery calcium score less than 100    Coronary calcium score 90 in April 2024   Arthritis    Back problem     Cataracts, bilateral    Chicken pox    Disorder of adrenal gland (HCC)    Disorders of sulfur-bearing amino-acid metabolism, unspecified (HCC)    Displaced fracture of base of unspecified metacarpal bone, initial encounter for closed fracture    Gallstones    Hyperlipidemia    Hypertension    Insomnia    Iron deficiency anemia, unspecified    mostly vegetarian   Measles    Mumps    Sciatica    Sensitivity to medication    Sprain of unspecified ligament of right ankle, subsequent encounter    Vitamin D  deficiency, unspecified    ROS- All systems are reviewed and negative except as per the HPI above.  Physical Exam: There were no vitals filed for this visit.  GEN: Well nourished, well developed in no acute distress CARDIAC: Regular rate and rhythm, no murmurs, rubs, gallops RESPIRATORY:  Clear to auscultation without rales, wheezing or rhonchi  ABDOMEN: Soft, non-tender, non-distended EXTREMITIES:  No edema; No deformity    Wt Readings from Last 3 Encounters:  11/03/23 71.6 kg  11/01/23 68.9 kg  10/06/23 71.4 kg    EKG today demonstrates  SR Vent. rate 71 BPM PR interval 140 ms QRS duration 82 ms QT/QTcB 400/434 ms   Echo 03/02/21  1. Left ventricular ejection fraction by 3D volume is 62 %. The left  ventricle has normal function. The left ventricle has no regional wall  motion abnormalities. Left ventricular diastolic parameters were normal.   2. Right ventricular systolic function is normal. The right ventricular  size is normal. There is normal pulmonary artery systolic pressure.  3. The mitral valve is normal in structure. Mild mitral valve  regurgitation. No evidence of mitral stenosis.   4. The aortic valve is grossly normal. Aortic valve regurgitation is  trivial. No aortic stenosis is present.   Epic records are reviewed at length today  CHA2DS2-VASc Score = 4  The patient's score is based upon: CHF History: 0 HTN History: 1 Diabetes History:  0 Stroke History: 0 Vascular Disease History: 1 (calcification on CT) Age Score: 1 Gender Score: 1       ASSESSMENT AND PLAN: Paroxysmal Atrial Fibrillation/atrial flutter The patient's CHA2DS2-VASc score is 4, indicating a 4.8% annual risk of stroke.   S/p afib ablation 11/01/23 Patient appears to be maintaining SR Continue diltiazem  120 mg daily with 30 mg PRN q 4 hours for heart racing Continue Xarelto  20 mg daily with no missed doses for 3 months post ablation. Would not recommend holding dose during this time unless absolutely necessary.  Apple Watch for home monitoring.   Secondary Hypercoagulable State (ICD10:  D68.69) The patient is at significant risk for stroke/thromboembolism based upon her CHA2DS2-VASc Score of 4.  Continue Rivaroxaban  (Xarelto ). No bleeding issues.   HTN Stable on current regimen  OSA  Patient has declined CPAP and oral device  CAD No anginal symptoms Followed by Dr Micael Adas   Follow up with Michaelle Adolphus as scheduled.    Myrtha Ates PA-C [facility] [address] [place], [place] [address] [phone] 11/27/2023 9:38 AM

## 2023-11-29 ENCOUNTER — Ambulatory Visit (HOSPITAL_COMMUNITY): Admitting: Physician Assistant

## 2023-12-19 ENCOUNTER — Other Ambulatory Visit: Payer: Self-pay | Admitting: Sports Medicine

## 2023-12-19 ENCOUNTER — Ambulatory Visit
Admission: RE | Admit: 2023-12-19 | Discharge: 2023-12-19 | Disposition: A | Discharge status: Home / Self Care | Source: Ambulatory Visit | Attending: Sports Medicine | Admitting: Sports Medicine

## 2023-12-19 DIAGNOSIS — M25561 Pain in right knee: Secondary | ICD-10-CM

## 2023-12-19 DIAGNOSIS — R531 Weakness: Secondary | ICD-10-CM

## 2023-12-20 ENCOUNTER — Encounter: Payer: Self-pay | Admitting: Physician Assistant

## 2024-01-04 ENCOUNTER — Other Ambulatory Visit: Payer: Self-pay | Admitting: General Surgery

## 2024-01-04 DIAGNOSIS — R921 Mammographic calcification found on diagnostic imaging of breast: Secondary | ICD-10-CM

## 2024-01-08 ENCOUNTER — Other Ambulatory Visit: Payer: Self-pay | Admitting: General Surgery

## 2024-01-08 DIAGNOSIS — R921 Mammographic calcification found on diagnostic imaging of breast: Secondary | ICD-10-CM

## 2024-01-22 ENCOUNTER — Other Ambulatory Visit: Payer: Self-pay

## 2024-01-22 ENCOUNTER — Encounter (HOSPITAL_BASED_OUTPATIENT_CLINIC_OR_DEPARTMENT_OTHER): Payer: Self-pay | Admitting: General Surgery

## 2024-01-23 ENCOUNTER — Encounter: Payer: Self-pay | Admitting: Cardiovascular Disease

## 2024-01-23 ENCOUNTER — Ambulatory Visit: Attending: Cardiovascular Disease | Admitting: Cardiovascular Disease

## 2024-01-23 VITALS — BP 160/78 | HR 80 | Ht 66.0 in | Wt 154.0 lb

## 2024-01-23 DIAGNOSIS — R002 Palpitations: Secondary | ICD-10-CM

## 2024-01-23 DIAGNOSIS — I4729 Other ventricular tachycardia: Secondary | ICD-10-CM | POA: Diagnosis not present

## 2024-01-23 DIAGNOSIS — I480 Paroxysmal atrial fibrillation: Secondary | ICD-10-CM

## 2024-01-23 NOTE — Patient Instructions (Signed)
 Medication Instructions:  Your physician recommends that you continue on your current medications as directed. Please refer to the Current Medication list given to you today. *If you need a refill on your cardiac medications before your next appointment, please call your pharmacy*  Follow-Up: At [facility], you and your health needs are our priority.  As part of our continuing mission to provide you with exceptional heart care, our providers are all part of one team.  This team includes your primary Cardiologist (physician) and Advanced Practice Providers or APPs (Physician Assistants and Nurse Practitioners) who all work together to provide you with the care you need, when you need it.  Your next appointment:   6 month(s)  Provider:   Marlane Silver, MD

## 2024-01-23 NOTE — Progress Notes (Signed)
 Electrophysiology Office Note:    Date:  01/23/2024   ID:  Melinda [name], Melinda [name] 09/08/1950, MRN [id number]  PCP:  Alexander Iba, PA   [facility] Providers Cardiologist:  Gaylyn Keas, MD Electrophysiologist:  Efraim Grange, MD     Referring MD: Alexander Iba, [place]   History of Present Illness:    Melinda [name] is a 73 y.o. female with a medical history significant for paroxysmal atrial fibrillation, sleep apnea, hypertension, hyperlipidemia, referred for atrial fibrillation management.     She was initially diagnosed with atrial fibrillation and flutter in June 2022 after presenting to her PCP with palpitations.  ECG showed rapid atrial flutter initially, but eventually atrial fibrillation was detected as well.  She has noticed increasing frequency and duration of palpitations, associated with fatigue.  Sometimes she is unable to work.  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  She underwent ablation for atrial fibrillation on November 01, 2023.  The pulmonary veins and posterior wall were ablated by pulsed field ablation.  There were no immediate complications.     Today, she reports that she is feeling well.  She has noticed a few very brief episodes of elevated heart rate.  Her watch has not detected atrial fibrillation.  She has not been able to obtain an EKG during episodes because they are so brief.  EKGs/Labs/Other Studies Reviewed Today:     Echocardiogram:  TTE March 02, 2021 Left ventricular ejection fraction 60%.  RV systolic function normal.  Atria are normal in size   Monitors:   Stress testing:   Advanced imaging:  Cardiac CT 11/26/2022 Coronary calcium score of 90.9, 73rd percentile for age and race and sex matched controls  Cardiac catherization   EKG:   EKG Interpretation Date/Time:  Tuesday January 23 2024 11:34:16 EDT Ventricular Rate:  72 PR Interval:  144 QRS Duration:  74 QT  Interval:  398 QTC Calculation: 435 R Axis:   67  Text Interpretation: Normal sinus rhythm Normal ECG When compared with ECG of 27-Nov-2023 09:38, No significant change was found Confirmed by Marlane Silver [phone]) on 01/23/2024 11:52:20 AM     Physical Exam:    VS:  BP (!) 160/78 (BP Location: Left Arm, Patient Position: Sitting, Cuff Size: Normal)   Pulse 80   Ht 5' 6 (1.676 m)   Wt 154 lb (69.9 kg)   LMP 08/04/2009   SpO2 97%   BMI 24.86 kg/m     Wt Readings from Last 3 Encounters:  01/23/24 154 lb (69.9 kg)  11/27/23 154 lb 12.8 oz (70.2 kg)  11/03/23 157 lb 12.8 oz (71.6 kg)     GEN:  Well nourished, well developed in no acute distress CARDIAC: RRR, no murmurs, rubs, gallops RESPIRATORY:  Normal work of breathing MUSCULOSKELETAL: no edema    ASSESSMENT & PLAN:      Paroxysmal atrial fibrillation Symptomatic, rates not well-controlled Status post ablation for atrial fibrillation November 01, 2023 Continue home monitoring with Apple Watch -- no episodes detected She has today about discontinuing Xarelto ; I advised her to continue it for now   Mild sleep apnea She has tried and manages this with with a weight loss approach Has declined CPAP and oral device  Secondary hypercoagulable state CHA2DS2-VASc score is 4 Continue Xarelto  20  Coronary artery disease Coronary score of 90 She declined statin in the past  Signed, Efraim Grange, MD  01/23/2024 12:09 PM  [facility]

## 2024-01-26 ENCOUNTER — Ambulatory Visit
Admission: RE | Admit: 2024-01-26 | Discharge: 2024-01-26 | Disposition: A | Discharge status: Home / Self Care | Source: Ambulatory Visit | Attending: General Surgery | Admitting: General Surgery

## 2024-01-26 DIAGNOSIS — R921 Mammographic calcification found on diagnostic imaging of breast: Secondary | ICD-10-CM

## 2024-01-26 HISTORY — PX: BREAST BIOPSY: SHX20

## 2024-01-26 NOTE — Progress Notes (Signed)
 Ensure presurgery (by 0415 DOS) and CHG soap given to pt with written and verbal instruction. Pt verbalized understanding.        Enhanced Recovery after Surgery for Orthopedics Enhanced Recovery after Surgery is a protocol used to improve the stress on your body and your recovery after surgery.  Patient Instructions  The night before surgery:  No food after midnight. ONLY clear liquids after midnight  The day of surgery (if you do NOT have diabetes):  Drink ONE (1) Pre-Surgery Clear Ensure as directed.   This drink was given to you during your hospital  pre-op appointment visit. The pre-op nurse will instruct you on the time to drink the  Pre-Surgery Ensure depending on your surgery time. Finish the drink at the designated time by the pre-op nurse.  Nothing else to drink after completing the  Pre-Surgery Clear Ensure.  The day of surgery (if you have diabetes): Drink ONE (1) Gatorade 2 (G2) as directed. This drink was given to you during your hospital  pre-op appointment visit.  The pre-op nurse will instruct you on the time to drink the   Gatorade 2 (G2) depending on your surgery time. Color of the Gatorade may vary. Red is not allowed. Nothing else to drink after completing the  Gatorade 2 (G2).         If you have questions, please contact your surgeon's office.

## 2024-01-28 NOTE — Anesthesia Preprocedure Evaluation (Signed)
 Anesthesia Evaluation  Patient identified by MRN, date of birth, ID band Patient awake    Reviewed: Allergy & Precautions, NPO status , Patient's Chart, lab work & pertinent test results  History of Anesthesia Complications Negative for: history of anesthetic complications  Airway Mallampati: II  TM Distance: >3 FB Neck ROM: Full    Dental no notable dental hx. (+) Dental Advisory Given, Teeth Intact   Pulmonary neg shortness of breath, neg sleep apnea, neg COPD, neg recent URI   Pulmonary exam normal breath sounds clear to auscultation       Cardiovascular hypertension, Pt. on home beta blockers and Pt. on medications Normal cardiovascular exam+ dysrhythmias Atrial Fibrillation  Rhythm:Regular Rate:Normal     Neuro/Psych neg Seizures  Neuromuscular disease  negative psych ROS   GI/Hepatic negative GI ROS, Neg liver ROS,,,  Endo/Other  negative endocrine ROS    Renal/GU negative Renal ROSLab Results      Component                Value               Date                      NA                       144                 10/06/2023                K                        4.2                 10/06/2023                CO2                      25                  10/06/2023                GLUCOSE                  119 (H)             10/06/2023                BUN                      16                  10/06/2023                CREATININE               0.83                10/06/2023                CALCIUM                  9.8                 10/06/2023                GFR  72.93               12/29/2022                EGFR                     76                  10/06/2023                GFRNONAA                 >60                 02/04/2021                Musculoskeletal  (+) Arthritis ,    Abdominal   Peds  Hematology  (+) Blood dyscrasia, anemia Lab Results      Component                Value                Date                      WBC                      6.1                 10/06/2023                HGB                      13.1                10/06/2023                HCT                      40.2                10/06/2023                MCV                      88                  10/06/2023                PLT                      181                 10/06/2023              Anesthesia Other Findings   Reproductive/Obstetrics                              Anesthesia Physical Anesthesia Plan  ASA: 2  Anesthesia Plan: General   Post-op Pain Management: Tylenol  PO (pre-op)*   Induction: Intravenous  PONV Risk Score and Plan: 3 and Ondansetron , Dexamethasone , Propofol  infusion, TIVA, Midazolam and Scopolamine  patch - Pre-op  Airway Management Planned: LMA  Additional Equipment: None  Intra-op Plan:   Post-operative Plan: Extubation in OR  Informed Consent: I have reviewed the patients History and Physical, chart, labs and discussed the  procedure including the risks, benefits and alternatives for the proposed anesthesia with the patient or authorized representative who has indicated his/her understanding and acceptance.     Dental advisory given  Plan Discussed with: CRNA  Anesthesia Plan Comments:         Anesthesia Quick Evaluation

## 2024-01-29 ENCOUNTER — Encounter (HOSPITAL_BASED_OUTPATIENT_CLINIC_OR_DEPARTMENT_OTHER): Payer: Self-pay | Admitting: General Surgery

## 2024-01-29 ENCOUNTER — Ambulatory Visit (HOSPITAL_BASED_OUTPATIENT_CLINIC_OR_DEPARTMENT_OTHER)
Admission: RE | Admit: 2024-01-29 | Discharge: 2024-01-29 | Disposition: A | Discharge status: Home / Self Care | Attending: General Surgery | Admitting: General Surgery

## 2024-01-29 ENCOUNTER — Ambulatory Visit (HOSPITAL_BASED_OUTPATIENT_CLINIC_OR_DEPARTMENT_OTHER): Payer: Self-pay | Admitting: Anesthesiology

## 2024-01-29 ENCOUNTER — Ambulatory Visit
Admission: RE | Admit: 2024-01-29 | Discharge: 2024-01-29 | Discharge status: Home / Self Care | Source: Ambulatory Visit | Attending: General Surgery | Admitting: General Surgery

## 2024-01-29 ENCOUNTER — Other Ambulatory Visit: Payer: Self-pay

## 2024-01-29 ENCOUNTER — Encounter (HOSPITAL_BASED_OUTPATIENT_CLINIC_OR_DEPARTMENT_OTHER)
Admission: RE | Disposition: A | Discharge status: Home / Self Care | Payer: Self-pay | Source: Home / Self Care | Attending: General Surgery

## 2024-01-29 DIAGNOSIS — D1801 Hemangioma of skin and subcutaneous tissue: Secondary | ICD-10-CM | POA: Insufficient documentation

## 2024-01-29 DIAGNOSIS — R921 Mammographic calcification found on diagnostic imaging of breast: Secondary | ICD-10-CM | POA: Diagnosis present

## 2024-01-29 DIAGNOSIS — Z79899 Other long term (current) drug therapy: Secondary | ICD-10-CM | POA: Insufficient documentation

## 2024-01-29 DIAGNOSIS — I4891 Unspecified atrial fibrillation: Secondary | ICD-10-CM | POA: Diagnosis not present

## 2024-01-29 DIAGNOSIS — Z7901 Long term (current) use of anticoagulants: Secondary | ICD-10-CM | POA: Diagnosis not present

## 2024-01-29 DIAGNOSIS — I10 Essential (primary) hypertension: Secondary | ICD-10-CM | POA: Insufficient documentation

## 2024-01-29 DIAGNOSIS — Z01818 Encounter for other preprocedural examination: Secondary | ICD-10-CM

## 2024-01-29 HISTORY — DX: Family history of other specified conditions: Z84.89

## 2024-01-29 HISTORY — PX: EXCISION OF BREAST BIOPSY: SHX5822

## 2024-01-29 HISTORY — DX: Sleep apnea, unspecified: G47.30

## 2024-01-29 SURGERY — RADIOACTIVE SEED GUIDED BREAST BIOPSY
Anesthesia: General | Site: Breast | Laterality: Left

## 2024-01-29 MED ORDER — ENSURE PRE-SURGERY PO LIQD: 296.0000 mL | Freq: Once | ORAL | Status: DC

## 2024-01-29 MED ORDER — HYDROMORPHONE HCL 1 MG/ML IJ SOLN: 0.2500 mg | INTRAMUSCULAR | Status: DC | PRN

## 2024-01-29 MED ORDER — ONDANSETRON HCL 4 MG/2ML IJ SOLN
INTRAMUSCULAR | Status: DC | PRN
  Administered 2024-01-29: 4 mg via INTRAVENOUS

## 2024-01-29 MED ORDER — PROPOFOL 1000 MG/100ML IV EMUL
INTRAVENOUS | Status: AC
  Filled 2024-01-29: qty 400

## 2024-01-29 MED ORDER — DROPERIDOL 2.5 MG/ML IJ SOLN: 0.6250 mg | Freq: Once | INTRAMUSCULAR | Status: DC | PRN

## 2024-01-29 MED ORDER — CEFAZOLIN SODIUM-DEXTROSE 2-4 GM/100ML-% IV SOLN
2.0000 g | INTRAVENOUS | Status: AC
  Administered 2024-01-29: 2 g via INTRAVENOUS

## 2024-01-29 MED ORDER — BUPIVACAINE HCL (PF) 0.25 % IJ SOLN
INTRAMUSCULAR | Status: DC | PRN
Start: 2024-01-29 — End: 2024-01-29
  Administered 2024-01-29: 10 mL

## 2024-01-29 MED ORDER — CHLORHEXIDINE GLUCONATE CLOTH 2 % EX PADS: 6.0000 | MEDICATED_PAD | Freq: Once | CUTANEOUS | Status: DC

## 2024-01-29 MED ORDER — PROPOFOL 10 MG/ML IV BOLUS
INTRAVENOUS | Status: DC | PRN
  Administered 2024-01-29: 120 mg via INTRAVENOUS

## 2024-01-29 MED ORDER — LACTATED RINGERS IV SOLN: INTRAVENOUS | Status: DC

## 2024-01-29 MED ORDER — OXYCODONE HCL 5 MG PO TABS
ORAL_TABLET | ORAL | Status: AC
  Filled 2024-01-29: qty 1

## 2024-01-29 MED ORDER — CEFAZOLIN SODIUM-DEXTROSE 2-4 GM/100ML-% IV SOLN
INTRAVENOUS | Status: AC
  Filled 2024-01-29: qty 100

## 2024-01-29 MED ORDER — DEXAMETHASONE SODIUM PHOSPHATE 4 MG/ML IJ SOLN
INTRAMUSCULAR | Status: DC | PRN
  Administered 2024-01-29: 5 mg via INTRAVENOUS

## 2024-01-29 MED ORDER — PHENYLEPHRINE HCL (PRESSORS) 10 MG/ML IV SOLN
INTRAVENOUS | Status: DC | PRN
Start: 2024-01-29 — End: 2024-01-29
  Administered 2024-01-29 (×5): 80 ug via INTRAVENOUS

## 2024-01-29 MED ORDER — FENTANYL CITRATE (PF) 100 MCG/2ML IJ SOLN
INTRAMUSCULAR | Status: AC
Start: 2024-01-29 — End: 2024-01-29
  Filled 2024-01-29: qty 2

## 2024-01-29 MED ORDER — PROPOFOL 500 MG/50ML IV EMUL
INTRAVENOUS | Status: DC | PRN
  Administered 2024-01-29: 150 ug/kg/min via INTRAVENOUS

## 2024-01-29 MED ORDER — LIDOCAINE HCL (CARDIAC) PF 100 MG/5ML IV SOSY
PREFILLED_SYRINGE | INTRAVENOUS | Status: DC | PRN
  Administered 2024-01-29: 60 mg via INTRAVENOUS

## 2024-01-29 MED ORDER — ACETAMINOPHEN 500 MG PO TABS
1000.0000 mg | ORAL_TABLET | ORAL | Status: AC
  Administered 2024-01-29: 1000 mg via ORAL

## 2024-01-29 MED ORDER — SCOPOLAMINE 1 MG/3DAYS TD PT72
MEDICATED_PATCH | TRANSDERMAL | Status: AC
  Filled 2024-01-29: qty 1

## 2024-01-29 MED ORDER — OXYCODONE HCL 5 MG PO TABS
5.0000 mg | ORAL_TABLET | Freq: Once | ORAL | Status: AC | PRN
  Administered 2024-01-29: 5 mg via ORAL

## 2024-01-29 MED ORDER — MIDAZOLAM HCL 2 MG/2ML IJ SOLN
INTRAMUSCULAR | Status: AC
  Filled 2024-01-29: qty 2

## 2024-01-29 MED ORDER — ACETAMINOPHEN 500 MG PO TABS
ORAL_TABLET | ORAL | Status: AC
  Filled 2024-01-29: qty 2

## 2024-01-29 MED ORDER — SCOPOLAMINE 1 MG/3DAYS TD PT72
1.0000 | MEDICATED_PATCH | TRANSDERMAL | Status: DC
  Administered 2024-01-29: 1.5 mg via TRANSDERMAL

## 2024-01-29 MED ORDER — FENTANYL CITRATE (PF) 100 MCG/2ML IJ SOLN
INTRAMUSCULAR | Status: DC | PRN
  Administered 2024-01-29: 50 ug via INTRAVENOUS

## 2024-01-29 MED ORDER — EPHEDRINE SULFATE (PRESSORS) 50 MG/ML IJ SOLN
INTRAMUSCULAR | Status: DC | PRN
  Administered 2024-01-29: 5 mg via INTRAVENOUS

## 2024-01-29 MED ORDER — MIDAZOLAM HCL 2 MG/2ML IJ SOLN
INTRAMUSCULAR | Status: DC | PRN
  Administered 2024-01-29: 1 mg via INTRAVENOUS

## 2024-01-29 SURGICAL SUPPLY — 42 items
BINDER BREAST LRG (GAUZE/BANDAGES/DRESSINGS) ×1 IMPLANT
BINDER BREAST MEDIUM (GAUZE/BANDAGES/DRESSINGS) IMPLANT
BINDER BREAST XLRG (GAUZE/BANDAGES/DRESSINGS) IMPLANT
BINDER BREAST XXLRG (GAUZE/BANDAGES/DRESSINGS) IMPLANT
BLADE SURG 15 STRL LF DISP TIS (BLADE) ×2 IMPLANT
CANISTER SUCT 1200ML W/VALVE (MISCELLANEOUS) IMPLANT
CHLORAPREP W/TINT 26 (MISCELLANEOUS) ×2 IMPLANT
CLIP TI WIDE RED SMALL 6 (CLIP) IMPLANT
COVER BACK TABLE 60X90IN (DRAPES) ×2 IMPLANT
COVER MAYO STAND STRL (DRAPES) ×2 IMPLANT
DERMABOND ADVANCED .7 DNX12 (GAUZE/BANDAGES/DRESSINGS) ×1 IMPLANT
DRAPE LAPAROSCOPIC ABDOMINAL (DRAPES) ×2 IMPLANT
DRSG TEGADERM 4X4.75 (GAUZE/BANDAGES/DRESSINGS) ×1 IMPLANT
ELECT COATED BLADE 2.86 ST (ELECTRODE) ×2 IMPLANT
ELECTRODE REM PT RTRN 9FT ADLT (ELECTROSURGICAL) ×2 IMPLANT
GAUZE SPONGE 4X4 12PLY STRL LF (GAUZE/BANDAGES/DRESSINGS) ×1 IMPLANT
GLOVE BIO SURGEON STRL SZ7 (GLOVE) ×2 IMPLANT
GLOVE BIOGEL PI IND STRL 7.5 (GLOVE) ×2 IMPLANT
GOWN STRL REUS W/ TWL LRG LVL3 (GOWN DISPOSABLE) ×5 IMPLANT
KIT MARKER MARGIN INK (KITS) ×1 IMPLANT
NDL HYPO 25X1 1.5 SAFETY (NEEDLE) ×1 IMPLANT
NEEDLE HYPO 25X1 1.5 SAFETY (NEEDLE) ×2 IMPLANT
NS IRRIG 1000ML POUR BTL (IV SOLUTION) ×1 IMPLANT
PACK BASIN DAY SURGERY FS (CUSTOM PROCEDURE TRAY) ×2 IMPLANT
PENCIL SMOKE EVACUATOR (MISCELLANEOUS) ×2 IMPLANT
RETRACTOR ONETRAX LX 90X20 (MISCELLANEOUS) IMPLANT
SLEEVE SCD COMPRESS KNEE MED (STOCKING) ×2 IMPLANT
SPIKE FLUID TRANSFER (MISCELLANEOUS) IMPLANT
SPONGE T-LAP 4X18 ~~LOC~~+RFID (SPONGE) ×2 IMPLANT
STRIP CLOSURE SKIN 1/2X4 (GAUZE/BANDAGES/DRESSINGS) ×1 IMPLANT
SUT MNCRL AB 4-0 PS2 18 (SUTURE) IMPLANT
SUT MON AB 5-0 PS2 18 (SUTURE) ×1 IMPLANT
SUT SILK 2 0 SH (SUTURE) ×2 IMPLANT
SUT VIC AB 2-0 SH 27XBRD (SUTURE) ×2 IMPLANT
SUT VIC AB 3-0 SH 27X BRD (SUTURE) ×2 IMPLANT
SUT VIC AB 5-0 PS2 18 (SUTURE) IMPLANT
SUT VICRYL AB 3 0 TIES (SUTURE) IMPLANT
SYR CONTROL 10ML LL (SYRINGE) ×2 IMPLANT
TOWEL GREEN STERILE FF (TOWEL DISPOSABLE) ×2 IMPLANT
TRAY FAXITRON CT DISP (TRAY / TRAY PROCEDURE) ×1 IMPLANT
TUBE CONNECTING 20X1/4 (TUBING) IMPLANT
YANKAUER SUCT BULB TIP NO VENT (SUCTIONS) IMPLANT

## 2024-01-29 NOTE — Anesthesia Postprocedure Evaluation (Signed)
 Anesthesia Post Note  Patient: Melinda [name]  Procedure(s) Performed: EXCISION OF BREAST BIOPSY (Left: Breast)     Patient location during evaluation: PACU Anesthesia Type: General Level of consciousness: sedated and patient cooperative Pain management: pain level controlled Vital Signs Assessment: post-procedure vital signs reviewed and stable Respiratory status: spontaneous breathing Cardiovascular status: stable Anesthetic complications: no   No notable events documented.  Last Vitals:  Vitals:   01/29/24 0915 01/29/24 0932  BP: 121/66 132/68  Pulse: (!) 53 64  Resp: 13 16  Temp:  36.6 C  SpO2: 98% 99%    Last Pain:  Vitals:   01/29/24 0941  TempSrc:   PainSc: 4                  Norleen Pope

## 2024-01-29 NOTE — H&P (Signed)
 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 She is overall healthy. She has undergone her first mammogram which she did so she can get on hormone pellets. This mammogram showed a small area that measured 7 mm in biggest diameter in the left breast that go to about 1 cm proximal to the nipple base. I just have the reports from an outside facility. The pathology was benign but this was read as discordant as there were no calcifications. This was thought to be an unsuccessful biopsy due to the close proximity to the nipple and the thinness of the breast. She was then referred for evaluation to discuss excision.  Review of Systems: A complete review of systems was obtained from the patient. I have reviewed this information and discussed as appropriate with the patient. See HPI as well for other ROS.  Review of Systems  Neurological: Positive for dizziness (rare vertigo).  Endo/Heme/Allergies: Bruises/bleeds easily.   Medical History: Past Medical History:  Diagnosis Date  Arrhythmia  Arthritis  Hyperlipidemia  Hypertension    Past Surgical History:  Procedure Laterality Date  Atrial fibrillation ablation  LENS EYE SURGERY   Allergies  Allergen Reactions  Pseudoephedrine Other (See Comments)  Hallucination Hyper active  Estradiol  Dermatitis   Current Outpatient Medications on File Prior to Visit  Medication Sig Dispense Refill  denosumab  (PROLIA  SUBQ) Inject subcutaneously  dilTIAZem  (CARDIZEM ) 30 MG immediate release tablet TAKE 1 TABLET BY MOUTH EVERY 4 HOURS AS NEEDED FOR HEART RATE >100  progesterone (PROMETRIUM) 100 MG capsule TAKE 1 TO 3 CAPSULES BY MOUTH AT BEDTIME ONCE DAILY  XARELTO  20 mg tablet Take 1 tablet by mouth daily with dinner    Family History  Problem Relation Age of Onset  High blood pressure (Hypertension) Mother  Hyperlipidemia (Elevated cholesterol) Mother   Breast cancer Maternal Aunt    Social History   Tobacco Use  Smoking Status Never  Smokeless Tobacco Never  Marital status: Married  Tobacco Use  Smoking status: Never  Smokeless tobacco: Never  Substance and Sexual Activity  Alcohol use: Never  Drug use: Never   Objective:   Vitals:  01/04/24 1515 01/04/24 1522  BP: (!) 140/80  Pulse: 82  Temp: 36.6 C (97.9 F)  SpO2: 98%  Weight: 70.6 kg (155 lb 9.6 oz)  Height: 168.9 cm (5' 6.5)  PainSc: 0-No pain  PainLoc: Breast   Body mass index is 24.74 kg/m.  Physical Exam Vitals reviewed.  Constitutional:  Appearance: Normal appearance.  Chest:  Breasts: Right: No inverted nipple, mass or nipple discharge.  Left: No inverted nipple, mass or nipple discharge.  Lymphadenopathy:  Upper Body:  Right upper body: No supraclavicular or axillary adenopathy.  Left upper body: No supraclavicular or axillary adenopathy.  Neurological:  Mental Status: She is alert.    Assessment and Plan:   Breast calcifications  We spent some time discussing these calcifications. I am going to have our radiologist look at this to see if they think it would be amenable to another attempt at a biopsy first. Barring that we discussed a seed guided excisional biopsy of this area. We discussed the risks as well as recovery associated with this.

## 2024-01-29 NOTE — Transfer of Care (Signed)
 Immediate Anesthesia Transfer of Care Note  Patient: Melinda [name]  Procedure(s) Performed: EXCISION OF BREAST BIOPSY (Left: Breast)  Patient Location: PACU  Anesthesia Type:General  Level of Consciousness: awake and patient cooperative  Airway & Oxygen Therapy: Patient Spontanous Breathing and Patient connected to nasal cannula oxygen  Post-op Assessment: Report given to RN and Post -op Vital signs reviewed and stable  Post vital signs: Reviewed and stable  Last Vitals:  Vitals Value Taken Time  BP 99/58 01/29/24 08:30  Temp 36.2 C 01/29/24 08:30  Pulse 59 01/29/24 08:30  Resp 15 01/29/24 08:30  SpO2 99 % 01/29/24 08:30  Vitals shown include unfiled device data.  Last Pain:  Vitals:   01/29/24 0629  TempSrc: Oral  PainSc: 0-No pain      Patients Stated Pain Goal: 3 (01/29/24 9370)  Complications: No notable events documented.

## 2024-01-29 NOTE — Op Note (Signed)
 Preoperative diagnosis: Left breast calcifications with discordant biopsy Postoperative diagnosis: Same as above Procedure: Left breast radioactive seed guided excisional biopsy Surgeon: Dr. Adina Bury Anesthesia: General Estimated blood loss: Minimal Complications: None Drains: Specimens: 1.  Left breast excisional biopsy marked with paint containing seed 2.  Additional anterior margin marked short superior, long lateral, double deep Sponge needle count was correct completion Disposition recovery stable condition  Indications: This is 73 year old female who on mammography had a 7 mm area about 1 cm proximal to the nipple base that had some calcifications.  The pathology was benign but read is discordant.  I discussed this with the radiologist who did not think another biopsy was appropriate.  We discussed excisional biopsy and she had a radioactive seed placed in this region.  Procedure: After informed consent was obtained she was taken to the operating room.  She was given antibiotics.  SCDs were placed.  She was placed under anesthesia without complication.  She was prepped and draped in standard sterile surgical fashion.  Surgical timeout was then performed.  I located the seed in the retroareolar position.  I then infiltrated Marcaine throughout this area.  I made a periareolar incision in order to hide the scar later.  I then remove the seed and some of the surrounding tissue.  Mammogram confirmed removal of the seed.  I did remove some of the tissue anterior to this as well towards the nipple as it was very thick and fibrous and that is where most of the calcifications appeared to be.  I then obtained hemostasis.  I closed the breast tissue with 2-0 Vicryl.  The skin was closed with 3-0 Vicryl 5-0 Monocryl.  Glue and Steri-Strips were applied.  She tolerated this well was extubated transferred to recovery stable.

## 2024-01-29 NOTE — Interval H&P Note (Signed)
 History and Physical Interval Note:  01/29/2024 7:14 AM  Melinda [name]  has presented today for surgery, with the diagnosis of LEFT BREAST CALCIFICATION.  The various methods of treatment have been discussed with the patient and family. After consideration of risks, benefits and other options for treatment, the patient has consented to  Procedure(s) with comments: EXCISION OF BREAST BIOPSY (Left) - LEFT BREAST SEED GUIDED EXCISIONAL BIOPSY as a surgical intervention.  The patient's history has been reviewed, patient examined, no change in status, stable for surgery.  I have reviewed the patient's chart and labs.  Questions were answered to the patient's satisfaction.     Donnice Bury

## 2024-01-29 NOTE — Discharge Instructions (Addendum)
 [facility] Office Phone Number [phone]  POST OP INSTRUCTIONS Take 400 mg of ibuprofen every 8 hours or 650 mg tylenol  every 6 hours for next 72 hours then as needed. Use ice several times daily also.  A prescription for pain medication may be given to you upon discharge.  Take your pain medication as prescribed, if needed.  If narcotic pain medicine is not needed, then you may take acetaminophen  (Tylenol ), naprosyn (Alleve) or ibuprofen (Advil) as needed. Take your usually prescribed medications unless otherwise directed If you need a refill on your pain medication, please contact your pharmacy.  They will contact our office to request authorization.  Prescriptions will not be filled after 5pm or on week-ends. You should eat very light the first 24 hours after surgery, such as soup, crackers, pudding, etc.  Resume your normal diet the day after surgery. Most patients will experience some swelling and bruising in the breast.  Ice packs and a good support bra will help.  Wear the breast binder provided or a sports bra for 72 hours day and night.  After that wear a sports bra during the day until you return to the office. Swelling and bruising can take several days to resolve.  It is common to experience some constipation if taking pain medication after surgery.  Increasing fluid intake and taking a stool softener will usually help or prevent this problem from occurring.  A mild laxative (Milk of Magnesia or Miralax) should be taken according to package directions if there are no bowel movements after 48 hours. I used skin glue on the incision, you may shower in 24 hours.  The glue will flake off over the next 2-3 weeks.  Any sutures or staples will be removed at the office during your follow-up visit. ACTIVITIES:  You may resume regular daily activities (gradually increasing) beginning the next day.  Wearing a good support bra or sports bra minimizes pain and swelling.  You may have  sexual intercourse when it is comfortable. You may drive when you no longer are taking prescription pain medication, you can comfortably wear a seatbelt, and you can safely maneuver your car and apply brakes. RETURN TO WORK:  ______________________________________________________________________________________ Rosine should see your doctor in the office for a follow-up appointment approximately two weeks after your surgery.  Your doctor's nurse will typically make your follow-up appointment when she calls you with your pathology report.  Expect your pathology report 3-4 business days after your surgery.  You may call to check if you do not hear from us  after three days. OTHER INSTRUCTIONS: _______________________________________________________________________________________________ _____________________________________________________________________________________________________________________________________ _____________________________________________________________________________________________________________________________________ _____________________________________________________________________________________________________________________________________  WHEN TO CALL DR WAKEFIELD: Fever over 101.0 Nausea and/or vomiting. Extreme swelling or bruising. Continued bleeding from incision. Increased pain, redness, or drainage from the incision.  The clinic staff is available to answer your questions during regular business hours.  Please don't hesitate to call and ask to speak to one of the nurses for clinical concerns.  If you have a medical emergency, go to the nearest emergency room or call 911.  A surgeon from [facility] is always on call at the hospital.  For further questions, please visit [email or web address]  No Tylenol  before 12:30pm today. You had 5mg  of Oxycodone at 0940 today.  Post Anesthesia Home Care Instructions  Activity: Get plenty  of rest for the remainder of the day. A responsible individual must stay with you for 24 hours following the procedure.  For the next 24 hours, DO NOT: -Drive  a car -Advertising copywriter -Drink alcoholic beverages -Take any medication unless instructed by your physician -Make any legal decisions or sign important papers.  Meals: Start with liquid foods such as gelatin or soup. Progress to regular foods as tolerated. Avoid greasy, spicy, heavy foods. If nausea and/or vomiting occur, drink only clear liquids until the nausea and/or vomiting subsides. Call your physician if vomiting continues.  Special Instructions/Symptoms: Your throat may feel dry or sore from the anesthesia or the breathing tube placed in your throat during surgery. If this causes discomfort, gargle with warm salt water. The discomfort should disappear within 24 hours.  If you had a scopolamine  patch placed behind your ear for the management of post- operative nausea and/or vomiting:  1. The medication in the patch is effective for 72 hours, after which it should be removed.  Wrap patch in a tissue and discard in the trash. Wash hands thoroughly with soap and water. 2. You may remove the patch earlier than 72 hours if you experience unpleasant side effects which may include dry mouth, dizziness or visual disturbances. 3. Avoid touching the patch. Wash your hands with soap and water after contact with the patch.

## 2024-01-29 NOTE — Anesthesia Procedure Notes (Signed)
 Procedure Name: LMA Insertion Date/Time: 01/29/2024 7:42 AM  Performed by: Donnell Berwyn SQUIBB, CRNAPre-anesthesia Checklist: Patient identified, Emergency Drugs available, Suction available, Patient being monitored and Timeout performed Patient Re-evaluated:Patient Re-evaluated prior to induction Oxygen Delivery Method: Circle system utilized Preoxygenation: Pre-oxygenation with 100% oxygen Induction Type: IV induction Ventilation: Mask ventilation without difficulty LMA: LMA inserted LMA Size: 4.0 Number of attempts: 1 Placement Confirmation: positive ETCO2 and breath sounds checked- equal and bilateral Tube secured with: Tape Dental Injury: Teeth and Oropharynx as per pre-operative assessment

## 2024-01-30 ENCOUNTER — Encounter (HOSPITAL_BASED_OUTPATIENT_CLINIC_OR_DEPARTMENT_OTHER): Payer: Self-pay | Admitting: General Surgery

## 2024-01-30 LAB — SURGICAL PATHOLOGY

## 2024-02-01 ENCOUNTER — Encounter (HOSPITAL_BASED_OUTPATIENT_CLINIC_OR_DEPARTMENT_OTHER): Payer: Self-pay | Admitting: General Surgery

## 2024-02-01 ENCOUNTER — Ambulatory Visit: Payer: Self-pay | Admitting: Physician Assistant

## 2024-02-01 NOTE — OR Nursing (Signed)
 Late entry:  Due to an error in the changes to procedure names involving radioactive seeds, this documented procedure was incorrect at the time of the surgery. I've corrected the procedure completed to match the Operative note dictated by the surgeon.  Berwyn Eagles, RN.

## 2024-02-02 ENCOUNTER — Ambulatory Visit: Admitting: Student

## 2024-04-22 ENCOUNTER — Telehealth: Payer: Self-pay | Admitting: Pharmacy Technician

## 2024-04-22 NOTE — Telephone Encounter (Signed)
 Auth Submission: PENDING Site of care: Site of care: [facility] Payer: [employer] Medication & CPT/J Code(s) submitted: Prolia  (Denosumab ) [id number] Diagnosis Code: M81.0 Route of submission (phone, fax, portal):  Phone # Fax # Auth type: Buy/Bill PB Units/visits requested: 60MG  [id number] Reference number:  Approval from:  to

## 2024-04-23 ENCOUNTER — Encounter: Payer: Self-pay | Admitting: Sports Medicine

## 2024-05-06 ENCOUNTER — Ambulatory Visit

## 2024-05-06 ENCOUNTER — Telehealth: Payer: Self-pay

## 2024-05-06 VITALS — BP 156/84 | HR 68 | Temp 98.2°F | Resp 16 | Ht 66.25 in | Wt 157.6 lb

## 2024-05-06 MED ORDER — DENOSUMAB 60 MG/ML ~~LOC~~ SOSY: 60.0000 mg | PREFILLED_SYRINGE | Freq: Once | SUBCUTANEOUS | Status: DC

## 2024-05-06 NOTE — Progress Notes (Deleted)
 Patient presented for Prolia  injection appointment today. Patient stated she had a root canal extraction performed within the past week. Called dental provider Dr. Hermelinda office ([facility] at [phone]) and spoke with Dawna with front desk staff. Dawna spoke directly with Dr. Geanie, who stated that the patient was clear to receive the Prolia  injection today as long as she was not experiencing any swelling, throbbing, or extreme discomfort at the extraction site. Relayed information to patient, who denied having those symptoms, but stated that she would prefer to defer appointment into October to give herself additional time to heal. New appointment made for October 24th at 11:00 am per patient request.  Melinda FORBES Sar, RN

## 2024-05-06 NOTE — Telephone Encounter (Signed)
 Patient presented for Prolia  injection appointment today. Patient stated she had a root canal extraction performed within the past week. Called dental provider Dr. Hermelinda office ([facility] at [phone]) and spoke with Dawna with front desk staff. Dawna spoke directly with Dr. Geanie, who stated that the patient was clear to receive the Prolia  injection today as long as she was not experiencing any swelling, throbbing, or extreme discomfort at the extraction site. Relayed information to patient, who denied having those symptoms, but stated that she would prefer to defer appointment into October to give herself additional time to heal. New appointment made for October 24th at 11:00 am per patient request.  Melinda FORBES Sar, RN

## 2024-05-31 ENCOUNTER — Ambulatory Visit

## 2024-05-31 VITALS — BP 148/83 | HR 63 | Temp 97.7°F | Resp 16 | Ht 66.25 in | Wt 157.8 lb

## 2024-05-31 DIAGNOSIS — M810 Age-related osteoporosis without current pathological fracture: Secondary | ICD-10-CM

## 2024-05-31 MED ORDER — DENOSUMAB 60 MG/ML ~~LOC~~ SOSY: 60.0000 mg | PREFILLED_SYRINGE | Freq: Once | SUBCUTANEOUS | Status: DC

## 2024-05-31 MED ORDER — DENOSUMAB 60 MG/ML ~~LOC~~ SOSY
60.0000 mg | PREFILLED_SYRINGE | Freq: Once | SUBCUTANEOUS | Status: AC
  Administered 2024-05-31: 60 mg via SUBCUTANEOUS

## 2024-05-31 NOTE — Progress Notes (Signed)
 Diagnosis: Osteoporosis  Provider:  Mannam, Praveen MD  Procedure: Injection  Prolia  (Denosumab ), Dose: 60 mg, Site: subcutaneous, Number of injections: 1  Injection Site(s): Right upper quad. gluteus  Post Care: Patient declined observation  Discharge: Condition: Good, Destination: Home . AVS Declined  Performed by:  Fredrich Cory G Pilkington-Burchett, RN

## 2024-06-15 LAB — COLOGUARD
COLOGUARD: NEGATIVE
Cologuard: NEGATIVE

## 2024-06-28 ENCOUNTER — Encounter: Payer: Self-pay | Admitting: Physician Assistant

## 2024-07-01 ENCOUNTER — Encounter: Payer: Self-pay | Admitting: Physician Assistant

## 2024-09-12 NOTE — Telephone Encounter (Signed)
 Copied from CRM #[id number]. Topic: Appointments - Scheduling Inquiry for Clinic >> Sep 11, 2024  4:49 PM Roselie BROCKS wrote: Reason for CRM: Patient has spoken to Darice Brasil over [facility], about making patients AWV that is scheduled for 09-30-24 the same time as her physical on 10-14-24, per scheduling there is no availability on 10-14-24 for AWV, and patient would request another call from Darice Brasil concerning this.

## 2024-09-19 ENCOUNTER — Ambulatory Visit: Payer: Medicare HMO

## 2024-09-30 ENCOUNTER — Ambulatory Visit

## 2024-10-14 ENCOUNTER — Encounter: Admitting: Physician Assistant

## 2024-11-15 ENCOUNTER — Ambulatory Visit: Admitting: Cardiovascular Disease

## 2024-11-18 ENCOUNTER — Ambulatory Visit

## 2024-11-21 ENCOUNTER — Ambulatory Visit: Admitting: Cardiology
